# Patient Record
Sex: Female | Born: 1990
Health system: Southern US, Community
[De-identification: ages and names within clinical notes are randomized; demographics above are authoritative.]

## PROBLEM LIST (undated history)

## (undated) DIAGNOSIS — I272 Pulmonary hypertension, unspecified: Secondary | ICD-10-CM

## (undated) DIAGNOSIS — F1191 Opioid use, unspecified, in remission: Secondary | ICD-10-CM

## (undated) DIAGNOSIS — R0602 Shortness of breath: Secondary | ICD-10-CM

## (undated) DIAGNOSIS — I517 Cardiomegaly: Secondary | ICD-10-CM

## (undated) DIAGNOSIS — E042 Nontoxic multinodular goiter: Secondary | ICD-10-CM

## (undated) DIAGNOSIS — F419 Anxiety disorder, unspecified: Secondary | ICD-10-CM

## (undated) DIAGNOSIS — I509 Heart failure, unspecified: Secondary | ICD-10-CM

## (undated) DIAGNOSIS — F1190 Opioid use, unspecified, uncomplicated: Secondary | ICD-10-CM

## (undated) DIAGNOSIS — Z720 Tobacco use: Secondary | ICD-10-CM

## (undated) DIAGNOSIS — N179 Acute kidney failure, unspecified: Secondary | ICD-10-CM

## (undated) DIAGNOSIS — Z8616 Personal history of COVID-19: Secondary | ICD-10-CM

## (undated) DIAGNOSIS — F32A Depression, unspecified: Secondary | ICD-10-CM

## (undated) DIAGNOSIS — R768 Other specified abnormal immunological findings in serum: Secondary | ICD-10-CM

## (undated) DIAGNOSIS — G43909 Migraine, unspecified, not intractable, without status migrainosus: Secondary | ICD-10-CM

## (undated) DIAGNOSIS — K219 Gastro-esophageal reflux disease without esophagitis: Secondary | ICD-10-CM

## (undated) DIAGNOSIS — E039 Hypothyroidism, unspecified: Secondary | ICD-10-CM

## (undated) DIAGNOSIS — F1990 Other psychoactive substance use, unspecified, uncomplicated: Secondary | ICD-10-CM

## (undated) DIAGNOSIS — F1121 Opioid dependence, in remission: Secondary | ICD-10-CM

## (undated) DIAGNOSIS — I445 Left posterior fascicular block: Secondary | ICD-10-CM

## (undated) DIAGNOSIS — R0600 Dyspnea, unspecified: Secondary | ICD-10-CM

## (undated) HISTORY — DX: Cardiomegaly: I51.7

## (undated) HISTORY — DX: Shortness of breath: R06.02

## (undated) HISTORY — DX: Hypothyroidism, unspecified: E03.9

## (undated) HISTORY — DX: Personal history of COVID-19: Z86.16

## (undated) HISTORY — DX: Acute kidney failure, unspecified: N17.9

## (undated) HISTORY — PX: WISDOM TOOTH EXTRACTION: SHX21

## (undated) HISTORY — DX: Other psychoactive substance use, unspecified, uncomplicated: F19.90

## (undated) HISTORY — DX: Opioid use, unspecified, uncomplicated: F11.90

## (undated) HISTORY — DX: Pulmonary hypertension, unspecified: I27.20

## (undated) HISTORY — DX: Heart failure, unspecified: I50.9

---
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
## 2010-02-16 ENCOUNTER — Emergency Department (HOSPITAL_COMMUNITY): Admission: EM | Admit: 2010-02-16 | Discharge: 2010-02-16 | Payer: Self-pay | Admitting: Emergency Medicine

## 2019-06-09 DIAGNOSIS — Z8616 Personal history of COVID-19: Secondary | ICD-10-CM

## 2019-06-09 HISTORY — DX: Personal history of COVID-19: Z86.16

## 2020-06-11 ENCOUNTER — Encounter (HOSPITAL_BASED_OUTPATIENT_CLINIC_OR_DEPARTMENT_OTHER): Payer: Self-pay | Admitting: Emergency Medicine

## 2020-06-11 ENCOUNTER — Other Ambulatory Visit (HOSPITAL_BASED_OUTPATIENT_CLINIC_OR_DEPARTMENT_OTHER): Payer: Self-pay | Admitting: Emergency Medicine

## 2020-06-11 ENCOUNTER — Other Ambulatory Visit: Payer: Self-pay

## 2020-06-11 ENCOUNTER — Emergency Department (HOSPITAL_BASED_OUTPATIENT_CLINIC_OR_DEPARTMENT_OTHER)
Admission: EM | Admit: 2020-06-11 | Discharge: 2020-06-11 | Disposition: A | Discharge status: Home / Self Care | Payer: Medicaid Other | Attending: Emergency Medicine | Admitting: Emergency Medicine

## 2020-06-11 ENCOUNTER — Emergency Department (HOSPITAL_BASED_OUTPATIENT_CLINIC_OR_DEPARTMENT_OTHER): Payer: Medicaid Other

## 2020-06-11 DIAGNOSIS — I10 Essential (primary) hypertension: Secondary | ICD-10-CM

## 2020-06-11 DIAGNOSIS — F17200 Nicotine dependence, unspecified, uncomplicated: Secondary | ICD-10-CM | POA: Insufficient documentation

## 2020-06-11 DIAGNOSIS — R0602 Shortness of breath: Secondary | ICD-10-CM | POA: Insufficient documentation

## 2020-06-11 DIAGNOSIS — R9431 Abnormal electrocardiogram [ECG] [EKG]: Secondary | ICD-10-CM | POA: Insufficient documentation

## 2020-06-11 DIAGNOSIS — Z79899 Other long term (current) drug therapy: Secondary | ICD-10-CM | POA: Diagnosis not present

## 2020-06-11 LAB — PREGNANCY, URINE: Preg Test, Ur: NEGATIVE

## 2020-06-11 LAB — BASIC METABOLIC PANEL
Anion gap: 13 (ref 5–15)
BUN: 26 mg/dL — ABNORMAL HIGH (ref 6–20)
CO2: 22 mmol/L (ref 22–32)
Calcium: 8.6 mg/dL — ABNORMAL LOW (ref 8.9–10.3)
Chloride: 101 mmol/L (ref 98–111)
Creatinine, Ser: 1.1 mg/dL — ABNORMAL HIGH (ref 0.44–1.00)
GFR, Estimated: >60 mL/min (ref >60)
Glucose, Bld: 98 mg/dL (ref 70–99)
Potassium: 4.6 mmol/L (ref 3.5–5.1)
Sodium: 136 mmol/L (ref 135–145)

## 2020-06-11 LAB — CBC
HCT: 49.6 % — ABNORMAL HIGH (ref 36.0–46.0)
Hemoglobin: 16.4 g/dL — ABNORMAL HIGH (ref 12.0–15.0)
MCH: 29.7 pg (ref 26.0–34.0)
MCHC: 33.1 g/dL (ref 30.0–36.0)
MCV: 89.7 fL (ref 80.0–100.0)
Platelets: 154 10*3/uL (ref 150–400)
RBC: 5.53 MIL/uL — ABNORMAL HIGH (ref 3.87–5.11)
RDW: 13.7 % (ref 11.5–15.5)
WBC: 11.5 10*3/uL — ABNORMAL HIGH (ref 4.0–10.5)
nRBC: 0 % (ref 0.0–0.2)

## 2020-06-11 LAB — TROPONIN I (HIGH SENSITIVITY): Troponin I (High Sensitivity): 10 ng/L (ref <18)

## 2020-06-11 MED ORDER — LOSARTAN POTASSIUM 25 MG PO TABS: 25.0000 mg | ORAL_TABLET | Freq: Every day | ORAL | 0 refills | Status: DC

## 2020-06-11 MED FILL — LOSARTAN POTASSIUM 25 MG TA: 25 | 30 days supply | Qty: 30 | Fill #0

## 2020-06-11 NOTE — Discharge Instructions (Signed)
Please read and follow all provided instructions.  Your diagnoses today include:  1. Hypertension, unspecified type   2. Shortness of breath   3. Abnormal EKG     Your blood pressure was high today (BP): BP (!) 168/137   Pulse 96   Temp 97.7 F (36.5 C) (Oral)   Resp 18   Ht 5\' 5"  (1.651 m)   Wt 81.6 kg   SpO2 93%   BMI 29.95 kg/m   Tests performed today include:  Vital signs. See below for your results today.  Blood cell counts (white, red, and platelets) Electrolytes  Kidney function test - upper normal EKG - shows changes Chest x-ray - shows possible signs of enlarged blood vessels going to the lungs, no pneumonia or fluid build-up  Medications prescribed:   Lisinopril - blood pressure medication  Home care instructions:  Follow any educational materials contained in this packet.  Follow-up instructions: Please follow-up with your primary care provider in the next 7 days for a recheck of your symptoms and blood pressure.    You should also follow-up with the cardiologist listed for evaluation of your shortness of breath.  You do have abnormalities on EKG and chest x-ray which should be further evaluated.   Return instructions:   Please return to the Emergency Department if you experience worsening symptoms.   Return with severe chest pain, abdominal pain, or shortness of breath.   Return with severe headache, focal weakness, numbness, difficulty with speech or vision.  Return with loss of vision or sudden vision change.  Please return if you have any other emergent concerns.  Additional Information:  Your vital signs today were: BP (!) 168/137   Pulse 96   Temp 97.7 F (36.5 C) (Oral)   Resp 18   Ht 5\' 5"  (1.651 m)   Wt 81.6 kg   SpO2 93%   BMI 29.95 kg/m  If your blood pressure (BP) was elevated above 135/85 this visit, please have this repeated by your doctor within one month. --------------

## 2020-06-11 NOTE — ED Triage Notes (Signed)
Reports she went to the dentist and was told her bp was high and she needed to come to the ED.  Reports its been high for quite a while.  Was there for dental pain.  Reports it has been high since having covid last year.  Also reports extreme SOB since having covid.

## 2020-06-11 NOTE — ED Provider Notes (Signed)
[facility] EMERGENCY DEPARTMENT Provider Note   CSN: [id number] Arrival date & time: 06/11/20  1333     History Chief Complaint  Patient presents with  . Hypertension    Zoe Roach is a 29 y.o. female.  Patient with her most history of IVDU, hypothroidism presents the emergency department from her dentist office today for evaluation of high blood pressure.  Patient was there for procedure today and was noted to have a high blood pressure reading.  She was then referred to the emergency department.  Patient denies any acute symptoms today.  Patient does however endorse a history of shortness of breath.  Patient had Covid about 1 year ago.  She did not require hospitalization.  She states about a month after having Covid, she had shortness of breath that is worse with exertion.  This waxes and wanes but is not significantly different or worse today.  She denies associated chest pain.  She states that her blood pressures have been elevated since that time as well.  She has seen a telemedicine doctor who has evaluated the patient and evaluated thyroid.  Patient has not had any blood pressure monitoring or treatment per her report.  No fever, cough, N/V/D.         History reviewed. No pertinent past medical history.  There are no problems to display for this patient.   Past Surgical History:  Procedure Laterality Date  . CESAREAN SECTION       OB History   No obstetric history on file.     No family history on file.  Social History   Tobacco Use  . Smoking status: Current Some Day Smoker  . Smokeless tobacco: Never Used  Substance Use Topics  . Alcohol use: Yes    Comment: occasionally  . 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 Authorizing Provider  amphetamine-dextroamphetamine (ADDERALL) 15 MG tablet Take 1 tablet by mouth  daily. 05/13/20  Yes [provider]  HYDROcodone-acetaminophen (NORCO) 10-325 MG tablet Take 0.5 tablets by mouth 4 (four) times daily as needed for pain. 06/08/20  Yes [provider]  ibuprofen (ADVIL) 800 MG tablet Take 400 mg by mouth 3 (three) times daily. 05/20/20  Yes [provider]  lansoprazole (PREVACID) 15 MG capsule Take 15 mg by mouth daily. 05/24/20  Yes [provider]  levonorgestrel (MIRENA) 20 MCG/24HR IUD 1 each by Intrauterine route once.   Yes [provider]  levothyroxine (SYNTHROID) 25 MCG tablet Take 12.5 mcg by mouth See admin instructions. Take 12.9mcg(0.5 tab) once daily with tablet. Total daily dose=62.87mcg 05/12/20  Yes [provider]  levothyroxine (SYNTHROID) 50 MCG tablet Take 50 mcg by mouth See admin instructions. Take one once daily with 0.5 tablet of the tablet. Total daily dose=62.84mcg 05/24/20  Yes [provider]    Allergies    Patient has no allergy information on record.  Review of Systems   Review of Systems  Constitutional: Negative for diaphoresis and fever.  Eyes: Negative for redness.  Respiratory: Positive for shortness of breath. Negative for cough.   Cardiovascular: Negative for chest pain, palpitations and leg swelling.  Gastrointestinal: Negative for abdominal pain, nausea and vomiting.  Genitourinary: Negative for dysuria.  Musculoskeletal: Negative for back pain and neck pain.  Skin: Negative for rash.  Neurological:  Negative for syncope and light-headedness.  Psychiatric/Behavioral: The patient is not nervous/anxious.     Physical Exam Updated Vital Signs BP (!) 159/129 (BP Location: Right Arm)   Pulse (!) 101   Temp 97.7 F (36.5 C) (Oral)   Resp 18   Ht 5\' 5"  (1.651 m)   Wt 81.6 kg   SpO2 98%   BMI 29.95 kg/m   Physical Exam Vitals and nursing note reviewed.  Constitutional:      General: She is not in acute distress.    Appearance: She  is well-developed.  HENT:     Head: Normocephalic and atraumatic.     Right Ear: External ear normal.     Left Ear: External ear normal.     Nose: Nose normal.  Eyes:     Conjunctiva/sclera: Conjunctivae normal.  Cardiovascular:     Rate and Rhythm: Normal rate and regular rhythm. Occasional extrasystoles are present.    Heart sounds: No murmur heard.      Comments: No murmur. Pulmonary:     Effort: No respiratory distress.     Breath sounds: No wheezing, rhonchi or rales.  Abdominal:     Palpations: Abdomen is soft.     Tenderness: There is no abdominal tenderness. There is no guarding or rebound.  Musculoskeletal:     Cervical back: Normal range of motion and neck supple.     Right lower leg: No edema.     Left lower leg: No edema.  Skin:    General: Skin is warm and dry.     Findings: No rash.  Neurological:     General: No focal deficit present.     Mental Status: She is alert. Mental status is at baseline.     Motor: No weakness.  Psychiatric:        Mood and Affect: Mood normal.     ED Results / Procedures / Treatments   Labs (all labs ordered are listed, but only abnormal results are displayed) Labs Reviewed  BASIC METABOLIC PANEL - Abnormal; Notable for the following components:      Result Value   BUN 26 (*)    Creatinine, Ser 1.10 (*)    Calcium 8.6 (*)    All other components within normal limits  CBC - Abnormal; Notable for the following components:   WBC 11.5 (*)    RBC 5.53 (*)    Hemoglobin 16.4 (*)    HCT 49.6 (*)    All other components within normal limits  PREGNANCY, URINE  TROPONIN I (HIGH SENSITIVITY)  TROPONIN I (HIGH SENSITIVITY)    ED ECG REPORT   Date: 06/11/2020  Rate: 105  Rhythm: sinus tachycardia, PAC  QRS Axis: right  Intervals: normal  ST/T Wave abnormalities: nonspecific ST/T changes  Conduction Disutrbances:none  Narrative Interpretation:   Old EKG Reviewed: none available  I have personally reviewed the EKG tracing  and agree with the computerized printout as noted.   Radiology DG Chest 2 View  Result Date: 06/11/2020 CLINICAL DATA:  Elevated blood pressure for a few days, hypertension EXAM: CHEST - 2 VIEW COMPARISON:  None FINDINGS: Upper normal heart size. Enlargement of LEFT hilum/AP window, favor central pulmonary arterial enlargement though difficult to completely exclude LEFT hilar adenopathy. Mediastinal contours otherwise normal. Peribronchial thickening. No infiltrate, pleural effusion, or pneumothorax. Osseous structures unremarkable. IMPRESSION: Enlargement of LEFT hilum/AP window, favor representing central pulmonary arterial enlargement question pulmonary arterial hypertension, though difficult to completely exclude LEFT hilar adenopathy. Consider CT chest  to exclude hilar adenopathy. Electronically Signed   By: Ulyses Southward M.D.   On: 06/11/2020 14:15    Procedures Procedures (including critical care time)  Medications Ordered in ED Medications - No data to display  ED Course  I have reviewed the triage vital signs and the nursing notes.  Pertinent labs & imaging results that were available during my care of the patient were reviewed by me and considered in my medical decision making (see chart for details).  Patient seen and examined.  She is here today due to her blood pressure being high at the dentist office.  Patient does have other concerning symptoms which have been ongoing for almost a year which she will need to have evaluated.  I discussed results with patient at bedside including chest x-ray abnormalities, EKG abnormalities.  Her symptoms are bothersome, but not acutely worsening.  I do not feel that she is having any acute endorgan damage which would require further evaluation at this time.  I will ambulate the patient to ensure that she does not become hypoxic with ambulation given her exertional shortness of breath.  We discussed at length need to follow-up with PCP to continue  titration of blood pressure medication and management.  I also think given abnormal EKG, signs of right ventricular hypertrophy and right heart strain she may benefit from cardiology evaluation and possible echocardiogram if they feel that this is warranted.  Vital signs reviewed and are as follows: BP (!) 159/129 (BP Location: Right Arm)   Pulse (!) 101   Temp 97.7 F (36.5 C) (Oral)   Resp 18   Ht 5\' 5"  (1.651 m)   Wt 81.6 kg   SpO2 98%   BMI 29.95 kg/m   3:59 PM She ambulated with O2 sat of 92%.   Plan for d/c as above.      MDM Rules/Calculators/A&P                          Patient here for BP elevation today. Sounds like this has been ongoing for about a year. Initiated tx with lisinopril today, will need PCP follow-up.  SOB, chronic for past year. Findings as above. This is not acute today. Do not feel this is due to pulmonary edema or fluid overload. Would likely benefit for cards/PCP eval given hypertrophy suggested on EKG and possible pulmonary artery hypertension noted on chest x-ray.   Final Clinical Impression(s) / ED Diagnoses Final diagnoses:  Hypertension, unspecified type  Shortness of breath  Abnormal EKG    Rx / DC Orders ED Discharge Orders         Ordered    losartan (COZAAR) 25 MG tablet  Daily        06/11/20 1556           06/13/20, PA-C 06/11/20 1605    06/13/20, MD 06/13/20 1504

## 2020-06-11 NOTE — ED Notes (Signed)
Ambulatory pulse ox reading baseline 95% HR 98, Decreased to 92% HR 100, no acute distress, pt does report mild increase in work of breathing

## 2020-06-15 ENCOUNTER — Other Ambulatory Visit: Payer: Self-pay

## 2020-06-15 ENCOUNTER — Ambulatory Visit: Payer: Medicaid Other | Admitting: Cardiology

## 2020-06-15 ENCOUNTER — Encounter: Payer: Self-pay | Admitting: Cardiology

## 2020-06-15 VITALS — BP 142/110 | HR 105 | Ht 65.0 in | Wt 190.0 lb

## 2020-06-15 DIAGNOSIS — I10 Essential (primary) hypertension: Secondary | ICD-10-CM

## 2020-06-15 DIAGNOSIS — R079 Chest pain, unspecified: Secondary | ICD-10-CM | POA: Diagnosis not present

## 2020-06-15 DIAGNOSIS — R9431 Abnormal electrocardiogram [ECG] [EKG]: Secondary | ICD-10-CM

## 2020-06-15 MED ORDER — DILTIAZEM HCL ER COATED BEADS 180 MG PO CP24: 180.0000 mg | ORAL_CAPSULE | Freq: Every day | ORAL | 3 refills | Status: DC

## 2020-06-15 NOTE — Addendum Note (Signed)
Addended by: Theresia Majors on: 06/15/2020 02:36 PM   Modules accepted: Orders

## 2020-06-15 NOTE — Patient Instructions (Signed)
Medication Instructions:  Your physician has recommended you make the following change in your medication:  1) START taking diltiazem 180 mg daily  *If you need a refill on your cardiac medications before your next appointment, please call your pharmacy*   Lab Work: TODAY: TSH, renin, PRA, aldosterone, 24 hour urine If you have labs (blood work) drawn today and your tests are completely normal, you will receive your results only by: Marland Kitchen MyChart Message (if you have MyChart) OR . A paper copy in the mail If you have any lab test that is abnormal or we need to change your treatment, we will call you to review the results.   Testing/Procedures: Your physician has requested that you have an echocardiogram. Echocardiography is a painless test that uses sound waves to create images of your heart. It provides your doctor with information about the size and shape of your heart and how well your heart's chambers and valves are working. This procedure takes approximately one hour. There are no restrictions for this procedure.  Your physician has requested that you have a CTA scan.   Your physician has requested that you have a renal artery duplex. During this test, an ultrasound is used to evaluate blood flow to the kidneys. Allow one hour for this exam. Do not eat after midnight the day before and avoid carbonated beverages. Take your medications as you usually do.  Follow-Up: At [facility], you and your health needs are our priority.  As part of our continuing mission to provide you with exceptional heart care, we have created designated Provider Care Teams.  These Care Teams include your primary Cardiologist (physician) and Advanced Practice Providers (APPs -  Physician Assistants and Nurse Practitioners) who all work together to provide you with the care you need, when you need it.  We recommend signing up for the patient portal called "MyChart".  Sign up information is provided on this After  Visit Summary.  MyChart is used to connect with patients for Virtual Visits (Telemedicine).  Patients are able to view lab/test results, encounter notes, upcoming appointments, etc.  Non-urgent messages can be sent to your provider as well.   To learn more about what you can do with MyChart, go to [email or web address].    Follow up with APP in 1 week.

## 2020-06-15 NOTE — Addendum Note (Signed)
Addended by: Tonita Phoenix on: 06/15/2020 02:49 PM   Modules accepted: Orders

## 2020-06-15 NOTE — Addendum Note (Signed)
Addended by: Theresia Majors on: 06/15/2020 02:08 PM   Modules accepted: Orders

## 2020-06-15 NOTE — Progress Notes (Signed)
Cardiology Office Note    Date:  06/15/2020   ID:  Zoe Roach, DOB 1990-11-27, MRN [id number]  PCP:  Zoe Able, MD  Cardiologist:  Zoe Magic, MD   Chief Complaint  Patient presents with  . New Patient (Initial Visit)    abnormal EKG and HTN    History of Present Illness:  Zoe Roach is a 29 y.o. female who is being seen today for the evaluation of HTN and abnormal EKG at the request of Zoe Able, MD.  This is a 30yo female with a hx of IVDU, hypothyroidism and COVID 19 who is referred for evaluation of HTN. She was seen in the ER after she was at the dentist office and was dx with elevated Bp and sent to the ER for further evaluation.  She was completely asymptomatic at the time. BP was 159/154mmHg.  Cxray showed possible pulmonary HTN vs .hilar adenopathy. Her EKG showed NSR with RVH and right heart strain and is referred to Cardiology for further evaluation.  Labs showed a mildly elevated SCr at 1.10, WBC 11.5 and Hbg 16.4.  She was started on Losartan 25mg  daily but after 2 doses her face swelled up.  She tells me that she has had problems with DOE for the past year ever since having COVID 19 a year ago and her SOB has persisted.  She never sought medical attention for the COVID.  Over the past few weeks her SOB has gotten much worse and her BP has been persistently elevated.  She has noticed some pressure in her chest but it feels more like her lungs when her breathing gets labored.  She has had some LE edema and hand edema but may related to diet.  Recently she has had a few episodes of PND and orthopnea in the last few nights.  She says that her heart seems to beat fast around 100bpm all the time.   No dizziness or syncope. She has been extremely fatigued recently as well.    Past Medical History:  Diagnosis Date  . History of COVID-19   . Hypothyroidism (acquired)   . IVDU (intravenous drug user)   . SOB (shortness of breath)     Past Surgical History:    Procedure Laterality Date  . CESAREAN SECTION      Current Medications: Current Meds  Medication Sig  . HYDROcodone-acetaminophen (NORCO) 10-325 MG tablet Take 0.5 tablets by mouth 4 (four) times daily as needed for pain.  ibuprofen (ADVIL) 800 MG tablet Take 400 mg by mouth 3 (three) times daily.  . lansoprazole (PREVACID) 15 MG capsule Take 15 mg by mouth daily.  Marland Kitchen levonorgestrel (MIRENA) 20 MCG/24HR IUD 1 each by Intrauterine route once.  Marland Kitchen levothyroxine (SYNTHROID) 25 MCG tablet Take 12.5 mcg by mouth See admin instructions. Take 12.34mcg(0.5 tab) once daily with 4m tablet. Total daily dose=62.27mcg  . levothyroxine (SYNTHROID) 50 MCG tablet Take 50 mcg by mouth See admin instructions. Take one 4m once daily with 0.5 tablet of the tablet. Total daily dose=62.65mcg    Allergies:   Losartan   Social History   Socioeconomic History  . Marital status: Married    Spouse name: Not on file  . Number of children: Not on file  . Years of education: Not on file  . Highest education level: Not on file  Occupational History  . Not on file  Tobacco Use  . Smoking status: Current Some Day Smoker  . Smokeless tobacco:  Never Used  Substance and Sexual Activity  . Alcohol use: Yes    Comment: occasionally  . 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 Sexual activity: Not on file  Other Topics Concern  . Not on file  Social History Narrative  . Not on file   Social Determinants of Health   Financial Resource Strain:   . Difficulty of Paying Living Expenses: Not on file  Food Insecurity:   . Worried About Programme researcher, broadcasting/film/video in the Last Year: Not on file  . Ran Out of Food in the Last Year: Not on file  Transportation Needs:   . Lack of Transportation (Medical): Not on file  . Lack of Transportation (Non-Medical): Not on file  Physical Activity:   . Days of Exercise per Week: Not on file  . Minutes of Exercise per Session: Not on file   Stress:   . Feeling of Stress : Not on file  Social Connections:   . Frequency of Communication with Friends and Family: Not on file  . Frequency of Social Gatherings with Friends and Family: Not on file  . Attends Religious Services: Not on file  . Active Member of Clubs or Organizations: Not on file  . Attends Banker Meetings: Not on file  . Marital Status: Not on file     Family History:  The patient's family history is not on file.   ROS:   Please see the history of present illness.    ROS All other systems reviewed and are negative.  No flowsheet data found.     PHYSICAL EXAM:   VS:  BP (!) 142/110   Pulse (!) 105   Ht 5\' 5"  (1.651 m)   Wt 190 lb (86.2 kg)   SpO2 96%   BMI 31.62 kg/m    GEN: Well nourished, well developed, in no acute distress  HEENT: normal  Neck: no JVD, carotid bruits, or masses Cardiac: regular but tachycardic; no murmurs, rubs, or gallops,no edema.  Intact distal pulses bilaterally.  Respiratory:  clear to auscultation bilaterally, normal work of breathing GI: soft, nontender, nondistended, + BS MS: no deformity or atrophy  Skin: warm and dry, no rash Neuro:  Alert and Oriented x 3, Strength and sensation are intact Psych: euthymic mood, full affect  Wt Readings from Last 3 Encounters:  06/15/20 190 lb (86.2 kg)  06/11/20 180 lb (81.6 kg)      Studies/Labs Reviewed:   EKG:  EKG is not ordered today.   Recent Labs: 06/11/2020: BUN 26; Creatinine, Ser 1.10; Hemoglobin 16.4; Platelets 154; Potassium 4.6; Sodium 136   Lipid Panel No results found for: CHOL, TRIG, HDL, CHOLHDL, VLDL, LDLCALC, LDLDIRECT  Additional studies/ records that were reviewed today include:  OV notes from PCP  ASSESSMENT:    1. Benign essential HTN   2. Nonspecific abnormal electrocardiogram (ECG) (EKG)      PLAN:  In order of problems listed above:  1. HTN -poorly controlled -she had been on Adderall but is off this now -check  TSH -check for secondary causes of given her young age including renal dopplers to rule out RAS, 24 hour urine for catecholamines/metanephrines/VMA/dopamine/cortisol -check aldosterone, renin and PRA -start Cardizem CD 180mg  daily>>will avoid BB for now while HTN workup in progress which could affect some of the results.  Also cannot use ARB/ACE I due to hx of facial edema with Losartan  2.  Abnormal EKG -EKG in ER showed RVH with nonspecific ST abnormaity -Cxray showed possible enlarged pulmonary artery with pulmonary HTN -check 2D echo to assess LV and RV size and function and rule out pulmonary HTN  3.  SOB -unclear etiology but has been persistent since COVID 19 a year ago and now worse along with fatigue and persistent tachycardia -I am concerned about possible PE with tachycardia and worsening SOB -check stat Chest CTA given her recent abnormal Cxray with possible PHTN vs. Hilar adenopathy -her WBC was mildly elevated and along with possible hilar adenopathy and severe fatigue, need to rule out more worrisome dx such as lymphoma   Medication Adjustments/Labs and Tests Ordered: Current medicines are reviewed at length with the patient today.  Concerns regarding medicines are outlined above.  Medication changes, Labs and Tests ordered today are listed in the Patient Instructions below.  There are no Patient Instructions on file for this visit.   Signed, Zoe Magic, MD  06/15/2020 1:28 PM    [facility] [address] [place], [place], [place]  [address] Phone: [phone]; Fax: [phone]

## 2020-06-15 NOTE — Addendum Note (Signed)
Addended by: Theresia Majors on: 06/15/2020 02:48 PM   Modules accepted: Orders

## 2020-06-16 ENCOUNTER — Ambulatory Visit (HOSPITAL_COMMUNITY)
Admission: RE | Admit: 2020-06-16 | Discharge: 2020-06-16 | Disposition: A | Discharge status: Home / Self Care | Payer: Medicaid Other | Source: Ambulatory Visit | Attending: Cardiology | Admitting: Cardiology

## 2020-06-16 ENCOUNTER — Telehealth: Payer: Self-pay | Admitting: Cardiology

## 2020-06-16 DIAGNOSIS — R079 Chest pain, unspecified: Secondary | ICD-10-CM | POA: Insufficient documentation

## 2020-06-16 MED ORDER — IOHEXOL 350 MG/ML SOLN
75.0000 mL | Freq: Once | INTRAVENOUS | Status: AC | PRN
  Administered 2020-06-16: 75 mL via INTRAVENOUS

## 2020-06-16 NOTE — Telephone Encounter (Signed)
I spoke with patient. She took Cardizem last evening around 5 PM.  At 10:30 PM she noticed swelling in her legs from thighs to ankles.  This morning the left side of her face was swollen.  Facial swelling is similar to what she had with losartan.  Swelling in legs is gone this morning.  Facial swelling is improving. She reports she is also having problems with tooth on her left side.   I told patient to stop Cardizem and that I would send to Dr Mayford Knife for recommendations.  She continues to have shortness of breath but it is not worse today than usual. I reviewed TSH and CT results with patient

## 2020-06-16 NOTE — Telephone Encounter (Signed)
Pt c/o medication issue:  1. Name of Medication: diltiazem (CARDIZEM CD) 180 MG 24 hr capsule  2. How are you currently taking this medication (dosage and times per day)? 1 capsule (180 mg total) by mouth   3. Are you having a reaction (difficulty breathing--STAT)? Yes  4. What is your medication issue? Patient states medication prescribed yesterday has caused both of her legs and the left side of her face to become severely swollen. Patient states she has not been more SOB than usual and no additional symptoms.

## 2020-06-16 NOTE — Telephone Encounter (Signed)
Left message to call office

## 2020-06-16 NOTE — Telephone Encounter (Signed)
Has she gotten the 24 hour urine to sample yet

## 2020-06-17 ENCOUNTER — Other Ambulatory Visit: Payer: Self-pay | Admitting: *Deleted

## 2020-06-17 DIAGNOSIS — I10 Essential (primary) hypertension: Secondary | ICD-10-CM

## 2020-06-17 DIAGNOSIS — R079 Chest pain, unspecified: Secondary | ICD-10-CM

## 2020-06-17 DIAGNOSIS — R9431 Abnormal electrocardiogram [ECG] [EKG]: Secondary | ICD-10-CM

## 2020-06-17 MED ORDER — METOPROLOL SUCCINATE ER 25 MG PO TB24: 25.0000 mg | ORAL_TABLET | Freq: Every day | ORAL | 6 refills | Status: DC

## 2020-06-17 NOTE — Telephone Encounter (Signed)
I spoke with patient. She will be returning 24 hour urine later today. Patient reports she continues to have swelling. Swelling in legs increased as day went on yesterday. Went down over night. This morning she has increased facial swelling. Left side greater than right. She is seeing endodontist later today. Patient states she weighed herself a week prior to seeing Dr Mayford Knife and her weight was 180 lbs.  Weight was 190 lbs when she saw Dr Mayford Knife.  She does not weigh daily at home.  I asked her to start weighing every AM in the same amount of clothes after going to the bathroom.  I advised her to keep record of readings and call office if she gained 3 lbs in a day or 5 lbs in a week.

## 2020-06-17 NOTE — Telephone Encounter (Signed)
Stop amlodipine and start Toprol XL 25mg  daily and check BP and HR daily for a week and call with results.  Facial swelling likely related to tooth, leg swelling likely side effect of amlodipine and will likely resolve after stopping amlodipine

## 2020-06-17 NOTE — Telephone Encounter (Signed)
Sorry meant cardizem

## 2020-06-17 NOTE — Telephone Encounter (Signed)
Follow Up:     Pt is returning Pat's call from yesterday.

## 2020-06-17 NOTE — Telephone Encounter (Signed)
Amlodipine is not on med list.  She stopped cardizem yesterday due to possible cause of swelling

## 2020-06-17 NOTE — Telephone Encounter (Signed)
Patient notified. She has already stopped cardizem.  Will send Toprol prescription to [facility] on [address]

## 2020-06-18 ENCOUNTER — Inpatient Hospital Stay (HOSPITAL_COMMUNITY)
Admission: EM | Admit: 2020-06-18 | Discharge: 2020-06-23 | DRG: 286 | Disposition: A | Discharge status: Home / Self Care | Payer: Medicaid Other | Attending: Cardiology | Admitting: Cardiology

## 2020-06-18 ENCOUNTER — Ambulatory Visit (HOSPITAL_BASED_OUTPATIENT_CLINIC_OR_DEPARTMENT_OTHER): Payer: Medicaid Other

## 2020-06-18 ENCOUNTER — Encounter (HOSPITAL_COMMUNITY): Payer: Self-pay | Admitting: Emergency Medicine

## 2020-06-18 ENCOUNTER — Other Ambulatory Visit: Payer: Self-pay

## 2020-06-18 ENCOUNTER — Inpatient Hospital Stay: Admission: AD | Admit: 2020-06-18 | Payer: Medicaid Other | Source: Ambulatory Visit | Admitting: Cardiology

## 2020-06-18 DIAGNOSIS — K0889 Other specified disorders of teeth and supporting structures: Secondary | ICD-10-CM | POA: Diagnosis present

## 2020-06-18 DIAGNOSIS — I509 Heart failure, unspecified: Secondary | ICD-10-CM

## 2020-06-18 DIAGNOSIS — R079 Chest pain, unspecified: Secondary | ICD-10-CM | POA: Insufficient documentation

## 2020-06-18 DIAGNOSIS — I10 Essential (primary) hypertension: Secondary | ICD-10-CM | POA: Insufficient documentation

## 2020-06-18 DIAGNOSIS — L509 Urticaria, unspecified: Secondary | ICD-10-CM | POA: Diagnosis present

## 2020-06-18 DIAGNOSIS — F1121 Opioid dependence, in remission: Secondary | ICD-10-CM | POA: Diagnosis present

## 2020-06-18 DIAGNOSIS — R0602 Shortness of breath: Secondary | ICD-10-CM

## 2020-06-18 DIAGNOSIS — Z79890 Hormone replacement therapy (postmenopausal): Secondary | ICD-10-CM

## 2020-06-18 DIAGNOSIS — Z6829 Body mass index (BMI) 29.0-29.9, adult: Secondary | ICD-10-CM | POA: Diagnosis not present

## 2020-06-18 DIAGNOSIS — I272 Pulmonary hypertension, unspecified: Secondary | ICD-10-CM

## 2020-06-18 DIAGNOSIS — F17210 Nicotine dependence, cigarettes, uncomplicated: Secondary | ICD-10-CM | POA: Diagnosis present

## 2020-06-18 DIAGNOSIS — E039 Hypothyroidism, unspecified: Secondary | ICD-10-CM | POA: Diagnosis present

## 2020-06-18 DIAGNOSIS — Z8616 Personal history of COVID-19: Secondary | ICD-10-CM | POA: Diagnosis not present

## 2020-06-18 DIAGNOSIS — I50810 Right heart failure, unspecified: Secondary | ICD-10-CM

## 2020-06-18 DIAGNOSIS — I2721 Secondary pulmonary arterial hypertension: Secondary | ICD-10-CM | POA: Diagnosis present

## 2020-06-18 DIAGNOSIS — N179 Acute kidney failure, unspecified: Secondary | ICD-10-CM | POA: Diagnosis present

## 2020-06-18 DIAGNOSIS — I5031 Acute diastolic (congestive) heart failure: Secondary | ICD-10-CM | POA: Diagnosis present

## 2020-06-18 DIAGNOSIS — I110 Hypertensive heart disease with heart failure: Principal | ICD-10-CM | POA: Diagnosis present

## 2020-06-18 DIAGNOSIS — E669 Obesity, unspecified: Secondary | ICD-10-CM | POA: Diagnosis present

## 2020-06-18 DIAGNOSIS — B1920 Unspecified viral hepatitis C without hepatic coma: Secondary | ICD-10-CM | POA: Diagnosis present

## 2020-06-18 DIAGNOSIS — F17200 Nicotine dependence, unspecified, uncomplicated: Secondary | ICD-10-CM | POA: Diagnosis present

## 2020-06-18 DIAGNOSIS — R9431 Abnormal electrocardiogram [ECG] [EKG]: Secondary | ICD-10-CM | POA: Insufficient documentation

## 2020-06-18 DIAGNOSIS — Z79899 Other long term (current) drug therapy: Secondary | ICD-10-CM | POA: Diagnosis not present

## 2020-06-18 HISTORY — DX: Heart failure, unspecified: I50.9

## 2020-06-18 HISTORY — DX: Pulmonary hypertension, unspecified: I27.20

## 2020-06-18 LAB — CBC
HCT: 47.8 % — ABNORMAL HIGH (ref 36.0–46.0)
HCT: 46.8 % — ABNORMAL HIGH (ref 36.0–46.0)
Hemoglobin: 15 g/dL (ref 12.0–15.0)
Hemoglobin: 15.1 g/dL — ABNORMAL HIGH (ref 12.0–15.0)
MCH: 28.9 pg (ref 26.0–34.0)
MCH: 29.4 pg (ref 26.0–34.0)
MCHC: 31.4 g/dL (ref 30.0–36.0)
MCHC: 32.3 g/dL (ref 30.0–36.0)
MCV: 92.1 fL (ref 80.0–100.0)
MCV: 91.1 fL (ref 80.0–100.0)
Platelets: 141 10*3/uL — ABNORMAL LOW (ref 150–400)
Platelets: 143 10*3/uL — ABNORMAL LOW (ref 150–400)
RBC: 5.19 MIL/uL — ABNORMAL HIGH (ref 3.87–5.11)
RBC: 5.14 MIL/uL — ABNORMAL HIGH (ref 3.87–5.11)
RDW: 13.9 % (ref 11.5–15.5)
RDW: 13.9 % (ref 11.5–15.5)
WBC: 8.5 10*3/uL (ref 4.0–10.5)
WBC: 8.6 10*3/uL (ref 4.0–10.5)
nRBC: 0 % (ref 0.0–0.2)
nRBC: 0 % (ref 0.0–0.2)

## 2020-06-18 LAB — BASIC METABOLIC PANEL
Anion gap: 8 (ref 5–15)
BUN: 19 mg/dL (ref 6–20)
CO2: 25 mmol/L (ref 22–32)
Calcium: 8.6 mg/dL — ABNORMAL LOW (ref 8.9–10.3)
Chloride: 106 mmol/L (ref 98–111)
Creatinine, Ser: 1.09 mg/dL — ABNORMAL HIGH (ref 0.44–1.00)
GFR, Estimated: >60 mL/min (ref >60)
Glucose, Bld: 102 mg/dL — ABNORMAL HIGH (ref 70–99)
Potassium: 4.8 mmol/L (ref 3.5–5.1)
Sodium: 139 mmol/L (ref 135–145)

## 2020-06-18 LAB — ECHOCARDIOGRAM COMPLETE
Area-P 1/2: 5.75 cm2
S' Lateral: 3 cm

## 2020-06-18 LAB — TROPONIN I (HIGH SENSITIVITY)
Troponin I (High Sensitivity): 8 ng/L (ref <18)
Troponin I (High Sensitivity): 9 ng/L (ref <18)

## 2020-06-18 LAB — BRAIN NATRIURETIC PEPTIDE: B Natriuretic Peptide: 905.3 pg/mL — ABNORMAL HIGH (ref 0.0–100.0)

## 2020-06-18 LAB — CREATININE, SERUM
Creatinine, Ser: 1.06 mg/dL — ABNORMAL HIGH (ref 0.44–1.00)
GFR, Estimated: >60 mL/min

## 2020-06-18 LAB — I-STAT BETA HCG BLOOD, ED (MC, WL, AP ONLY): I-stat hCG, quantitative: <5 m[IU]/mL (ref <5)

## 2020-06-18 LAB — T4, FREE: Free T4: 0.83 ng/dL (ref 0.61–1.12)

## 2020-06-18 LAB — HIV ANTIBODY (ROUTINE TESTING W REFLEX): HIV Screen 4th Generation wRfx: NONREACTIVE

## 2020-06-18 MED ORDER — ACETAMINOPHEN 325 MG PO TABS
650.0000 mg | ORAL_TABLET | ORAL | Status: DC | PRN
  Administered 2020-06-19 – 2020-06-20 (×2): 650 mg via ORAL
  Filled 2020-06-18 (×2): qty 2

## 2020-06-18 MED ORDER — PANTOPRAZOLE SODIUM 20 MG PO TBEC
20.0000 mg | DELAYED_RELEASE_TABLET | Freq: Every day | ORAL | Status: DC
  Administered 2020-06-19 – 2020-06-23 (×5): 20 mg via ORAL
  Filled 2020-06-18 (×5): qty 1

## 2020-06-18 MED ORDER — SODIUM CHLORIDE 0.9 % IV SOLN: 250.0000 mL | INTRAVENOUS | Status: DC | PRN

## 2020-06-18 MED ORDER — FUROSEMIDE 10 MG/ML IJ SOLN
60.0000 mg | Freq: Two times a day (BID) | INTRAMUSCULAR | Status: DC
  Administered 2020-06-18 – 2020-06-20 (×5): 60 mg via INTRAVENOUS
  Filled 2020-06-18 (×5): qty 6

## 2020-06-18 MED ORDER — HYDRALAZINE HCL 25 MG PO TABS
25.0000 mg | ORAL_TABLET | Freq: Three times a day (TID) | ORAL | Status: DC
  Administered 2020-06-18 (×2): 25 mg via ORAL
  Filled 2020-06-18 (×2): qty 1

## 2020-06-18 MED ORDER — OXYCODONE HCL 5 MG PO TABS: 5.0000 mg | ORAL_TABLET | Freq: Once | ORAL | Status: DC

## 2020-06-18 MED ORDER — SPIRONOLACTONE 25 MG PO TABS
25.0000 mg | ORAL_TABLET | Freq: Every day | ORAL | Status: DC
  Administered 2020-06-18: 25 mg via ORAL
  Filled 2020-06-18: qty 1

## 2020-06-18 MED ORDER — METOPROLOL SUCCINATE ER 25 MG PO TB24: 25.0000 mg | ORAL_TABLET | Freq: Every day | ORAL | Status: DC

## 2020-06-18 MED ORDER — HEPARIN SODIUM (PORCINE) 5000 UNIT/ML IJ SOLN
5000.0000 [IU] | Freq: Three times a day (TID) | INTRAMUSCULAR | Status: DC
  Administered 2020-06-18 – 2020-06-23 (×13): 5000 [IU] via SUBCUTANEOUS
  Filled 2020-06-18 (×13): qty 1

## 2020-06-18 MED ORDER — LEVOTHYROXINE SODIUM 50 MCG PO TABS
62.5000 ug | ORAL_TABLET | Freq: Every day | ORAL | Status: DC
  Administered 2020-06-20 – 2020-06-23 (×4): 62.5 ug via ORAL
  Filled 2020-06-18 (×5): qty 1

## 2020-06-18 MED ORDER — FUROSEMIDE 10 MG/ML IJ SOLN
60.0000 mg | Freq: Two times a day (BID) | INTRAMUSCULAR | Status: DC
Start: 2020-06-18 — End: 2020-06-18

## 2020-06-18 MED ORDER — HYDROCODONE-ACETAMINOPHEN 10-325 MG PO TABS
0.5000 | ORAL_TABLET | Freq: Three times a day (TID) | ORAL | Status: DC | PRN
  Administered 2020-06-18 – 2020-06-19 (×2): 0.5 via ORAL
  Filled 2020-06-18 (×2): qty 1

## 2020-06-18 MED ORDER — LEVOTHYROXINE SODIUM 50 MCG PO TABS: 50.0000 ug | ORAL_TABLET | ORAL | Status: DC

## 2020-06-18 MED ORDER — SODIUM CHLORIDE 0.9% FLUSH
3.0000 mL | Freq: Two times a day (BID) | INTRAVENOUS | Status: DC
  Administered 2020-06-18 – 2020-06-23 (×7): 3 mL via INTRAVENOUS

## 2020-06-18 MED ORDER — SODIUM CHLORIDE 0.9% FLUSH
3.0000 mL | Freq: Two times a day (BID) | INTRAVENOUS | Status: DC
  Administered 2020-06-19 – 2020-06-23 (×9): 3 mL via INTRAVENOUS

## 2020-06-18 MED ORDER — SODIUM CHLORIDE 0.9% FLUSH: 3.0000 mL | INTRAVENOUS | Status: DC | PRN

## 2020-06-18 NOTE — Progress Notes (Signed)
Mother Zoe Roach called me ( I have treaded several of her family members) to check on her. I have reviewed her records for social purposes only.    Yates Decamp, MD, [facility] 06/18/2020, 5:14 PM Office: [phone] Pager: [phone]

## 2020-06-18 NOTE — H&P (Addendum)
Advanced Heart Failure Team History and Physical Note   PCP:  Leilani Able, MD  PCP-Cardiology: Armanda Magic, MD    AHFC: New (Dr. 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  She was initially placed on losartan for systemic hypertension but this was discontinued due to facial swelling concerning for angioedema.    She was seen earlier this week for new patient consultation by Dr. Mayford Knife 06/15/2020.  At visit, patient complained of persistent dyspnea that has been present over the last year, following her COVID-19 infection.  She reports that over the last several weeks her dyspnea has gradually worsened.  Her blood pressure was also markedly elevated at 142/110.  She was started on Cardizem CD 180 mg daily and ordered to undergo additional testing to rule out secondary causes of hypertension given her young age. Renal dopplers have been ordered to r/o RAS,  along w/ 24 hour urine for catecholamines/metanephrines/VMA/dopamine/cortisol. Aldosterone, renin and PRA pending. She was also ordered to get an echocardiogram as well as a chest CT for further work-up for suspected pulmonary hypertension.  Chest CT done 11/3 was negative for PE but did show enlargement of the pulmonary artery as well as enlarged RV.  There is also suggestion of a background of mosaic attenuation of the lungs, which may represent air trapping or small airway disease. 2D echo was completed earlier today showing severely enlarged RV w/ moderately reduced systolic dysfunction. RVSP markedly elevated at 108.7 mmHg. LVEF normal 50-55%.    Unfortunately, she endorsed additional facial swelling w/ Cardizem and this was discontinued on 11/3 and she was started on Toprol XL (it is felt that perhaps swelling is due to tooth swelling after recent dental work and perhaps not medication side effects).   She now presents to the ED for worsening dyspnea on exertion. NYHA Class III.  Initial BP in triage markedly elevated at 166/132. Labs pending. She had a CXR 10/29 that showed no frank edema. Apparently, she had been on Adderall x 3 months but reports she quit taking ~1 week ago.   2D Echo 06/18/20 The right ventricular size is severely enlarged. Right ventricular systolic function is moderately reduced. There is severely elevated pulmonary artery systolic pressure with severely enlarged PA. The estimated right ventricular systolic pressure is 108.7 mmHg. 2. Left ventricular ejection fraction, by estimation, is 50 to 55%. The left ventricle has low normal function. The left ventricle has no regional wall motion abnormalities. There is mild concentric left ventricular hypertrophy. Left ventricular diastolic parameters are consistent with Grade II diastolic dysfunction (pseudonormalization). There is the interventricular septum is flattened in systole and diastole, consistent with right ventricular pressure and volume overload. 3. It appears that the RV pressure and volume overload is causing significant septal shift with resultant underfilling of the LV. 4. Right atrial size was severely dilated with continuous bowing of the interatrial septum towards the LA consistent with very elevated RAP in the setting of moderate-to-severe TR. 5. The mitral valve is normal in structure. Trivial mitral regurgitation. 6. The aortic valve is tricuspid. Aortic valve regurgitation is not visualized. 7. PA is severely dilated measuring 4.0cm. 8. The  inferior vena cava is dilated in size with <50% respiratory variability, suggesting right atrial pressure  of 15 mmHg 9. Findings discussed with primary Cardiologist who had already referred the patient to the ED for further management    Review of Systems: [y] = yes, [ ]  = no   General: Weight gain [ ] ; Weight loss [ ] ; Anorexia [ ] ; Fatigue [ ] ; Fever [ ] ; Chills [ ] ; Weakness [ ]   Cardiac: Chest pain/pressure [ ] ; Resting SOB [Y ]; Exertional SOB [Y ]; Orthopnea [ ] ; Pedal Edema [ ] ; Palpitations [ ] ; Syncope [ ] ; Presyncope [ ] ; Paroxysmal nocturnal dyspnea[ ]   Pulmonary: Cough [ ] ; Wheezing[ ] ; Hemoptysis[ ] ; Sputum [ ] ; Snoring [ ]   GI: Vomiting[ ] ; Dysphagia[ ] ; Melena[ ] ; Hematochezia [ ] ; Heartburn[ ] ; Abdominal pain [ ] ; Constipation [ ] ; Diarrhea [ ] ; BRBPR [ ]   GU: Hematuria[ ] ; Dysuria [ ] ; Nocturia[ ]   Vascular: Pain in legs with walking [ ] ; Pain in feet with lying flat [ ] ; Non-healing sores [ ] ; Stroke [ ] ; TIA [ ] ; Slurred speech [ ] ;  Neuro: Headaches[ ] ; Vertigo[ ] ; Seizures[ ] ; Paresthesias[ ] ;Blurred vision [ ] ; Diplopia [ ] ; Vision changes [ ]   Ortho/Skin: Arthritis [ ] ; Joint pain [ ] ; Muscle pain [ ] ; Joint swelling [ ] ; Back Pain [ ] ; Rash [ ]   Psych: Depression[ ] ; Anxiety[ ]   Heme: Bleeding problems [ ] ; Clotting disorders [ ] ; Anemia [ ]   Endocrine: Diabetes [ ] ; Thyroid dysfunction[ ]    Home Medications Prior to Admission medications   Medication Sig Start Date End Date Taking? Authorizing Provider  amphetamine-dextroamphetamine (ADDERALL) 15 MG tablet Take 1 tablet by mouth daily. Patient not taking: Reported on 06/15/2020 05/13/20   [provider]  HYDROcodone-acetaminophen (NORCO) 10-325 MG tablet Take 0.5 tablets by mouth 4 (four) times daily as needed for pain. 06/08/20   [provider]  ibuprofen (ADVIL) 800 MG tablet Take 400 mg by mouth 3 (three) times daily. 05/20/20   [provider]  lansoprazole (PREVACID) 15 MG capsule Take 15 mg by mouth daily. 05/24/20   [provider]  levonorgestrel (MIRENA) 20 MCG/24HR IUD 1  each by Intrauterine route once.    [provider]  levothyroxine (SYNTHROID) 25 MCG tablet Take 12.5 mcg by mouth See admin instructions. Take 12.60mcg(0.5 tab) once daily with tablet. Total daily dose=62.55mcg 05/12/20   [provider]  levothyroxine (SYNTHROID) 50 MCG tablet Take 50 mcg by mouth See admin instructions. Take one once daily with 0.5 tablet of the tablet. Total daily dose=62.61mcg 05/24/20   [provider]  losartan (COZAAR) 25 MG tablet Take 1 tablet (25 mg total) by mouth daily. Patient not taking: Reported on 06/15/2020 06/11/20   Renne Crigler, PA-C  metoprolol succinate (TOPROL XL) 25 MG 24 hr tablet Take 1 tablet (25 mg total) by mouth daily. 06/17/20   Quintella Reichert, MD    Past Medical History: Past Medical History:  Diagnosis Date  . History of COVID-19   . Hypothyroidism (acquired)   . IVDU (intravenous drug user)   . SOB (shortness of breath)     Past Surgical History: Past Surgical History:  Procedure Laterality Date  . CESAREAN SECTION      Family History:  Family History  Problem Relation Age of Onset  . Hypertension Paternal Grandfather   . Hypertension Mother     Social History: Social History   Socioeconomic History  .  Marital status: Married    Spouse name: Not on file  . Number of children: Not on file  . Years of education: Not on file  . Highest education level: Not on file  Occupational History  . Not on file  Tobacco Use  . Smoking status: Current Some Day Smoker  . Smokeless tobacco: Never Used  Substance and Sexual Activity  . Alcohol use: Yes    Comment: occasionally  . 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 Sexual activity: Not on file  Other Topics Concern  . Not on file  Social History Narrative  . Not on file   Social Determinants of Health   Financial Resource Strain:   . Difficulty of Paying Living Expenses: Not on file  Food Insecurity:    . Worried About Programme researcher, broadcasting/film/video in the Last Year: Not on file  . Ran Out of Food in the Last Year: Not on file  Transportation Needs:   . Lack of Transportation (Medical): Not on file  . Lack of Transportation (Non-Medical): Not on file  Physical Activity:   . Days of Exercise per Week: Not on file  . Minutes of Exercise per Session: Not on file  Stress:   . Feeling of Stress : Not on file  Social Connections:   . Frequency of Communication with Friends and Family: Not on file  . Frequency of Social Gatherings with Friends and Family: Not on file  . Attends Religious Services: Not on file  . Active Member of Clubs or Organizations: Not on file  . Attends Banker Meetings: Not on file  . Marital Status: Not on file    Allergies:  Allergies  Allergen Reactions  . Almond (Diagnostic) Hives  . Cherry Hives  . Losartan Swelling  . Metoprolol Rash    Objective:    Vital Signs:   Temp:  [97.9 F (36.6 C)] 97.9 F (36.6 C) (11/05 1426) Pulse Rate:  [93-94] 93 (11/05 1528) Resp:  [18] 18 (11/05 1528) BP: (166-169)/(115-132) 169/115 (11/05 1528) SpO2:  [95 %-96 %] 95 % (11/05 1528)   There were no vitals filed for this visit.   Physical Exam     General:  fatigue appearing, moderately obese young female. No respiratory difficulty HEENT: Normal, mild left  eye swelling  Neck: Supple. JVD elevated. Carotids 2+ bilat; no bruits. No lymphadenopathy or thyromegaly appreciated. Cor: PMI nondisplaced. Regular rate & rhythm. No rubs, gallops or murmurs. Lungs: Clear Abdomen: Soft, nontender, nondistended. No hepatosplenomegaly. No bruits or masses. Good bowel sounds. Extremities: No cyanosis, clubbing, trace bilateral LE edema Neuro: Alert & oriented x 3, cranial nerves grossly intact. moves all 4 extremities w/o difficulty. Affect pleasant.   Telemetry   NSR   EKG   NSR 99 bpm w/ RVH   Labs     Basic Metabolic Panel: Recent Labs  Lab  06/18/20 1455  NA 139  K 4.8  CL 106  CO2 25  GLUCOSE 102*  BUN 19  CREATININE 1.09*  CALCIUM 8.6*    Liver Function Tests: No results for input(s): AST, ALT, ALKPHOS, BILITOT, PROT, ALBUMIN in the last 168 hours. No results for input(s): LIPASE, AMYLASE in the last 168 hours. No results for input(s): AMMONIA in the last 168 hours.  CBC: Recent Labs  Lab 06/18/20 1455  WBC 8.5  HGB 15.0  HCT 47.8*  MCV 92.1  PLT 141*  Cardiac Enzymes: No results for input(s): CKTOTAL, CKMB, CKMBINDEX, TROPONINI in the last 168 hours.  BNP: BNP (last 3 results) No results for input(s): BNP in the last 8760 hours.  ProBNP (last 3 results) No results for input(s): PROBNP in the last 8760 hours.   CBG: No results for input(s): GLUCAP in the last 168 hours.  Coagulation Studies: No results for input(s): LABPROT, INR in the last 72 hours.  Imaging: ECHOCARDIOGRAM COMPLETE  Result Date: 06/18/2020    ECHOCARDIOGRAM REPORT   Patient Name:   Zoe Roach Date of Exam: 06/18/2020 Medical Rec #:  [id number]         Height:       65.0 in Accession #:    [id number]        Weight:       190.0 lb Date of Birth:  April 19, 1991         BSA:          1.935 m Patient Age:    29 years          BP:           142/110 mmHg Patient Gender: F                 HR:           95 bpm. Exam Location:  [address] Procedure: 2D Echo, Cardiac Doppler and Color Doppler Indications:    R06.02  History:        Patient has no prior history of Echocardiogram examinations.                 Abnormal ECG, LE edema, Signs/Symptoms:Shortness of Breath; Risk                 Factors:Hypertension.  Sonographer:    Samule Ohm RDCS Referring Phys: [phone] TRACI R TURNER IMPRESSIONS  1. The right ventricular size is severely enlarged. Right ventricular systolic function is moderately reduced. There is severely elevated pulmonary artery systolic pressure with severely enlarged PA. The estimated right ventricular systolic pressure  is 108.7 mmHg.  2. Left ventricular ejection fraction, by estimation, is 50 to 55%. The left ventricle has low normal function. The left ventricle has no regional wall motion abnormalities. There is mild concentric left ventricular hypertrophy. Left ventricular diastolic parameters are consistent with Grade II diastolic dysfunction (pseudonormalization). There is the interventricular septum is flattened in systole and diastole, consistent with right ventricular pressure and volume overload.  3. It appears that the RV pressure and volume overload is causing significant septal shift with resultant underfilling of the LV.  4. Right atrial size was severely dilated with continuous bowing of the interatrial septum towards the LA consistent with very elevated RAP in the setting of moderate-to-severe TR.  5. The mitral valve is normal in structure. Trivial mitral regurgitation.  6. The aortic valve is tricuspid. Aortic valve regurgitation is not visualized.  7. PA is severely dilated measuring 4.0cm.  8. The inferior vena cava is dilated in size with <50% respiratory variability, suggesting right atrial pressure of 15 mmHg  9. Findings discussed with primary Cardiologist who had already referred the patient to the ED for further management Comparison(s): No prior Echocardiogram. FINDINGS  Left Ventricle: Left ventricular ejection fraction, by estimation, is 50 to 55%. The left ventricle has low normal function. The left ventricle has no regional wall motion abnormalities. The left ventricular internal cavity size was normal in size. There is mild concentric left ventricular hypertrophy. The  interventricular septum is flattened in systole and diastole, consistent with right ventricular pressure and volume overload. It appears that the RV pressure and volume overload is causing significant septal shift with resultant underfilling of the LV. Left ventricular diastolic parameters are consistent with Grade II diastolic  dysfunction (pseudonormalization). Right Ventricle: The right ventricular size is severely enlarged. Right ventricular systolic function is moderately reduced. There is severely elevated pulmonary artery systolic pressure. The tricuspid regurgitant velocity is 4.84 m/s, and with an assumed right atrial pressure of 15 mmHg, the estimated right ventricular systolic pressure is 108.7 mmHg. Left Atrium: Left atrial size was normal in size. Right Atrium: Right atrial size was severely dilated. Pericardium: There is no evidence of pericardial effusion. Mitral Valve: The mitral valve is normal in structure. Trivial mitral valve regurgitation. Tricuspid Valve: The tricuspid valve is normal in structure. Tricuspid valve regurgitation is moderate to severe. Aortic Valve: The aortic valve is tricuspid. Aortic valve regurgitation is not visualized. Pulmonic Valve: The pulmonic valve was normal in structure. Pulmonic valve regurgitation is mild. Aorta: The aortic root and ascending aorta are structurally normal, with no evidence of dilitation. Pulmonary Artery: PA is severely dilated measuring 4.0cm. Venous: The inferior vena cava is dilated in size with less than 50% respiratory variability, suggesting right atrial pressure of 15 mmHg. IAS/Shunts: There is left bowing of the interatrial septum, suggestive of elevated right atrial pressure. No atrial level shunt detected by color flow Doppler. Additional Comments: There is a small pleural effusion in the right lateral region.  LEFT VENTRICLE PLAX 2D LVIDd:         4.10 cm  Diastology LVIDs:         3.00 cm  LV e' medial:    6.20 cm/s LV PW:         1.35 cm  LV E/e' medial:  10.2 LV IVS:        1.40 cm  LV e' lateral:   5.55 cm/s LVOT diam:     2.10 cm  LV E/e' lateral: 11.4 LV SV:         34 LV SV Index:   18 LVOT Area:     3.46 cm  RIGHT VENTRICLE             IVC RV S prime:     6.74 cm/s   IVC diam: 2.50 cm TAPSE (M-mode): 0.9 cm RVSP:           108.7 mmHg LEFT ATRIUM              Index       RIGHT ATRIUM            Index LA diam:        3.70 cm 1.91 cm/m  RA Pressure: 15.00 mmHg LA Vol (A2C):   27.9 ml 14.42 ml/m RA Area:     29.70 cm LA Vol (A4C):   22.0 ml 11.37 ml/m RA Volume:   118.00 ml  60.97 ml/m LA Biplane Vol: 25.0 ml 12.92 ml/m  AORTIC VALVE LVOT Vmax:   72.50 cm/s LVOT Vmean:  45.800 cm/s LVOT VTI:    0.099 m  AORTA Ao Root diam: 3.60 cm Ao Asc diam:  3.40 cm MV E velocity: 63.40 cm/s  TRICUSPID VALVE MV A velocity: 45.00 cm/s  TR Peak grad:   93.7 mmHg MV E/A ratio:  1.41        TR Vmax:        484.00 cm/s  Estimated RAP:  15.00 mmHg                            RVSP:           108.7 mmHg                             SHUNTS                            Systemic VTI:  0.10 m                            Systemic Diam: 2.10 cm Laurance Flatten MD Electronically signed by Laurance Flatten MD Signature Date/Time: 06/18/2020/3:43:11 PM    Final      Assessment/Plan   1. Pulmonary HTN w/ Right Heart Strain  - EKG w/ pattern of RVH  - Chest CT w/ enlarged PA c/w PAH and enlarged RV. No evidence of PE but findings suggestive of air trapping or small airway disease>> had COVID 19 infection ~1 year ago w/ persistent residual dyspnea  - 2D echo done today shows severely enlarged RV w/ moderately reduced systolic dysfunction. RVSP markedly elevated at 108.7 mmHg. Mod-severe TR. LVEF normal 50-55%. - She is NYHA Class III w/ volume overload - IV Lasix 60 mg bid  - will need RHC w/ vaso reactivity testing on Monday and PFTs - ? If related to Adderall use (had been taking for the last 3 months but quit ~1 week ago) - also V/Q scan to r/o CTEPH  - outpatient sleep study to r/o OSA given concomitant systemic hypertension  - needs serologic testing to r/o CTD - she is on birth control (has IUD). hCG negative  - check BNP   2. Systemic Hypertension  - concern for secondary hypertension given young age but also possibility of familial hypertension  (reports significant family h/o HTN on maternal side)   - renin/ aldosterone ratio pending  - needs 24 hour urine for catecholamines/metanephrines/VMA/dopamine/cortisol - RA dopplers to r/o RAS/ hyperplasia  - will need outpatient sleep study to assess for underlying OSA - avoid use of ACEi/ARB given facial swelling w/ Losartan. Similar reaction w/ Cardizem - had rash w/ metoprolol  - diuresis per above w/ IV Lasix - start spiro 25 mg daily  - start hydralazine 25 mg tid  - 2D echo shows no evidence of LV dysfunction  - apparently, she was previously on Adderall, but reports no longer taking (quit ~1 week ago)   3. Hypothyroidism - previously prescribed levothyroxine, but TSH markedly elevated at 10. ? Compliance - check Free T3/T4  - order levothyroxine on admit   4. Tricuspid Regurgitation  - mod-severe on echo, likely function from severe RV dilation    Robbie Lis, PA-C 06/18/2020, 4:19 PM  Advanced Heart Failure Team Pager [phone] (M-F; 7a - 4p)  Please contact [facility] for night-coverage after hours (4p -7a ) and weekends on [email or web address]  Patient seen with PA, agree with the above note.   History as outlined above.  In short, patient appears to have had systemic HTN that was untreated for at least a few years.  She seems to have been doing well until last October.  At that time, she developed COVID-19 PNA.  Ever since that time, she has had exertional dyspnea.  This seems to have been slowly progressive.  Over the last few weeks, it has markedly worsened. Currently, she is short of breath walking 20-30 feet (walking around house) and can barely get up stairs.  No lightheadedness/syncope.  No chest pain.  She did not seek medical care until relatively recently.   She was sent to the ER by a dentist due to high blood pressure.  She was given a followup with Dr. Mayford Knife.  She was tried on BP meds => losartan but developed facial swelling, diltiazem but developed facial  swelling, and most recently metoprolol but thinks she got a rash.  Due to dyspnea, echo was ordered. She also had a CTA chest that showed no PE, no ILD.    Echo was done today and I reviewed.  Normal LV size with mild LV hypertrophy, EF 50-55%.  D-shaped septum due to RV pressure/volume overload.  Severely dilated RV with moderately decreased RV systolic function.  PASP 109 mmHg.   No family history of pulmonary hypertension.  Systemic hypertension runs strongly in her family.  She smokes about 2 cigarettes/day.  Denies current drug use but apparently used IV drugs in the past.  She was on Adderall in the past but this was stopped due to HTN.   General: NAD Neck: JVP 14-16 cm no thyromegaly or thyroid nodule.  Lungs: Clear to auscultation bilaterally with normal respiratory effort. CV: Nondisplaced PMI.  Heart regular S1/S2 with loud P2, no S3/S4, no murmur.  Trace ankle edema.  No carotid bruit.  Normal pedal pulses.  Abdomen: Soft, nontender, no hepatosplenomegaly, no distention.  Skin: Intact without lesions or rashes.  Neurologic: Alert and oriented x 3.  Psych: Normal affect. Extremities: No clubbing or cyanosis.  HEENT: Normal.   1. Pulmonary hypertension/RV failure: Echo with normal LV size with mild LV hypertrophy, EF 50-55%,  D-shaped septum due to RV pressure/volume overload, severely dilated RV with moderately decreased RV systolic function,  PASP 109 mmHg, dilated IVC. She has been symptomatic for months, suspect due to gradually worsening RV failure.  Recent CTA chest did not show significant lung pathology and did not show PE.  No diet/weight loss drugs.  I am concerned for pulmonary arterial hypertension causing RV failure, primary pulmonary hypertension is very possible though need to rule out rheumatological diagnoses.  She has no joint pains or abnormal rashes. On exam, she is volume overloaded with NYHA class IIIb symptoms.  - Start Lasix 60 mg IV bid for diuresis and follow  closely.  - Check HIV, anti-SCL70, anti-centromere Ab, ANA, RF.   - Check BNP and TnI.  - V/Q scan to fully rule out chronic PE as cause.  - Eventual sleep study and PFTs.  - RHC after diuresis, aim for Monday morning.  Discussed with patient.  - If she is found to have pulmonary arterial hypertension, which I suspect, she will need to begin aggressive treatment and may even end up needing IV therapy.  2. Systemic HTN: Patient has had systemic HTN for some time.  She has a strong family history of this. She thinks losartan and diltiazem caused facial swelling so has stopped them.  She thinks metoprolol caused a rash.  Concern for secondary HTN given young age (and family history).  - Plasma renin and aldosterone levels sent.  - Will send serum metanephrines and collect urine catecholamines.  - Renal artery dopplers to look for fibromuscular dysplasia.  - For now, will start spironolactone 25 mg daily + hydralazine 25  mg tid.   - Would ideally try her on amlodipine 5 mg daily eventually (not sure she had a true reaction to diltiazem, though I am concerned about possible allergic reaction to losartan).  3. Hypothyroidism: Continue Levoxyl.   Marca Ancona 06/18/2020 5:30 PM

## 2020-06-18 NOTE — ED Notes (Signed)
Pt with raised rash behind knees, inner thighs and upper outer arms "since Metoprolol"

## 2020-06-18 NOTE — ED Notes (Signed)
Dinner Tray Ordered @ 1650.

## 2020-06-18 NOTE — ED Triage Notes (Signed)
Pt arrives to ED via pov she states to be admitted due to recent CT and Echo results which showed possible pulmonary hypertension and an enlarged heart per pt. She states she had covid over 1 year ago and since then has always had some mild shortness of breath.

## 2020-06-19 ENCOUNTER — Inpatient Hospital Stay (HOSPITAL_COMMUNITY): Payer: Medicaid Other

## 2020-06-19 ENCOUNTER — Encounter (HOSPITAL_COMMUNITY): Payer: Self-pay | Admitting: Cardiology

## 2020-06-19 DIAGNOSIS — F1121 Opioid dependence, in remission: Secondary | ICD-10-CM | POA: Diagnosis present

## 2020-06-19 DIAGNOSIS — I701 Atherosclerosis of renal artery: Secondary | ICD-10-CM

## 2020-06-19 DIAGNOSIS — F17200 Nicotine dependence, unspecified, uncomplicated: Secondary | ICD-10-CM | POA: Diagnosis present

## 2020-06-19 DIAGNOSIS — I10 Essential (primary) hypertension: Secondary | ICD-10-CM | POA: Diagnosis not present

## 2020-06-19 DIAGNOSIS — I272 Pulmonary hypertension, unspecified: Secondary | ICD-10-CM | POA: Diagnosis not present

## 2020-06-19 DIAGNOSIS — K0889 Other specified disorders of teeth and supporting structures: Secondary | ICD-10-CM | POA: Diagnosis present

## 2020-06-19 HISTORY — DX: Atherosclerosis of renal artery: I70.1

## 2020-06-19 LAB — RESPIRATORY PANEL BY RT PCR (FLU A&B, COVID)
Influenza A by PCR: NEGATIVE
Influenza B by PCR: NEGATIVE
SARS Coronavirus 2 by RT PCR: NEGATIVE

## 2020-06-19 LAB — BASIC METABOLIC PANEL
Anion gap: 9 (ref 5–15)
BUN: 20 mg/dL (ref 6–20)
CO2: 24 mmol/L (ref 22–32)
Calcium: 8.5 mg/dL — ABNORMAL LOW (ref 8.9–10.3)
Chloride: 104 mmol/L (ref 98–111)
Creatinine, Ser: 1.09 mg/dL — ABNORMAL HIGH (ref 0.44–1.00)
GFR, Estimated: >60 mL/min (ref >60)
Glucose, Bld: 91 mg/dL (ref 70–99)
Potassium: 5.5 mmol/L — ABNORMAL HIGH (ref 3.5–5.1)
Sodium: 137 mmol/L (ref 135–145)

## 2020-06-19 LAB — ANA W/REFLEX IF POSITIVE: Anti Nuclear Antibody (ANA): NEGATIVE

## 2020-06-19 LAB — T3, FREE: T3, Free: 2.3 pg/mL (ref 2.0–4.4)

## 2020-06-19 MED ORDER — HYDRALAZINE HCL 50 MG PO TABS
50.0000 mg | ORAL_TABLET | Freq: Three times a day (TID) | ORAL | Status: DC
  Administered 2020-06-19 – 2020-06-22 (×9): 50 mg via ORAL
  Filled 2020-06-19 (×10): qty 1

## 2020-06-19 MED ORDER — HYDROCODONE-ACETAMINOPHEN 10-325 MG PO TABS: 0.5000 | ORAL_TABLET | Freq: Four times a day (QID) | ORAL | Status: DC

## 2020-06-19 MED ORDER — TECHNETIUM TO 99M ALBUMIN AGGREGATED
4.0000 | Freq: Once | INTRAVENOUS | Status: AC | PRN
  Administered 2020-06-19: 4 via INTRAVENOUS

## 2020-06-19 MED ORDER — HYDROCODONE-ACETAMINOPHEN 5-325 MG PO TABS
1.0000 | ORAL_TABLET | Freq: Four times a day (QID) | ORAL | Status: DC
  Administered 2020-06-19 – 2020-06-23 (×17): 1 via ORAL
  Filled 2020-06-19 (×17): qty 1

## 2020-06-19 MED ORDER — GABAPENTIN 100 MG PO CAPS
200.0000 mg | ORAL_CAPSULE | Freq: Every day | ORAL | Status: DC
  Administered 2020-06-19 – 2020-06-22 (×4): 200 mg via ORAL
  Filled 2020-06-19 (×4): qty 2

## 2020-06-19 NOTE — Progress Notes (Signed)
Renal artery duplex completed. Refer to "CV Proc" under chart review to view preliminary results.  06/19/2020 9:18 AM Eula Fried., MHA, RVT, RDCS, RDMS

## 2020-06-19 NOTE — Progress Notes (Signed)
Dr. Gala Romney sent message this AM requesting IM consult on this patient for assistance with pain management. Relayed request to internal med service. Appreciate their assistance.

## 2020-06-19 NOTE — Progress Notes (Signed)
NURSING PROGRESS NOTE  MARAI TEEHAN [id number] Admission Data: 06/19/2020 6:38 AM Attending Provider: Laurey Morale, MD XBD:[id number], Jocelyn Lamer, MD Code Status: Full   Zoe Roach is a 29 y.o. female patient admitted from ED:  -No acute distress noted.  -No complaints of shortness of breath.  -No complaints of chest pain.   Cardiac Monitoring: Bedside monitoring box # 6 in place. Cardiac monitor yields:normal sinus rhythm.  Blood pressure (!) 158/135, pulse 94, temperature 98.2 F (36.8 C), resp. rate 20, height 5\' 5"  (1.651 m), weight 85.6 kg, SpO2 97 %.   IV Fluids:  IV in place, occlusive dsg intact without redness, IV cath forearm left, condition patent and no redness none.   Allergies:  Almond (diagnostic), Cherry, Diltiazem, Losartan, Peach [prunus persica], and Metoprolol  Past Medical History:   has a past medical history of History of COVID-19, Hypothyroidism (acquired), IVDU (intravenous drug user), and SOB (shortness of breath).  Past Surgical History:   has a past surgical history that includes Cesarean section.  Social History:   reports that she has been smoking. She has never used smokeless tobacco. She reports current alcohol use. She reports current drug use.  Skin: patient has scattered rashes on bilateral legs, arms, knees and on her buttocks. Facial swelling (underneath her eyes). Both due to medication that's been d/c'd  Patient/Family orientated to room. Information packet given to patient/family. Admission inpatient armband information verified with patient/family to include name and date of birth and placed on patient arm. Side rails up x 2, fall assessment and education completed with patient/family. Patient/family able to verbalize understanding of risk associated with falls and verbalized understanding to call for assistance before getting out of bed. Call light within reach. Patient/family able to voice and demonstrate understanding of unit  orientation instructions.    Will continue to evaluate and treat per MD orders.

## 2020-06-19 NOTE — Progress Notes (Signed)
Advanced Heart Failure Rounding Note   Subjective:    Feeling better. Less SOB. Asking about her pain meds.    Objective:   Weight Range:  Vital Signs:   Temp:  [97.5 F (36.4 C)-98.2 F (36.8 C)] 98.2 F (36.8 C) (11/06 0309) Pulse Rate:  [86-98] 94 (11/06 0309) Resp:  [15-25] 20 (11/06 0309) BP: (138-169)/(110-141) 155/124 (11/06 0309) SpO2:  [92 %-100 %] 97 % (11/06 0309) Weight:  [85.2 kg-85.6 kg] 85.6 kg (11/06 0003) Last BM Date: 06/17/20  Weight change: Filed Weights   06/18/20 1941 06/19/20 0003  Weight: 85.2 kg 85.6 kg    Intake/Output:   Intake/Output Summary (Last 24 hours) at 06/19/2020 0419 Last data filed at 06/19/2020 0311 Gross per 24 hour  Intake 480 ml  Output 200 ml  Net 280 ml     Physical Exam: General:  Sitting up in bed . No resp difficulty HEENT: normal Neck: supple. JVP to jaw . Carotids 2+ bilat; no bruits. No lymphadenopathy or thryomegaly appreciated. Cor: PMI nondisplaced. Regular rate & rhythm. 2/6 loud P2 Lungs: clear Abdomen: obese soft, nontender, nondistended. No hepatosplenomegaly. No bruits or masses. Good bowel sounds. Extremities: no cyanosis, clubbing, rash, trace edema Neuro: alert & orientedx3, cranial nerves grossly intact. moves all 4 extremities w/o difficulty. Affect pleasant  Telemetry: Sinus 90s Personally reviewed   Labs: Basic Metabolic Panel: Recent Labs  Lab 06/18/20 1455 06/18/20 1631  NA 139  --   K 4.8  --   CL 106  --   CO2 25  --   GLUCOSE 102*  --   BUN 19  --   CREATININE 1.09* 1.06*  CALCIUM 8.6*  --     Liver Function Tests: No results for input(s): AST, ALT, ALKPHOS, BILITOT, PROT, ALBUMIN in the last 168 hours. No results for input(s): LIPASE, AMYLASE in the last 168 hours. No results for input(s): AMMONIA in the last 168 hours.  CBC: Recent Labs  Lab 06/18/20 1455 06/18/20 1631  WBC 8.5 8.6  HGB 15.0 15.1*  HCT 47.8* 46.8*  MCV 92.1 91.1  PLT 141* 143*    Cardiac  Enzymes: No results for input(s): CKTOTAL, CKMB, CKMBINDEX, TROPONINI in the last 168 hours.  BNP: BNP (last 3 results) Recent Labs    06/18/20 1631  BNP 905.3*    ProBNP (last 3 results) No results for input(s): PROBNP in the last 8760 hours.    Other results:  Imaging: ECHOCARDIOGRAM COMPLETE  Result Date: 06/18/2020    ECHOCARDIOGRAM REPORT   Patient Name:   Zoe Roach Date of Exam: 06/18/2020 Medical Rec #:  [id number]         Height:       65.0 in Accession #:    [id number]        Weight:       190.0 lb Date of Birth:  [birth year]         BSA:          1.935 m Patient Age:    29 years          BP:           142/110 mmHg Patient Gender: F                 HR:           95 bpm. Exam Location:  [address] Procedure: 2D Echo, Cardiac Doppler and Color Doppler Indications:    R06.02  History:  Patient has no prior history of Echocardiogram examinations.                 Abnormal ECG, LE edema, Signs/Symptoms:Shortness of Breath; Risk                 Factors:Hypertension.  Sonographer:    Samule Ohm RDCS Referring Phys: [phone] TRACI R TURNER IMPRESSIONS  1. The right ventricular size is severely enlarged. Right ventricular systolic function is moderately reduced. There is severely elevated pulmonary artery systolic pressure with severely enlarged PA. The estimated right ventricular systolic pressure is 108.7 mmHg.  2. Left ventricular ejection fraction, by estimation, is 50 to 55%. The left ventricle has low normal function. The left ventricle has no regional wall motion abnormalities. There is mild concentric left ventricular hypertrophy. Left ventricular diastolic parameters are consistent with Grade II diastolic dysfunction (pseudonormalization). There is the interventricular septum is flattened in systole and diastole, consistent with right ventricular pressure and volume overload.  3. It appears that the RV pressure and volume overload is causing significant septal shift with  resultant underfilling of the LV.  4. Right atrial size was severely dilated with continuous bowing of the interatrial septum towards the LA consistent with very elevated RAP in the setting of moderate-to-severe TR.  5. The mitral valve is normal in structure. Trivial mitral regurgitation.  6. The aortic valve is tricuspid. Aortic valve regurgitation is not visualized.  7. PA is severely dilated measuring 4.0cm.  8. The inferior vena cava is dilated in size with <50% respiratory variability, suggesting right atrial pressure of 15 mmHg  9. Findings discussed with primary Cardiologist who had already referred the patient to the ED for further management Comparison(s): No prior Echocardiogram. FINDINGS  Left Ventricle: Left ventricular ejection fraction, by estimation, is 50 to 55%. The left ventricle has low normal function. The left ventricle has no regional wall motion abnormalities. The left ventricular internal cavity size was normal in size. There is mild concentric left ventricular hypertrophy. The interventricular septum is flattened in systole and diastole, consistent with right ventricular pressure and volume overload. It appears that the RV pressure and volume overload is causing significant septal shift with resultant underfilling of the LV. Left ventricular diastolic parameters are consistent with Grade II diastolic dysfunction (pseudonormalization). Right Ventricle: The right ventricular size is severely enlarged. Right ventricular systolic function is moderately reduced. There is severely elevated pulmonary artery systolic pressure. The tricuspid regurgitant velocity is 4.84 m/s, and with an assumed right atrial pressure of 15 mmHg, the estimated right ventricular systolic pressure is 108.7 mmHg. Left Atrium: Left atrial size was normal in size. Right Atrium: Right atrial size was severely dilated. Pericardium: There is no evidence of pericardial effusion. Mitral Valve: The mitral valve is normal in  structure. Trivial mitral valve regurgitation. Tricuspid Valve: The tricuspid valve is normal in structure. Tricuspid valve regurgitation is moderate to severe. Aortic Valve: The aortic valve is tricuspid. Aortic valve regurgitation is not visualized. Pulmonic Valve: The pulmonic valve was normal in structure. Pulmonic valve regurgitation is mild. Aorta: The aortic root and ascending aorta are structurally normal, with no evidence of dilitation. Pulmonary Artery: PA is severely dilated measuring 4.0cm. Venous: The inferior vena cava is dilated in size with less than 50% respiratory variability, suggesting right atrial pressure of 15 mmHg. IAS/Shunts: There is left bowing of the interatrial septum, suggestive of elevated right atrial pressure. No atrial level shunt detected by color flow Doppler. Additional Comments: There is a small pleural  effusion in the right lateral region.  LEFT VENTRICLE PLAX 2D LVIDd:         4.10 cm  Diastology LVIDs:         3.00 cm  LV e' medial:    6.20 cm/s LV PW:         1.35 cm  LV E/e' medial:  10.2 LV IVS:        1.40 cm  LV e' lateral:   5.55 cm/s LVOT diam:     2.10 cm  LV E/e' lateral: 11.4 LV SV:         34 LV SV Index:   18 LVOT Area:     3.46 cm  RIGHT VENTRICLE             IVC RV S prime:     6.74 cm/s   IVC diam: 2.50 cm TAPSE (M-mode): 0.9 cm RVSP:           108.7 mmHg LEFT ATRIUM             Index       RIGHT ATRIUM            Index LA diam:        3.70 cm 1.91 cm/m  RA Pressure: 15.00 mmHg LA Vol (A2C):   27.9 ml 14.42 ml/m RA Area:     29.70 cm LA Vol (A4C):   22.0 ml 11.37 ml/m RA Volume:   118.00 ml  60.97 ml/m LA Biplane Vol: 25.0 ml 12.92 ml/m  AORTIC VALVE LVOT Vmax:   72.50 cm/s LVOT Vmean:  45.800 cm/s LVOT VTI:    0.099 m  AORTA Ao Root diam: 3.60 cm Ao Asc diam:  3.40 cm MV E velocity: 63.40 cm/s  TRICUSPID VALVE MV A velocity: 45.00 cm/s  TR Peak grad:   93.7 mmHg MV E/A ratio:  1.41        TR Vmax:        484.00 cm/s                            Estimated  RAP:  15.00 mmHg                            RVSP:           108.7 mmHg                             SHUNTS                            Systemic VTI:  0.10 m                            Systemic Diam: 2.10 cm Laurance FlattenHeather Pemberton MD Electronically signed by Laurance FlattenHeather Pemberton MD Signature Date/Time: 06/18/2020/3:43:11 PM    Final       Medications:     Scheduled Medications: . furosemide  60 mg Intravenous BID  . heparin  5,000 Units Subcutaneous Q8H  . hydrALAZINE  25 mg Oral Q8H  . levothyroxine  62.5 mcg Oral Q0600  . pantoprazole  20 mg Oral Daily  . sodium chloride flush  3 mL Intravenous Q12H  . sodium chloride flush  3 mL Intravenous Q12H  . spironolactone  25 mg  Oral Daily     Infusions: . sodium chloride       PRN Medications:  sodium chloride, acetaminophen, HYDROcodone-acetaminophen, sodium chloride flush   Assessment/Plan:    1. Pulmonary hypertension/RV failure: Echo with normal LV size with mild LV hypertrophy, EF 50-55%,  D-shaped septum due to RV pressure/volume overload, severely dilated RV with moderately decreased RV systolic function,  PASP 109 mmHg, dilated IVC. She has been symptomatic for months, suspect due to gradually worsening RV failure.  Recent CTA chest did not show significant lung pathology and did not show PE.  No diet/weight loss drugs.  I am concerned for pulmonary arterial hypertension causing RV failure, primary pulmonary hypertension is very possible though need to rule out rheumatological diagnoses.  She has no joint pains or abnormal rashes. On exam, she remains volume overloaded with NYHA class IIIb symptoms.  - Volume status improving. Continue  Lasix 60 mg IV bid for diuresis and follow closely.  - Check HIV, anti-SCL70, anti-centromere Ab, ANA, RF.   - Check BNP and TnI.  - V/Q scan to fully rule out chronic PE as cause.  - Eventual sleep study and PFTs.  - RHC after diuresis, aim for Monday morning with Dr. Shirlee Latch - If she is found to have  pulmonary arterial hypertension, which I suspect, she will need to begin aggressive treatment and may even end up needing IV therapy.  2. Systemic HTN: Patient has had systemic HTN for some time.  She has a strong family history of this. She thinks losartan and diltiazem caused facial swelling so has stopped them.  She thinks metoprolol caused a rash.  Concern for secondary HTN given young age (and family history).  - Plasma renin and aldosterone levels sent.  - Will send serum metanephrines and collect urine catecholamines.  - Renal artery dopplers to look for fibromuscular dysplasia.  - For now, will start spironolactone 25 mg daily + increase hydralazine to 50 mg tid.   - Would ideally try her on amlodipine 5 mg daily eventually (not sure she had a true reaction to diltiazem, though I am concerned about possible allergic reaction to losartan).  3. Hypothyroidism: Continue Levoxyl.  4. 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  - will ask [facility] to assist  Length of Stay: 1   Arvilla Meres MD 06/19/2020, 4:19 AM  Advanced Heart Failure Team Pager [phone] (M-F; 7a - 4p)  Please contact [facility] for night-coverage after hours (4p -7a ) and weekends on [email or web address]

## 2020-06-19 NOTE — Plan of Care (Signed)
  Problem: Activity: Goal: Capacity to carry out activities will improve Outcome: Progressing   Problem: Cardiac: Goal: Ability to achieve and maintain adequate cardiopulmonary perfusion will improve Outcome: Progressing   

## 2020-06-19 NOTE — Consult Note (Signed)
Medical Consultation   Zoe Roach  WUX:[id number]  DOB: 1990/11/06  DOA: 06/18/2020  PCP: Leilani Able, MD   Outpatient Specialists: None   Requesting physician: Bensimhon - cardiology  Reason for consultation: Chronic pain  History of Present Illness: Zoe Roach is an 29 y.o. female with h/o IVDA; hypothyroidism; and recently diagnosed HTN who presented to cardiology due to RVH/R heart strain on EKG.  Echo was performed on showed severely enlarged RV with moderately reduced systolic function and marked pulmonary HTN.  She reports that she went to the dentist for dental extraction.  BP was 211/160 without h/o prior personal HTN.  She went to the ER Friday and was referred to cardiology Monday and they sent her here for right-sided heart failure with pulmonary HTN - hereditary vs. Idiopathic.  She has had progressive DOE and is not able to do as much activity.  She did have COVID a year ago.  The dentist gave her Norco 10 mg - taking 1/2 tab q5h prn due to dental pain.  She reports that this is "minimum" to keep the pain under control.  They have been giving it every 8 hours and this has made her unable to get the pain controlled.  She cannot get the dental procedure done until after the heart issue is settled.  Prior to this issue she was taking Ibuprofen.  She saw an endodontist yesterday and he does not think the tooth needs to come out, needs further testing; he suggested that this may be related to nerve pain which will improve spontaneously over time.  She has been taking the Norco for maybe 1-2 weeks.  She is very forthcoming about her h/o heroin dependence.  She quit about 3 1/2 years ago, after attending a year-long program; she is also involved in NA and has a sponsor.  She did abuse Neurontin periodically in the past, remotely.   She has been in touch with her sponsor regarding her need for opiate pain medication at this time and is concerned about this, but  also cannot bear the pain.    Review of Systems:  ROS As per HPI otherwise 10 point review of systems negative.    Past Medical History: Past Medical History:  Diagnosis Date  . History of COVID-19   . Hypothyroidism (acquired)   . IVDU (intravenous drug user)   . SOB (shortness of breath)     Past Surgical History: Past Surgical History:  Procedure Laterality Date  . CESAREAN SECTION       Allergies:   Allergies  Allergen Reactions  . Almond (Diagnostic) Hives  . Cherry Hives  . Diltiazem Other (See Comments)    Swollen face  . Losartan Swelling  . Peach [Prunus Persica] Hives  . Metoprolol Rash     Social History:  reports that she has been smoking. She has a 15.00 pack-year smoking history. She has never used smokeless tobacco. She reports current alcohol use. She reports current drug use.   Family History: Family History  Problem Relation Age of Onset  . Hypertension Paternal Grandfather   . Hypertension Mother       Physical Exam: Vitals:   06/19/20 0309 06/19/20 0500 06/19/20 0825 06/19/20 1150  BP: (!) 155/124 (!) 158/135 (!) 156/126 (!) 136/101  Pulse: 94  85 84  Resp: 20  19 19   Temp: 98.2 F (36.8 C)  TempSrc:      SpO2: 97%  97% 98%  Weight:      Height:        Constitutional: Alert and awake, oriented x3, not in any acute distress. Eyes:  EOMI, irises appear normal, anicteric sclera,  ENMT: external ears and nose appear normal, normal hearing, Lips appear normal, oropharynx mucosa, tongue, posterior pharynx appear normal.  There are 2 temporary fillings along the upper left molars without other evidence of poor dentition or apparent gum disease or swelling  Neck: neck appears normal, no masses, normal ROM, no thyromegaly, no JVD  CVS: S1-S2 clear, no murmur rubs or gallops, no LE edema, normal pedal pulses  Respiratory:  clear to auscultation bilaterally, no wheezing, rales or rhonchi. Respiratory effort normal. No accessory muscle  use.  Abdomen: soft nontender, nondistended Musculoskeletal: : no cyanosis, clubbing or edema noted bilaterally Neuro: Cranial nerves II-XII intact, strength, sensation, reflexes Psych: judgement and insight appear normal, stable mood and affect, mental status Skin: no rashes or lesions or ulcers, no induration or nodules    Data reviewed:  I have personally reviewed the recent labs and imaging studies  Pertinent Labs:   K+ 5.5 COVID/flu negative   Inpatient Medications:   Scheduled Meds: . furosemide  60 mg Intravenous BID  . gabapentin  200 mg Oral QHS  . heparin  5,000 Units Subcutaneous Q8H  . hydrALAZINE  50 mg Oral Q8H  . HYDROcodone-acetaminophen  0.5 tablet Oral Q6H  . levothyroxine  62.5 mcg Oral Q0600  . pantoprazole  20 mg Oral Daily  . sodium chloride flush  3 mL Intravenous Q12H  . sodium chloride flush  3 mL Intravenous Q12H   Continuous Infusions: . sodium chloride       Radiological Exams on Admission: NM Pulmonary Perfusion  Result Date: 06/19/2020 CLINICAL DATA:  Inpatient.  Chronic worsening dyspnea.  Chest pain. EXAM: NUCLEAR MEDICINE PERFUSION LUNG SCAN TECHNIQUE: Perfusion images were obtained in multiple projections after intravenous injection of radiopharmaceutical. Ventilation scans intentionally deferred if perfusion scan and chest x-ray adequate for interpretation during COVID 19 epidemic. RADIOPHARMACEUTICALS:  4.0 mCi Tc-34m MAA IV COMPARISON:  06/16/2020 chest CT angiogram. 06/19/2020 chest radiograph. FINDINGS: No significant wedge-shaped perfusion defects in either lung. Medial left mid lung defect on the posterior view correlates with the enlarged main pulmonary artery contour as seen on recent chest CT. IMPRESSION: No evidence of pulmonary embolism on perfusion only scan. Electronically Signed   By: Delbert Phenix M.D.   On: 06/19/2020 11:47   DG CHEST PORT 1 VIEW  Result Date: 06/19/2020 CLINICAL DATA:  Dyspnea EXAM: PORTABLE CHEST 1 VIEW  COMPARISON:  06/11/2020 chest radiograph. FINDINGS: Stable cardiomediastinal silhouette with top-normal heart size and prominent main pulmonary artery contour. No pneumothorax. No pleural effusion. Lungs appear clear, with no acute consolidative airspace disease and no pulmonary edema. IMPRESSION: Clear lungs. Stable prominent main pulmonary artery contour, correlating with dilated main pulmonary artery is seen on recent chest CT angiogram study. Electronically Signed   By: Delbert Phenix M.D.   On: 06/19/2020 11:48   ECHOCARDIOGRAM COMPLETE  Result Date: 06/18/2020    ECHOCARDIOGRAM REPORT   Patient Name:   Zoe Roach Date of Exam: 06/18/2020 Medical Rec #:  [id number]         Height:       65.0 in Accession #:    [id number]        Weight:       190.0 lb Date of Birth:  1990/12/30         BSA:          1.935 m Patient Age:    29 years          BP:           142/110 mmHg Patient Gender: F                 HR:           95 bpm. Exam Location:  [address] Procedure: 2D Echo, Cardiac Doppler and Color Doppler Indications:    R06.02  History:        Patient has no prior history of Echocardiogram examinations.                 Abnormal ECG, LE edema, Signs/Symptoms:Shortness of Breath; Risk                 Factors:Hypertension.  Sonographer:    Samule OhmWilliam Edwards RDCS Referring Phys: [phone] TRACI R TURNER IMPRESSIONS  1. The right ventricular size is severely enlarged. Right ventricular systolic function is moderately reduced. There is severely elevated pulmonary artery systolic pressure with severely enlarged PA. The estimated right ventricular systolic pressure is 108.7 mmHg.  2. Left ventricular ejection fraction, by estimation, is 50 to 55%. The left ventricle has low normal function. The left ventricle has no regional wall motion abnormalities. There is mild concentric left ventricular hypertrophy. Left ventricular diastolic parameters are consistent with Grade II diastolic dysfunction (pseudonormalization).  There is the interventricular septum is flattened in systole and diastole, consistent with right ventricular pressure and volume overload.  3. It appears that the RV pressure and volume overload is causing significant septal shift with resultant underfilling of the LV.  4. Right atrial size was severely dilated with continuous bowing of the interatrial septum towards the LA consistent with very elevated RAP in the setting of moderate-to-severe TR.  5. The mitral valve is normal in structure. Trivial mitral regurgitation.  6. The aortic valve is tricuspid. Aortic valve regurgitation is not visualized.  7. PA is severely dilated measuring 4.0cm.  8. The inferior vena cava is dilated in size with <50% respiratory variability, suggesting right atrial pressure of 15 mmHg  9. Findings discussed with primary Cardiologist who had already referred the patient to the ED for further management Comparison(s): No prior Echocardiogram. FINDINGS  Left Ventricle: Left ventricular ejection fraction, by estimation, is 50 to 55%. The left ventricle has low normal function. The left ventricle has no regional wall motion abnormalities. The left ventricular internal cavity size was normal in size. There is mild concentric left ventricular hypertrophy. The interventricular septum is flattened in systole and diastole, consistent with right ventricular pressure and volume overload. It appears that the RV pressure and volume overload is causing significant septal shift with resultant underfilling of the LV. Left ventricular diastolic parameters are consistent with Grade II diastolic dysfunction (pseudonormalization). Right Ventricle: The right ventricular size is severely enlarged. Right ventricular systolic function is moderately reduced. There is severely elevated pulmonary artery systolic pressure. The tricuspid regurgitant velocity is 4.84 m/s, and with an assumed right atrial pressure of 15 mmHg, the estimated right ventricular systolic  pressure is 108.7 mmHg. Left Atrium: Left atrial size was normal in size. Right Atrium: Right atrial size was severely dilated. Pericardium: There is no evidence of pericardial effusion. Mitral Valve: The mitral valve is normal in structure. Trivial mitral valve regurgitation. Tricuspid Valve: The tricuspid valve is normal in structure. Tricuspid  valve regurgitation is moderate to severe. Aortic Valve: The aortic valve is tricuspid. Aortic valve regurgitation is not visualized. Pulmonic Valve: The pulmonic valve was normal in structure. Pulmonic valve regurgitation is mild. Aorta: The aortic root and ascending aorta are structurally normal, with no evidence of dilitation. Pulmonary Artery: PA is severely dilated measuring 4.0cm. Venous: The inferior vena cava is dilated in size with less than 50% respiratory variability, suggesting right atrial pressure of 15 mmHg. IAS/Shunts: There is left bowing of the interatrial septum, suggestive of elevated right atrial pressure. No atrial level shunt detected by color flow Doppler. Additional Comments: There is a small pleural effusion in the right lateral region.  LEFT VENTRICLE PLAX 2D LVIDd:         4.10 cm  Diastology LVIDs:         3.00 cm  LV e' medial:    6.20 cm/s LV PW:         1.35 cm  LV E/e' medial:  10.2 LV IVS:        1.40 cm  LV e' lateral:   5.55 cm/s LVOT diam:     2.10 cm  LV E/e' lateral: 11.4 LV SV:         34 LV SV Index:   18 LVOT Area:     3.46 cm  RIGHT VENTRICLE             IVC RV S prime:     6.74 cm/s   IVC diam: 2.50 cm TAPSE (M-mode): 0.9 cm RVSP:           108.7 mmHg LEFT ATRIUM             Index       RIGHT ATRIUM            Index LA diam:        3.70 cm 1.91 cm/m  RA Pressure: 15.00 mmHg LA Vol (A2C):   27.9 ml 14.42 ml/m RA Area:     29.70 cm LA Vol (A4C):   22.0 ml 11.37 ml/m RA Volume:   118.00 ml  60.97 ml/m LA Biplane Vol: 25.0 ml 12.92 ml/m  AORTIC VALVE LVOT Vmax:   72.50 cm/s LVOT Vmean:  45.800 cm/s LVOT VTI:    0.099 m  AORTA  Ao Root diam: 3.60 cm Ao Asc diam:  3.40 cm MV E velocity: 63.40 cm/s  TRICUSPID VALVE MV A velocity: 45.00 cm/s  TR Peak grad:   93.7 mmHg MV E/A ratio:  1.41        TR Vmax:        484.00 cm/s                            Estimated RAP:  15.00 mmHg                            RVSP:           108.7 mmHg                             SHUNTS                            Systemic VTI:  0.10 m  Systemic Diam: 2.10 cm Laurance Flatten MD Electronically signed by Laurance Flatten MD Signature Date/Time: 06/18/2020/3:43:11 PM    Final    VAS US RENAL ARTERY DUPLEX  Result Date: 06/19/2020 ABDOMINAL VISCERAL Indications: Hypertension Comparison Study: No prior study Performing Technologist: Gertie Fey MHA, RDMS, RVT, RDCS  Examination Guidelines: A complete evaluation includes B-mode imaging, spectral Doppler, color Doppler, and power Doppler as needed of all accessible portions of each vessel. Bilateral testing is considered an integral part of a complete examination. Limited examinations for reoccurring indications may be performed as noted.  Duplex Findings: +--------------------+--------+--------+------+--------+ Mesenteric          PSV cm/sEDV cm/sPlaqueComments +--------------------+--------+--------+------+--------+ Aorta Prox             59      10                  +--------------------+--------+--------+------+--------+ Celiac Artery Origin   68                          +--------------------+--------+--------+------+--------+ SMA Proximal           45      11                  +--------------------+--------+--------+------+--------+    +------------------+--------+--------+-------+ Right Renal ArteryPSV cm/sEDV cm/sComment +------------------+--------+--------+-------+ Origin               59      18           +------------------+--------+--------+-------+ Proximal             45      23           +------------------+--------+--------+-------+  Mid                  56      23           +------------------+--------+--------+-------+ Distal               45      20           +------------------+--------+--------+-------+ +-----------------+--------+--------+-------+ Left Renal ArteryPSV cm/sEDV cm/sComment +-----------------+--------+--------+-------+ Origin             133      44           +-----------------+--------+--------+-------+ Proximal            31      13           +-----------------+--------+--------+-------+ Mid                 34      15           +-----------------+--------+--------+-------+ Distal              66      32           +-----------------+--------+--------+-------+ +------------+--------+--------+----+-----------+--------+--------+----+ Right KidneyPSV cm/sEDV cm/sRI  Left KidneyPSV cm/sEDV cm/sRI   +------------+--------+--------+----+-----------+--------+--------+----+ Upper Pole  11      4       0.63Upper Pole 9       3       0.64 +------------+--------+--------+----+-----------+--------+--------+----+ Mid         21      8       0.        19      10      0.49 +------------+--------+--------+----+-----------+--------+--------+----+ Lower Pole  14  5       0.63Lower Pole 13      5       0.62 +------------+--------+--------+----+-----------+--------+--------+----+ Hilar       39      15      0.61Hilar      23      10      0.57 +------------+--------+--------+----+-----------+--------+--------+----+ +------------------+--------+------------------+--------+ Right Kidney              Left Kidney                +------------------+--------+------------------+--------+ RAR                       RAR                        +------------------+--------+------------------+--------+ RAR (manual)      1.0     RAR (manual)      2.25     +------------------+--------+------------------+--------+ Cortex            9/4 cm/sCortex             9/4 cm/s +------------------+--------+------------------+--------+ Cortex thickness          Corex thickness            +------------------+--------+------------------+--------+ Kidney length (cm)11.30   Kidney length (cm)11.40    +------------------+--------+------------------+--------+   Summary: Renal:  Right: No evidence of right renal artery stenosis. RRV flow present. Left:  1-59% stenosis of the left renal artery. LRV flow present.  *See table(s) above for measurements and observations.     Preliminary     Impression/Recommendations Principal Problem:   Pulmonary hypertension, unspecified (HCC) Active Problems:   Pain, dental   History of narcotic addiction (HCC)   Tobacco dependence  Pulmonary HTN -Patient recently diagnosed with severe HTN and subsequently was found to have R heart failure with apparent pulmonary HTN -Dr. Gala Romney is managing and intends to perform R heart cath on Monday -She is being evaluated for a rheumatologic cause as well as for idiopathic disease -Further management per cardiology  HTN -Patient also with apparently undiagnosed HTN -She has a h/o adverse events with medications -Secondary HTN evaluation is under way -She was started on spironolactone and hydralazine by cardiology  Dental pain -I have reviewed this patient in the [place] Controlled Substances Reporting System.  -She is not receiving routine or long-term medications for controlled substances. -She was receiving prescriptions for Adderall in August and September but none recently; would encourage consideration of non-stimulant medication such as Strattera for this in the future if still needed -Otherwise, she was given 3 total prescriptions for dental pain including Norco 10/325 x 20 tabs on 10/26 and 11/2, and 6 tabs of Valium on 11/1 - all due to her acute pain -Her pain currently (with my granted limited knowledge of dental conditions) appears to be more neuropathic in nature - she  had a filling and it caused significant pain and so she went back and had it removed and 2 adjacent temporary fillings placed with resultant significant facial edema -She saw an endodontist yesterday who appears to agree (by the patient's report) that perhaps this led to a nerve irritation that may improve spontaneously over time; she is scheduled to go back to the endodontist, but it does not appear that a new dental consult would be helpful at this time -Instead, will order standing Norco 5/325 (she was taking 10/325 and 1/2 tab, but this will be  half as much APAP) q6h and add Neurontin 200 mg PO qhs to see if this helps her pain and sleep -This is anticipated to be a short-term/limited issue and hopefully she will be able to decrease to prn once her pain is better controlled and wean to off -She is aware of the risk associated with opiates given her h/o dependence but also has an acute pain syndrome that needs to be addressed  H/o opiate dependence -Patient is quite forthcoming about her prior heroin dependence and has been stable in recovery for several years - she is appropriately very proud of this -While this increases her risk of recurrent opiate dependence, it also likely has led to increased tolerance and so a need for more pain medication than someone without h/o dependence -She is having ongoing acute pain and needs this addressed -She is encouraged to stop taking pain medications as soon as she is able -She has good support, including with her NA community  Hypothyroidism -Elevated TSH with normal T3/T4; would consider increasing Synthroid dose but will defer to cardiology at this time -Continue Synthroid at current dose for now  Tobacco dependence -Encourage cessation.  She reports herself as as "occasional" smoker - which means that she has cut down from 1 ppd to a few cigarettes per day -This was discussed with the patient and should be reviewed on an ongoing basis - particularly  in light of her new cardiopulmonary medical problems. -Patch declined    Thank you for this consultation.  Our [facility] hospitalist team will follow the patient with you.   Time Spent: 50 minutes  Jonah Blue M.D. [facility] 06/19/2020, 12:28 PM

## 2020-06-20 ENCOUNTER — Encounter (HOSPITAL_COMMUNITY): Payer: Self-pay | Admitting: Cardiology

## 2020-06-20 DIAGNOSIS — I10 Essential (primary) hypertension: Secondary | ICD-10-CM | POA: Diagnosis present

## 2020-06-20 DIAGNOSIS — I272 Pulmonary hypertension, unspecified: Secondary | ICD-10-CM | POA: Diagnosis not present

## 2020-06-20 DIAGNOSIS — N179 Acute kidney failure, unspecified: Secondary | ICD-10-CM | POA: Diagnosis present

## 2020-06-20 LAB — RAPID URINE DRUG SCREEN, HOSP PERFORMED
Amphetamines: NOT DETECTED
Barbiturates: NOT DETECTED
Benzodiazepines: NOT DETECTED
Cocaine: NOT DETECTED
Opiates: POSITIVE — AB
Tetrahydrocannabinol: NOT DETECTED

## 2020-06-20 LAB — BASIC METABOLIC PANEL
Anion gap: 9 (ref 5–15)
BUN: 18 mg/dL (ref 6–20)
CO2: 28 mmol/L (ref 22–32)
Calcium: 8.7 mg/dL — ABNORMAL LOW (ref 8.9–10.3)
Chloride: 100 mmol/L (ref 98–111)
Creatinine, Ser: 1.2 mg/dL — ABNORMAL HIGH (ref 0.44–1.00)
GFR, Estimated: >60 mL/min (ref >60)
Glucose, Bld: 97 mg/dL (ref 70–99)
Potassium: 4.1 mmol/L (ref 3.5–5.1)
Sodium: 137 mmol/L (ref 135–145)

## 2020-06-20 LAB — TSH: TSH: 10.5 u[IU]/mL — ABNORMAL HIGH (ref 0.450–4.500)

## 2020-06-20 LAB — ALDOSTERONE + RENIN ACTIVITY W/ RATIO
ALDOS/RENIN RATIO: 2.7 (ref 0.0–30.0)
ALDOSTERONE: 2.8 ng/dL (ref 0.0–30.0)
Renin: 1.031 ng/mL/hr (ref 0.167–5.380)

## 2020-06-20 MED ORDER — SODIUM CHLORIDE 0.9% FLUSH: 3.0000 mL | INTRAVENOUS | Status: DC | PRN

## 2020-06-20 MED ORDER — TRAMADOL HCL 50 MG PO TABS
25.0000 mg | ORAL_TABLET | Freq: Once | ORAL | Status: AC
  Administered 2020-06-20: 25 mg via ORAL
  Filled 2020-06-20: qty 1

## 2020-06-20 MED ORDER — ASPIRIN 81 MG PO CHEW
81.0000 mg | CHEWABLE_TABLET | ORAL | Status: AC
  Administered 2020-06-21: 81 mg via ORAL
  Filled 2020-06-20: qty 1

## 2020-06-20 MED ORDER — LACTULOSE 10 GM/15ML PO SOLN
15.0000 g | Freq: Once | ORAL | Status: DC
  Filled 2020-06-20: qty 30

## 2020-06-20 MED ORDER — SODIUM CHLORIDE 0.9 % IV SOLN: 250.0000 mL | INTRAVENOUS | Status: DC | PRN

## 2020-06-20 MED ORDER — SODIUM CHLORIDE 0.9 % IV SOLN: INTRAVENOUS | Status: AC

## 2020-06-20 MED ORDER — HYDROCORTISONE 1 % EX CREA
TOPICAL_CREAM | Freq: Three times a day (TID) | CUTANEOUS | Status: DC | PRN
  Filled 2020-06-20 (×2): qty 28

## 2020-06-20 NOTE — Consult Note (Signed)
.                Medical Consultation   Zoe Roach  STM:[id number]  DOB: 1990-12-02  DOA: 06/18/2020  PCP: Leilani Able, MD   Outpatient Specialists:  None   Requesting physician: Bensimhon - cardiology  Reason for consultation: Chronic pain.   History of Present Illness: Zoe Roach is an 29 y.o. female with h/o IVDA; hypothyroidism; and recently diagnosed HTN who presented to cardiology due to RVH/R heart strain on EKG.  Echo was performed on showed severely enlarged RV with moderately reduced systolic function and marked pulmonary HTN.  She reports that she went to the dentist for dental extraction.  BP was 211/160 without h/o prior personal HTN.  She went to the ER Friday and was referred to cardiology Monday and they sent her here for right-sided heart failure with pulmonary HTN - hereditary vs. Idiopathic.  She has had progressive DOE and is not able to do as much activity.  She did have COVID a year ago.  The dentist gave her Norco 10 mg - taking 1/2 tab q5h prn due to dental pain.  She reports that this is "minimum" to keep the pain under control.  They have been giving it every 8 hours and this has made her unable to get the pain controlled.  She cannot get the dental procedure done until after the heart issue is settled.  Prior to this issue she was taking Ibuprofen.  She saw an endodontist yesterday and he does not think the tooth needs to come out, needs further testing; he suggested that this may be related to nerve pain which will improve spontaneously over time.  She has been taking the Norco for maybe 1-2 weeks.  She is very forthcoming about her h/o heroin dependence.  She quit about 3 1/2 years ago, after attending a year-long program; she is also involved in NA and has a sponsor.  She did abuse Neurontin periodically in the past, remotely.   She has been in touch with her sponsor regarding her need for opiate pain medication at this time and is concerned about  this, but also cannot bear the pain.   Review of Systems:  Review of Systems  HENT: Negative.        TEETH PAIN.  Eyes: Negative.   Respiratory: Negative.   Cardiovascular: Negative.   Skin: Negative.   All other systems reviewed and are negative.  Past Medical History: Past Medical History:  Diagnosis Date  . History of COVID-19   . Hypothyroidism (acquired)   . IVDU (intravenous drug user)   . SOB (shortness of breath)     Past Surgical History: Past Surgical History:  Procedure Laterality Date  . CESAREAN SECTION       Allergies:  Allergies  Allergen Reactions  . Almond (Diagnostic) Hives  . Cherry Hives  . Diltiazem Other (See Comments)    Swollen face  . Losartan Swelling  . Peach [Prunus Persica] Hives  . Metoprolol Rash     Social History:  reports that she has been smoking. She has a 15.00 pack-year smoking history. She has never used smokeless tobacco. She reports current alcohol use. She reports current drug use.   Family History: Family History  Problem Relation Age of Onset  . Hypertension Paternal Grandfather   . Hypertension Mother     Physical Exam: Vitals:   06/20/20 0354 06/20/20 0800 06/20/20 1237 06/20/20 1300  BP: (!) 138/114 (!) 126/105 Marland Kitchen)  130/112 132/89  Pulse: 93 (!) 107  90  Resp: Temp: 97.7 F (36.5 C) 98.1 F (36.7 C)  98.4 F (36.9 C)  TempSrc: Oral Oral  Oral  SpO2: 94% 96%  98%  Weight: 83.3 kg     Height:        Physical Exam Vitals and nursing note reviewed.  HENT:     Head: Normocephalic and atraumatic.  Eyes:     Extraocular Movements: Extraocular movements intact.  Cardiovascular:     Rate and Rhythm: Normal rate and regular rhythm.     Heart sounds: Normal heart sounds.  Pulmonary:     Effort: Pulmonary effort is normal.     Breath sounds: Normal breath sounds.  Abdominal:     Palpations: Abdomen is soft.  Neurological:     Mental Status: She is alert and oriented to person, place, and  time.    Data reviewed:  I have personally reviewed following labs and imaging studies Labs:  CBC: Recent Labs  Lab 06/18/20 1455 06/18/20 1631  WBC 8.5 8.6  HGB 15.0 15.1*  HCT 47.8* 46.8*  MCV 92.1 91.1  PLT 141* 143*    Basic Metabolic Panel: Recent Labs  Lab 06/18/20 1455 06/18/20 1455 06/18/20 1631 06/19/20 0406 06/20/20 0252  NA 139  --   --  137 137  K 4.8   < >  --  5.5* 4.1  CL 106  --   --  104 100  CO2 25  --   --  24 28  GLUCOSE 102*  --   --  91 97  BUN 19  --   --  20 18  CREATININE 1.09*  --  1.06* 1.09* 1.20*  CALCIUM 8.6*  --   --  8.5* 8.7*   < > = values in this interval not displayed.   GFR Estimated Creatinine Clearance: 72.4 mL/min (A) (by C-G formula based on SCr of 1.2 mg/dL (H)). Liver Function Tests: No results for input(s): AST, ALT, ALKPHOS, BILITOT, PROT, ALBUMIN in the last 168 hours. No results for input(s): LIPASE, AMYLASE in the last 168 hours. No results for input(s): AMMONIA in the last 168 hours. Coagulation profile No results for input(s): INR, PROTIME in the last 168 hours.  Cardiac Enzymes: No results for input(s): CKTOTAL, CKMB, CKMBINDEX, TROPONINI in the last 168 hours. BNP: Invalid input(s): POCBNP CBG: No results for input(s): GLUCAP in the last 168 hours. D-Dimer No results for input(s): DDIMER in the last 72 hours. Hgb A1c No results for input(s): HGBA1C in the last 72 hours. Lipid Profile No results for input(s): CHOL, HDL, LDLCALC, TRIG, CHOLHDL, LDLDIRECT in the last 72 hours. Thyroid function studies Recent Labs    06/18/20 1631  T3FREE 2.3   Anemia work up No results for input(s): VITAMINB12, FOLATE, FERRITIN, TIBC, IRON, RETICCTPCT in the last 72 hours. Urinalysis No results found for: COLORURINE, APPEARANCEUR, LABSPEC, PHURINE, GLUCOSEU, HGBUR, BILIRUBINUR, KETONESUR, PROTEINUR, UROBILINOGEN, NITRITE, LEUKOCYTESUR   Sepsis Labs Invalid input(s): PROCALCITONIN,  WBC,   LACTICIDVEN Microbiology Recent Results (from the past 240 hour(s))  Respiratory Panel by RT PCR (Flu A&B, Covid) - Nasopharyngeal Swab     Status: None   Collection Time: 06/19/20 12:02 AM   Specimen: Nasopharyngeal Swab  Result Value Ref Range Status   SARS Coronavirus 2 by RT PCR NEGATIVE NEGATIVE Final    Comment: (NOTE) SARS-CoV-2 target nucleic acids are NOT DETECTED.  The SARS-CoV-2 RNA is generally detectable  in upper respiratoy specimens during the acute phase of infection. The lowest concentration of SARS-CoV-2 viral copies this assay can detect is 131 copies/mL. A negative result does not preclude SARS-Cov-2 infection and should not be used as the sole basis for treatment or other patient management decisions. A negative result may occur with  improper specimen collection/handling, submission of specimen other than nasopharyngeal swab, presence of viral mutation(s) within the areas targeted by this assay, and inadequate number of viral copies (<131 copies/mL). A negative result must be combined with clinical observations, patient history, and epidemiological information. The expected result is Negative.  Fact Sheet for Patients:  https://www.moore.com/  Fact Sheet for Healthcare Providers:  https://www.young.biz/  This test is no t yet approved or cleared by the [place] FDA and  has been authorized for detection and/or diagnosis of SARS-CoV-2 by FDA under an Emergency Use Authorization (EUA). This EUA will remain  in effect (meaning this test can be used) for the duration of the COVID-19 declaration under Section 564(b)(1) of the Act, 21 U.S.C. section 360bbb-3(b)(1), unless the authorization is terminated or revoked sooner.     Influenza A by PCR NEGATIVE NEGATIVE Final   Influenza B by PCR NEGATIVE NEGATIVE Final    Comment: (NOTE) The Xpert Xpress SARS-CoV-2/FLU/RSV assay is intended as an aid in  the diagnosis of  influenza from Nasopharyngeal swab specimens and  should not be used as a sole basis for treatment. Nasal washings and  aspirates are unacceptable for Xpert Xpress SARS-CoV-2/FLU/RSV  testing.  Fact Sheet for Patients: https://www.moore.com/  Fact Sheet for Healthcare Providers: https://www.young.biz/  This test is not yet approved or cleared by the [place] FDA and  has been authorized for detection and/or diagnosis of SARS-CoV-2 by  FDA under an Emergency Use Authorization (EUA). This EUA will remain  in effect (meaning this test can be used) for the duration of the  Covid-19 declaration under Section 564(b)(1) of the Act, 21  U.S.C. section 360bbb-3(b)(1), unless the authorization is  terminated or revoked. Performed at [facility], [address], [place], [place] [address]     Inpatient Medications:   Scheduled Meds: . furosemide  60 mg Intravenous BID  . gabapentin  200 mg Oral QHS  . heparin  5,000 Units Subcutaneous Q8H  . hydrALAZINE  50 mg Oral Q8H  . HYDROcodone-acetaminophen  1 tablet Oral Q6H  . levothyroxine  62.5 mcg Oral Q0600  . pantoprazole  20 mg Oral Daily  . sodium chloride flush  3 mL Intravenous Q12H  . sodium chloride flush  3 mL Intravenous Q12H   Continuous Infusions: . sodium chloride      Radiological Exams on Admission: NM Pulmonary Perfusion  Result Date: 06/19/2020 CLINICAL DATA:  Inpatient.  Chronic worsening dyspnea.  Chest pain. EXAM: NUCLEAR MEDICINE PERFUSION LUNG SCAN TECHNIQUE: Perfusion images were obtained in multiple projections after intravenous injection of radiopharmaceutical. Ventilation scans intentionally deferred if perfusion scan and chest x-ray adequate for interpretation during COVID 19 epidemic. RADIOPHARMACEUTICALS:  4.0 mCi Tc-9m MAA IV COMPARISON:  06/16/2020 chest CT angiogram. 06/19/2020 chest radiograph. FINDINGS: No significant wedge-shaped perfusion defects in  either lung. Medial left mid lung defect on the posterior view correlates with the enlarged main pulmonary artery contour as seen on recent chest CT. IMPRESSION: No evidence of pulmonary embolism on perfusion only scan. Electronically Signed   By: Delbert Phenix M.D.   On: 06/19/2020 11:47   DG CHEST PORT 1 VIEW  Result Date: 06/19/2020  CLINICAL DATA:  Dyspnea EXAM: PORTABLE CHEST 1 VIEW COMPARISON:  06/11/2020 chest radiograph. FINDINGS: Stable cardiomediastinal silhouette with top-normal heart size and prominent main pulmonary artery contour. No pneumothorax. No pleural effusion. Lungs appear clear, with no acute consolidative airspace disease and no pulmonary edema. IMPRESSION: Clear lungs. Stable prominent main pulmonary artery contour, correlating with dilated main pulmonary artery is seen on recent chest CT angiogram study. Electronically Signed   By: Delbert PhenixJason A Poff M.D.   On: 06/19/2020 11:48   VAS US RENAL ARTERY DUPLEX  Result Date: 06/19/2020 ABDOMINAL VISCERAL Indications: Hypertension Comparison Study: No prior study Performing Technologist: Gertie FeyMichelle Simonetti MHA, RDMS, RVT, RDCS  Examination Guidelines: A complete evaluation includes B-mode imaging, spectral Doppler, color Doppler, and power Doppler as needed of all accessible portions of each vessel. Bilateral testing is considered an integral part of a complete examination. Limited examinations for reoccurring indications may be performed as noted.  Duplex Findings: +--------------------+--------+--------+------+--------+ Mesenteric          PSV cm/sEDV cm/sPlaqueComments +--------------------+--------+--------+------+--------+ Aorta Prox             59      10                  +--------------------+--------+--------+------+--------+ Celiac Artery Origin   68                          +--------------------+--------+--------+------+--------+ SMA Proximal           45      11                   +--------------------+--------+--------+------+--------+    +------------------+--------+--------+-------+ Right Renal ArteryPSV cm/sEDV cm/sComment +------------------+--------+--------+-------+ Origin               59      18           +------------------+--------+--------+-------+ Proximal             45      23           +------------------+--------+--------+-------+ Mid                  56      23           +------------------+--------+--------+-------+ Distal               45      20           +------------------+--------+--------+-------+ +-----------------+--------+--------+-------+ Left Renal ArteryPSV cm/sEDV cm/sComment +-----------------+--------+--------+-------+ Origin             133      44           +-----------------+--------+--------+-------+ Proximal            31      13           +-----------------+--------+--------+-------+ Mid                 34      15           +-----------------+--------+--------+-------+ Distal              66      32           +-----------------+--------+--------+-------+ +------------+--------+--------+----+-----------+--------+--------+----+ Right KidneyPSV cm/sEDV cm/sRI  Left KidneyPSV cm/sEDV cm/sRI   +------------+--------+--------+----+-----------+--------+--------+----+ Upper Pole  11      4       0.63Upper Pole 9  3       0.64 +------------+--------+--------+----+-----------+--------+--------+----+ Mid         21      8       0.        19      10      0.49 +------------+--------+--------+----+-----------+--------+--------+----+ Lower Pole  14      5       0.63Lower Pole 13      5       0.62 +------------+--------+--------+----+-----------+--------+--------+----+ Hilar       39      15      0.61Hilar      23      10      0.57 +------------+--------+--------+----+-----------+--------+--------+----+ +------------------+--------+------------------+--------+  Right Kidney              Left Kidney                +------------------+--------+------------------+--------+ RAR                       RAR                        +------------------+--------+------------------+--------+ RAR (manual)      1.0     RAR (manual)      2.25     +------------------+--------+------------------+--------+ Cortex            9/4 cm/sCortex            9/4 cm/s +------------------+--------+------------------+--------+ Cortex thickness          Corex thickness            +------------------+--------+------------------+--------+ Kidney length (cm)11.30   Kidney length (cm)11.40    +------------------+--------+------------------+--------+  Summary: Renal:  Right: No evidence of right renal artery stenosis. RRV flow present. Left:  1-59% stenosis of the left renal artery. LRV flow present.  *See table(s) above for measurements and observations.  Diagnosing physician: Lemar Livings MD  Electronically signed by Lemar Livings MD on 06/19/2020 at 3:30:38 PM.    Final     Impression/Recommendations Principal Problem:   Pulmonary hypertension, unspecified (HCC) -pt to have rt heart cath no h/o diet pills , maybe mild sleep apnea    Pain, dental -cont current pain medicine.    History of narcotic addiction (HCC) -d/w pt about long term complication to be aware about due to drugs abuse, exp nephrology and pulmonary    Tobacco dependence D/w pt about tobacco cessation and its effects on pulmonary, cardiac and nephrology.Marland Kitchen    AKI (acute kidney injury) (HCC) Pt  Lab Results  Component Value Date   CREATININE 1.20 (H) 06/20/2020   CREATININE 1.09 (H) 06/19/2020   CREATININE 1.06 (H) 06/18/2020   Avoid nephrotoxic agent or contrast,    HTN (hypertension) Pt is on lasix and hydralazine. Blood pressure 132/89, pulse 90, temperature 98.4 F (36.9 C), temperature source Oral, resp. rate 20, height 5\' 5"  (1.651 m), weight 83.3 kg, SpO2 98 %. As we see BP is  well controlled. We will continue this regimen and eventually ACEI/ARB When kidney function is back to baseline.   Thank you for this consultation.  Our [facility] hospitalist team will follow the patient with you.   Time Spent:  [facility] M.D. [phone] [facility] 06/20/2020, 2:29 PM

## 2020-06-20 NOTE — Progress Notes (Signed)
° ° °Advanced Heart Failure Rounding Note ° ° °Subjective:   ° °Remains on IV lasix. Over 4L urine out. Weight down 5 pounds. Breathing much better.  HR and BP improved. ° °Says dental pain improving with narcotics but says she is unable to tolerate any weaning  ° °Creatinine 1.07 -> 1.20 ° °ANA and viral panel negative. Other serologies still pending.  ° °Objective:   °Weight Range: ° °Vital Signs:   °Temp:  [97.6 °F (36.4 °C)-98.4 °F (36.9 °C)] 98.1 °F (36.7 °C) (11/07 0800) °Pulse Rate:  [81-107] 107 (11/07 0800) °Resp:  [13-20] 20 (11/07 0800) °BP: (126-151)/(101-124) 126/105 (11/07 0800) °SpO2:  [93 %-98 %] 96 % (11/07 0800) °Weight:  [83.3 kg] 83.3 kg (11/07 0354) °Last BM Date: 06/17/20 ° °Weight change: °Filed Weights  ° 06/18/20 1941 06/19/20 0003 06/20/20 0354  °Weight: 85.2 kg 85.6 kg 83.3 kg  ° ° °Intake/Output:  ° °Intake/Output Summary (Last 24 hours) at 06/20/2020 1119 °Last data filed at 06/20/2020 1016 °Gross per 24 hour  °Intake 1043 ml  °Output 4550 ml  °Net -3507 ml  °  ° °Physical Exam: °General:  Walking room. No resp difficulty °HEENT: normal °Neck: supple. JVP 7 Carotids 2+ bilat; no bruits. No lymphadenopathy or thryomegaly appreciated. °Cor: PMI nondisplaced. Regular mildly tachy 2/6 TR °Lungs: clear °Abdomen: soft, nontender, nondistended. No hepatosplenomegaly. No bruits or masses. Good bowel sounds. °Extremities: no cyanosis, clubbing, rash, edema °Neuro: alert & orientedx3, cranial nerves grossly intact. moves all 4 extremities w/o difficulty. Affect pleasant ° ° °Telemetry: Sinus 90s Personally reviewed ° ° °Labs: °Basic Metabolic Panel: °Recent Labs  °Lab 06/18/20 °1455 06/18/20 °1631 06/19/20 °0406 06/20/20 °0252  °NA 139  --  137 137  °K 4.8  --  5.5* 4.1  °CL 106  --  104 100  °CO2 25  --  24 28  °GLUCOSE 102*  --  91 97  °BUN 19  --  20 18  °CREATININE 1.09* 1.06* 1.09* 1.20*  °CALCIUM 8.6*  --  8.5* 8.7*  ° ° °Liver Function Tests: °No results for input(s): AST, ALT, ALKPHOS,  BILITOT, PROT, ALBUMIN in the last 168 hours. °No results for input(s): LIPASE, AMYLASE in the last 168 hours. °No results for input(s): AMMONIA in the last 168 hours. ° °CBC: °Recent Labs  °Lab 06/18/20 °1455 06/18/20 °1631  °WBC 8.5 8.6  °HGB 15.0 15.1*  °HCT 47.8* 46.8*  °MCV 92.1 91.1  °PLT 141* 143*  ° ° °Cardiac Enzymes: °No results for input(s): CKTOTAL, CKMB, CKMBINDEX, TROPONINI in the last 168 hours. ° °BNP: °BNP (last 3 results) °Recent Labs  °  06/18/20 °1631  °BNP 905.3*  ° ° °ProBNP (last 3 results) °No results for input(s): PROBNP in the last 8760 hours. ° ° ° °Other results: ° °Imaging: °NM Pulmonary Perfusion ° °Result Date: 06/19/2020 °CLINICAL DATA:  Inpatient.  Chronic worsening dyspnea.  Chest pain. EXAM: NUCLEAR MEDICINE PERFUSION LUNG SCAN TECHNIQUE: Perfusion images were obtained in multiple projections after intravenous injection of radiopharmaceutical. Ventilation scans intentionally deferred if perfusion scan and chest x-ray adequate for interpretation during COVID 19 epidemic. RADIOPHARMACEUTICALS:  4.0 mCi Tc-99m MAA IV COMPARISON:  06/16/2020 chest CT angiogram. 06/19/2020 chest radiograph. FINDINGS: No significant wedge-shaped perfusion defects in either lung. Medial left mid lung defect on the posterior view correlates with the enlarged main pulmonary artery contour as seen on recent chest CT. IMPRESSION: No evidence of pulmonary embolism on perfusion only scan. Electronically Signed   By: Jason A Poff   M.D.   On: 06/19/2020 11:47  ° °DG CHEST PORT 1 VIEW ° °Result Date: 06/19/2020 °CLINICAL DATA:  Dyspnea EXAM: PORTABLE CHEST 1 VIEW COMPARISON:  06/11/2020 chest radiograph. FINDINGS: Stable cardiomediastinal silhouette with top-normal heart size and prominent main pulmonary artery contour. No pneumothorax. No pleural effusion. Lungs appear clear, with no acute consolidative airspace disease and no pulmonary edema. IMPRESSION: Clear lungs. Stable prominent main pulmonary artery contour,  correlating with dilated main pulmonary artery is seen on recent chest CT angiogram study. Electronically Signed   By: Jason A Poff M.D.   On: 06/19/2020 11:48  ° °ECHOCARDIOGRAM COMPLETE ° °Result Date: 06/18/2020 °   ECHOCARDIOGRAM REPORT   Patient Name:   Yasmene E Mcilwain Date of Exam: 06/18/2020 Medical Rec #:  [id number]         Height:       65.0 in Accession #:    [id number]        Weight:       190.0 lb Date of Birth:  12/30/1990         BSA:          1.935 m² Patient Age:    29 years          BP:           142/110 mmHg Patient Gender: F                 HR:           95 bpm. Exam Location:  [address] Procedure: 2D Echo, Cardiac Doppler and Color Doppler Indications:    R06.02  History:        Patient has no prior history of Echocardiogram examinations.                 Abnormal ECG, LE edema, Signs/Symptoms:Shortness of Breath; Risk                 Factors:Hypertension.  Sonographer:    William Edwards RDCS Referring Phys: [id number] TRACI R TURNER IMPRESSIONS  1. The right ventricular size is severely enlarged. Right ventricular systolic function is moderately reduced. There is severely elevated pulmonary artery systolic pressure with severely enlarged PA. The estimated right ventricular systolic pressure is 108.7 mmHg.  2. Left ventricular ejection fraction, by estimation, is 50 to 55%. The left ventricle has low normal function. The left ventricle has no regional wall motion abnormalities. There is mild concentric left ventricular hypertrophy. Left ventricular diastolic parameters are consistent with Grade II diastolic dysfunction (pseudonormalization). There is the interventricular septum is flattened in systole and diastole, consistent with right ventricular pressure and volume overload.  3. It appears that the RV pressure and volume overload is causing significant septal shift with resultant underfilling of the LV.  4. Right atrial size was severely dilated with continuous bowing of the interatrial septum  towards the LA consistent with very elevated RAP in the setting of moderate-to-severe TR.  5. The mitral valve is normal in structure. Trivial mitral regurgitation.  6. The aortic valve is tricuspid. Aortic valve regurgitation is not visualized.  7. PA is severely dilated measuring 4.0cm.  8. The inferior vena cava is dilated in size with <50% respiratory variability, suggesting right atrial pressure of 15 mmHg  9. Findings discussed with primary Cardiologist who had already referred the patient to the ED for further management Comparison(s): No prior Echocardiogram. FINDINGS  Left Ventricle: Left ventricular ejection fraction, by estimation, is 50 to 55%. The left ventricle has low normal   function. The left ventricle has no regional wall motion abnormalities. The left ventricular internal cavity size was normal in size. There is mild concentric left ventricular hypertrophy. The interventricular septum is flattened in systole and diastole, consistent with right ventricular pressure and volume overload. It appears that the RV pressure and volume overload is causing significant septal shift with resultant underfilling of the LV. Left ventricular diastolic parameters are consistent with Grade II diastolic dysfunction (pseudonormalization). Right Ventricle: The right ventricular size is severely enlarged. Right ventricular systolic function is moderately reduced. There is severely elevated pulmonary artery systolic pressure. The tricuspid regurgitant velocity is 4.84 m/s, and with an assumed right atrial pressure of 15 mmHg, the estimated right ventricular systolic pressure is 108.7 mmHg. Left Atrium: Left atrial size was normal in size. Right Atrium: Right atrial size was severely dilated. Pericardium: There is no evidence of pericardial effusion. Mitral Valve: The mitral valve is normal in structure. Trivial mitral valve regurgitation. Tricuspid Valve: The tricuspid valve is normal in structure. Tricuspid valve  regurgitation is moderate to severe. Aortic Valve: The aortic valve is tricuspid. Aortic valve regurgitation is not visualized. Pulmonic Valve: The pulmonic valve was normal in structure. Pulmonic valve regurgitation is mild. Aorta: The aortic root and ascending aorta are structurally normal, with no evidence of dilitation. Pulmonary Artery: PA is severely dilated measuring 4.0cm. Venous: The inferior vena cava is dilated in size with less than 50% respiratory variability, suggesting right atrial pressure of 15 mmHg. IAS/Shunts: There is left bowing of the interatrial septum, suggestive of elevated right atrial pressure. No atrial level shunt detected by color flow Doppler. Additional Comments: There is a small pleural effusion in the right lateral region.  LEFT VENTRICLE PLAX 2D LVIDd:         4.10 cm  Diastology LVIDs:         3.00 cm  LV e' medial:    6.20 cm/s LV PW:         1.35 cm  LV E/e' medial:  10.2 LV IVS:        1.40 cm  LV e' lateral:   5.55 cm/s LVOT diam:     2.10 cm  LV E/e' lateral: 11.4 LV SV:         34 LV SV Index:   18 LVOT Area:     3.46 cm²  RIGHT VENTRICLE             IVC RV S prime:     6.74 cm/s   IVC diam: 2.50 cm TAPSE (M-mode): 0.9 cm RVSP:           108.7 mmHg LEFT ATRIUM             Index       RIGHT ATRIUM            Index LA diam:        3.70 cm 1.91 cm/m²  RA Pressure: 15.00 mmHg LA Vol (A2C):   27.9 ml 14.42 ml/m² RA Area:     29.70 cm² LA Vol (A4C):   22.0 ml 11.37 ml/m² RA Volume:   118.00 ml  60.97 ml/m² LA Biplane Vol: 25.0 ml 12.92 ml/m²  AORTIC VALVE LVOT Vmax:   72.50 cm/s LVOT Vmean:  45.800 cm/s LVOT VTI:    0.099 m  AORTA Ao Root diam: 3.60 cm Ao Asc diam:  3.40 cm MV E velocity: 63.40 cm/s  TRICUSPID VALVE MV A velocity: 45.00 cm/s  TR Peak grad:   93.7   mmHg MV E/A ratio:  1.41        TR Vmax:        484.00 cm/s                            Estimated RAP:  15.00 mmHg                            RVSP:           108.7 mmHg                             SHUNTS                             Systemic VTI:  0.10 m                            Systemic Diam: 2.10 cm Heather Pemberton MD Electronically signed by Heather Pemberton MD Signature Date/Time: 06/18/2020/3:43:11 PM    Final   ° °VAS US RENAL ARTERY DUPLEX ° °Result Date: 06/19/2020 °ABDOMINAL VISCERAL Indications: Hypertension Comparison Study: No prior study Performing Technologist: Michelle Simonetti MHA, RDMS, RVT, RDCS  Examination Guidelines: A complete evaluation includes B-mode imaging, spectral Doppler, color Doppler, and power Doppler as needed of all accessible portions of each vessel. Bilateral testing is considered an integral part of a complete examination. Limited examinations for reoccurring indications may be performed as noted.  Duplex Findings: +--------------------+--------+--------+------+--------+  Mesenteric           PSV cm/s EDV cm/s Plaque Comments  +--------------------+--------+--------+------+--------+  Aorta Prox              59       10                     +--------------------+--------+--------+------+--------+  Celiac Artery Origin    68                              +--------------------+--------+--------+------+--------+  SMA Proximal            45       11                     +--------------------+--------+--------+------+--------+    +------------------+--------+--------+-------+  Right Renal Artery PSV cm/s EDV cm/s Comment  +------------------+--------+--------+-------+  Origin                59       18             +------------------+--------+--------+-------+  Proximal              45       23             +------------------+--------+--------+-------+  Mid                   56       23             +------------------+--------+--------+-------+  Distal                45       20             +------------------+--------+--------+-------+ +-----------------+--------+--------+-------+    Left Renal Artery PSV cm/s EDV cm/s Comment  +-----------------+--------+--------+-------+  Origin              133        44             +-----------------+--------+--------+-------+  Proximal             31       13             +-----------------+--------+--------+-------+  Mid                  34       15             +-----------------+--------+--------+-------+  Distal               66       32             +-----------------+--------+--------+-------+ +------------+--------+--------+----+-----------+--------+--------+----+  Right Kidney PSV cm/s EDV cm/s RI   Left Kidney PSV cm/s EDV cm/s RI    +------------+--------+--------+----+-----------+--------+--------+----+  Upper Pole   11       4        0.63 Upper Pole  9        3        0.64  +------------+--------+--------+----+-----------+--------+--------+----+  Mid          21       8        0.64 Mid         19       10       0.49  +------------+--------+--------+----+-----------+--------+--------+----+  Lower Pole   14       5        0.63 Lower Pole  13       5        0.62  +------------+--------+--------+----+-----------+--------+--------+----+  Hilar        39       15       0.61 Hilar       23       10       0.57  +------------+--------+--------+----+-----------+--------+--------+----+ +------------------+--------+------------------+--------+  Right Kidney                Left Kidney                  +------------------+--------+------------------+--------+  RAR                         RAR                          +------------------+--------+------------------+--------+  RAR (manual)       1.0      RAR (manual)       2.25      +------------------+--------+------------------+--------+  Cortex             9/4 cm/s Cortex             9/4 cm/s  +------------------+--------+------------------+--------+  Cortex thickness            Corex thickness              +------------------+--------+------------------+--------+  Kidney length (cm) 11.30    Kidney length (cm) 11.40     +------------------+--------+------------------+--------+  Summary: Renal:  Right: No evidence of right renal  artery stenosis. RRV flow present. Left:  1-59% stenosis of the left renal artery. LRV flow present.  *See table(s)   above for measurements and observations.  Diagnosing physician: Brandon Cain MD  Electronically signed by Brandon Cain MD on 06/19/2020 at 3:30:38 PM.    Final   ° ° ° °Medications:   ° ° °Scheduled Medications: °• furosemide  60 mg Intravenous BID  °• gabapentin  200 mg Oral QHS  °• heparin  5,000 Units Subcutaneous Q8H  °• hydrALAZINE  50 mg Oral Q8H  °• HYDROcodone-acetaminophen  1 tablet Oral Q6H  °• levothyroxine  62.5 mcg Oral Q0600  °• pantoprazole  20 mg Oral Daily  °• sodium chloride flush  3 mL Intravenous Q12H  °• sodium chloride flush  3 mL Intravenous Q12H  ° ° °Infusions: °• sodium chloride    ° ° °PRN Medications: °sodium chloride, acetaminophen, hydrocortisone cream, sodium chloride flush ° ° °Assessment/Plan:  ° ° °1. Pulmonary hypertension/RV failure: Echo with normal LV size with mild LV hypertrophy, EF 50-55%,  D-shaped septum due to RV pressure/volume overload, severely dilated RV with moderately decreased RV systolic function,  PASP 109 mmHg, dilated IVC. She has been symptomatic for months, suspect due to gradually worsening RV failure.  Recent CTA chest did not show significant lung pathology and did not show PE.  No diet/weight loss drugs.  I am concerned for pulmonary arterial hypertension causing RV failure, primary pulmonary hypertension is very possible though need to rule out rheumatological diagnoses.  She has no joint pains or abnormal rashes. BNP 905. HsTn 9. She is diuresing well.  °- Volume status improving. Continue  Lasix 60 mg IV bid for diuresis and follow closely.  °- HIV, viral panel and ANA negative.  °- Anti-SCL70, anti-centromere Ab, ANA, RF are pending.  Will add hepatitis panels in setting of IVDA history.  °- V/Q negative. °- Eventual sleep study and PFTs.  °- Plan RHC tomorrow.  °- If she is found to have pulmonary arterial hypertension, she will need to  begin aggressive treatment and may even end up needing IV therapy.  ° °2. Systemic HTN: Patient has had systemic HTN for some time.  She has a strong family history of this. She thinks losartan and diltiazem caused facial swelling so has stopped them.  She thinks metoprolol caused a rash.  Concern for secondary HTN given young age (and family history). Systolics much improved but diastolic BP remains quite high.  °- Plasma renin (normal) and aldosterone (pending) levels sent.  °- Serum metanephrines sent. 24 hour urine for catecholamines in process.  °- Renal artery dopplers 06/19/20 to look for fibromuscular dysplasia. LRAS 1-59%  O/w normal °- Spiro stopped due to hyperkalemia °- Continue hydralazine 50 mg tid.   °- Add low dose carvedilol after cath if output ok.  °- Would ideally try her on amlodipine 5 mg daily eventually (not sure she had a true reaction to diltiazem, though I am concerned about possible allergic reaction to losartan).  ° °3. Hypothyroidism: Continue Levoxyl.  ° °4. 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  °- appreciate TRH's help °-  She is very defensive about any wean in her pain meds and I am concerned about ongoing drug-seeking behavior. Boyfriend in room this with her am and dynamic gave me some concern.  °- I am going to repeat UDS although will clearly be positive for narcotics  ° °Length of Stay: 2 ° ° °Azrael Huss MD °06/20/2020, 11:19 AM ° °Advanced Heart Failure Team °Pager [phone] (M-F; 7a - 4p)  °Please contact [facility] for night-coverage after hours (

## 2020-06-20 NOTE — Progress Notes (Signed)
MD, pt may need a out patient  sleep study, pt appears to have sleep apnea, I have put O2 on her at night due to this, will continue to monitor, Thanks Lavonda Jumbo RN.

## 2020-06-20 NOTE — Progress Notes (Signed)
Patient continues to complain of discomfort from oxygen use, specifically in her sinuses, causing a significant headache. Order received for one time dose of tramadol and then had respiratory bring a humidifier and simple O2 mask, to see if less direct delivery of oxygen into the nasal passages improves her discomfort.

## 2020-06-20 NOTE — Progress Notes (Signed)
Patient refused the lactulose one time dose and is requesting a stool softener only. Will notify MD

## 2020-06-20 NOTE — H&P (View-Only) (Signed)
Advanced Heart Failure Rounding Note   Subjective:    Remains on IV lasix. Over 4L urine out. Weight down 5 pounds. Breathing much better.  HR and BP improved.  Says dental pain improving with narcotics but says she is unable to tolerate any weaning   Creatinine 1.07 -> 1.20  ANA and viral panel negative. Other serologies still pending.   Objective:   Weight Range:  Vital Signs:   Temp:  [97.6 F (36.4 C)-98.4 F (36.9 C)] 98.1 F (36.7 C) (11/07 0800) Pulse Rate:  [81-107] 107 (11/07 0800) Resp:  [13-20] 20 (11/07 0800) BP: (126-151)/(101-124) 126/105 (11/07 0800) SpO2:  [93 %-98 %] 96 % (11/07 0800) Weight:  [83.3 kg] 83.3 kg (11/07 0354) Last BM Date: 06/17/20  Weight change: Filed Weights   06/18/20 1941 06/19/20 0003 06/20/20 0354  Weight: 85.2 kg 85.6 kg 83.3 kg    Intake/Output:   Intake/Output Summary (Last 24 hours) at 06/20/2020 1119 Last data filed at 06/20/2020 1016 Gross per 24 hour  Intake 1043 ml  Output 4550 ml  Net -3507 ml     Physical Exam: General:  Walking room. No resp difficulty HEENT: normal Neck: supple. JVP 7 Carotids 2+ bilat; no bruits. No lymphadenopathy or thryomegaly appreciated. Cor: PMI nondisplaced. Regular mildly tachy 2/6 TR Lungs: clear Abdomen: soft, nontender, nondistended. No hepatosplenomegaly. No bruits or masses. Good bowel sounds. Extremities: no cyanosis, clubbing, rash, edema Neuro: alert & orientedx3, cranial nerves grossly intact. moves all 4 extremities w/o difficulty. Affect pleasant   Telemetry: Sinus 90s Personally reviewed   Labs: Basic Metabolic Panel: Recent Labs  Lab 06/18/20 1455 06/18/20 1631 06/19/20 0406 06/20/20 0252  NA 139  --  137 137  K 4.8  --  5.5* 4.1  CL 106  --  104 100  CO2 25  --  24 28  GLUCOSE 102*  --  91 97  BUN 19  --  20 18  CREATININE 1.09* 1.06* 1.09* 1.20*  CALCIUM 8.6*  --  8.5* 8.7*    Liver Function Tests: No results for input(s): AST, ALT, ALKPHOS,  BILITOT, PROT, ALBUMIN in the last 168 hours. No results for input(s): LIPASE, AMYLASE in the last 168 hours. No results for input(s): AMMONIA in the last 168 hours.  CBC: Recent Labs  Lab 06/18/20 1455 06/18/20 1631  WBC 8.5 8.6  HGB 15.0 15.1*  HCT 47.8* 46.8*  MCV 92.1 91.1  PLT 141* 143*    Cardiac Enzymes: No results for input(s): CKTOTAL, CKMB, CKMBINDEX, TROPONINI in the last 168 hours.  BNP: BNP (last 3 results) Recent Labs    06/18/20 1631  BNP 905.3*    ProBNP (last 3 results) No results for input(s): PROBNP in the last 8760 hours.    Other results:  Imaging: NM Pulmonary Perfusion  Result Date: 06/19/2020 CLINICAL DATA:  Inpatient.  Chronic worsening dyspnea.  Chest pain. EXAM: NUCLEAR MEDICINE PERFUSION LUNG SCAN TECHNIQUE: Perfusion images were obtained in multiple projections after intravenous injection of radiopharmaceutical. Ventilation scans intentionally deferred if perfusion scan and chest x-ray adequate for interpretation during COVID 19 epidemic. RADIOPHARMACEUTICALS:  4.0 mCi Tc-254m MAA IV COMPARISON:  06/16/2020 chest CT angiogram. 06/19/2020 chest radiograph. FINDINGS: No significant wedge-shaped perfusion defects in either lung. Medial left mid lung defect on the posterior view correlates with the enlarged main pulmonary artery contour as seen on recent chest CT. IMPRESSION: No evidence of pulmonary embolism on perfusion only scan. Electronically Signed   By: Delbert PhenixJason A Poff  M.D.   On: 06/19/2020 11:47   DG CHEST PORT 1 VIEW  Result Date: 06/19/2020 CLINICAL DATA:  Dyspnea EXAM: PORTABLE CHEST 1 VIEW COMPARISON:  06/11/2020 chest radiograph. FINDINGS: Stable cardiomediastinal silhouette with top-normal heart size and prominent main pulmonary artery contour. No pneumothorax. No pleural effusion. Lungs appear clear, with no acute consolidative airspace disease and no pulmonary edema. IMPRESSION: Clear lungs. Stable prominent main pulmonary artery contour,  correlating with dilated main pulmonary artery is seen on recent chest CT angiogram study. Electronically Signed   By: Delbert Phenix M.D.   On: 06/19/2020 11:48   ECHOCARDIOGRAM COMPLETE  Result Date: 06/18/2020    ECHOCARDIOGRAM REPORT   Patient Name:   Zoe Roach Date of Exam: 06/18/2020 Medical Rec #:  [id number]         Height:       65.0 in Accession #:    [id number]        Weight:       190.0 lb Date of Birth:  1991/07/03         BSA:          1.935 m Patient Age:    29 years          BP:           142/110 mmHg Patient Gender: F                 HR:           95 bpm. Exam Location:  [address] Procedure: 2D Echo, Cardiac Doppler and Color Doppler Indications:    R06.02  History:        Patient has no prior history of Echocardiogram examinations.                 Abnormal ECG, LE edema, Signs/Symptoms:Shortness of Breath; Risk                 Factors:Hypertension.  Sonographer:    Samule Ohm RDCS Referring Phys: [phone] TRACI R TURNER IMPRESSIONS  1. The right ventricular size is severely enlarged. Right ventricular systolic function is moderately reduced. There is severely elevated pulmonary artery systolic pressure with severely enlarged PA. The estimated right ventricular systolic pressure is 108.7 mmHg.  2. Left ventricular ejection fraction, by estimation, is 50 to 55%. The left ventricle has low normal function. The left ventricle has no regional wall motion abnormalities. There is mild concentric left ventricular hypertrophy. Left ventricular diastolic parameters are consistent with Grade II diastolic dysfunction (pseudonormalization). There is the interventricular septum is flattened in systole and diastole, consistent with right ventricular pressure and volume overload.  3. It appears that the RV pressure and volume overload is causing significant septal shift with resultant underfilling of the LV.  4. Right atrial size was severely dilated with continuous bowing of the interatrial septum  towards the LA consistent with very elevated RAP in the setting of moderate-to-severe TR.  5. The mitral valve is normal in structure. Trivial mitral regurgitation.  6. The aortic valve is tricuspid. Aortic valve regurgitation is not visualized.  7. PA is severely dilated measuring 4.0cm.  8. The inferior vena cava is dilated in size with <50% respiratory variability, suggesting right atrial pressure of 15 mmHg  9. Findings discussed with primary Cardiologist who had already referred the patient to the ED for further management Comparison(s): No prior Echocardiogram. FINDINGS  Left Ventricle: Left ventricular ejection fraction, by estimation, is 50 to 55%. The left ventricle has low normal  function. The left ventricle has no regional wall motion abnormalities. The left ventricular internal cavity size was normal in size. There is mild concentric left ventricular hypertrophy. The interventricular septum is flattened in systole and diastole, consistent with right ventricular pressure and volume overload. It appears that the RV pressure and volume overload is causing significant septal shift with resultant underfilling of the LV. Left ventricular diastolic parameters are consistent with Grade II diastolic dysfunction (pseudonormalization). Right Ventricle: The right ventricular size is severely enlarged. Right ventricular systolic function is moderately reduced. There is severely elevated pulmonary artery systolic pressure. The tricuspid regurgitant velocity is 4.84 m/s, and with an assumed right atrial pressure of 15 mmHg, the estimated right ventricular systolic pressure is 108.7 mmHg. Left Atrium: Left atrial size was normal in size. Right Atrium: Right atrial size was severely dilated. Pericardium: There is no evidence of pericardial effusion. Mitral Valve: The mitral valve is normal in structure. Trivial mitral valve regurgitation. Tricuspid Valve: The tricuspid valve is normal in structure. Tricuspid valve  regurgitation is moderate to severe. Aortic Valve: The aortic valve is tricuspid. Aortic valve regurgitation is not visualized. Pulmonic Valve: The pulmonic valve was normal in structure. Pulmonic valve regurgitation is mild. Aorta: The aortic root and ascending aorta are structurally normal, with no evidence of dilitation. Pulmonary Artery: PA is severely dilated measuring 4.0cm. Venous: The inferior vena cava is dilated in size with less than 50% respiratory variability, suggesting right atrial pressure of 15 mmHg. IAS/Shunts: There is left bowing of the interatrial septum, suggestive of elevated right atrial pressure. No atrial level shunt detected by color flow Doppler. Additional Comments: There is a small pleural effusion in the right lateral region.  LEFT VENTRICLE PLAX 2D LVIDd:         4.10 cm  Diastology LVIDs:         3.00 cm  LV e' medial:    6.20 cm/s LV PW:         1.35 cm  LV E/e' medial:  10.2 LV IVS:        1.40 cm  LV e' lateral:   5.55 cm/s LVOT diam:     2.10 cm  LV E/e' lateral: 11.4 LV SV:         34 LV SV Index:   18 LVOT Area:     3.46 cm  RIGHT VENTRICLE             IVC RV S prime:     6.74 cm/s   IVC diam: 2.50 cm TAPSE (M-mode): 0.9 cm RVSP:           108.7 mmHg LEFT ATRIUM             Index       RIGHT ATRIUM            Index LA diam:        3.70 cm 1.91 cm/m  RA Pressure: 15.00 mmHg LA Vol (A2C):   27.9 ml 14.42 ml/m RA Area:     29.70 cm LA Vol (A4C):   22.0 ml 11.37 ml/m RA Volume:   118.00 ml  60.97 ml/m LA Biplane Vol: 25.0 ml 12.92 ml/m  AORTIC VALVE LVOT Vmax:   72.50 cm/s LVOT Vmean:  45.800 cm/s LVOT VTI:    0.099 m  AORTA Ao Root diam: 3.60 cm Ao Asc diam:  3.40 cm MV E velocity: 63.40 cm/s  TRICUSPID VALVE MV A velocity: 45.00 cm/s  TR Peak grad:   93.7  mmHg MV E/A ratio:  1.41        TR Vmax:        484.00 cm/s                            Estimated RAP:  15.00 mmHg                            RVSP:           108.7 mmHg                             SHUNTS                             Systemic VTI:  0.10 m                            Systemic Diam: 2.10 cm Laurance Flatten MD Electronically signed by Laurance Flatten MD Signature Date/Time: 06/18/2020/3:43:11 PM    Final    VAS US RENAL ARTERY DUPLEX  Result Date: 06/19/2020 ABDOMINAL VISCERAL Indications: Hypertension Comparison Study: No prior study Performing Technologist: Gertie Fey MHA, RDMS, RVT, RDCS  Examination Guidelines: A complete evaluation includes B-mode imaging, spectral Doppler, color Doppler, and power Doppler as needed of all accessible portions of each vessel. Bilateral testing is considered an integral part of a complete examination. Limited examinations for reoccurring indications may be performed as noted.  Duplex Findings: +--------------------+--------+--------+------+--------+  Mesenteric           PSV cm/s EDV cm/s Plaque Comments  +--------------------+--------+--------+------+--------+  Aorta Prox              59       10                     +--------------------+--------+--------+------+--------+  Celiac Artery Origin    68                              +--------------------+--------+--------+------+--------+  SMA Proximal            45       11                     +--------------------+--------+--------+------+--------+    +------------------+--------+--------+-------+  Right Renal Artery PSV cm/s EDV cm/s Comment  +------------------+--------+--------+-------+  Origin                59       18             +------------------+--------+--------+-------+  Proximal              45       23             +------------------+--------+--------+-------+  Mid                   56       23             +------------------+--------+--------+-------+  Distal                45       20             +------------------+--------+--------+-------+ +-----------------+--------+--------+-------+  Left Renal Artery PSV cm/s EDV cm/s Comment  +-----------------+--------+--------+-------+  Origin              133        44             +-----------------+--------+--------+-------+  Proximal             31       13             +-----------------+--------+--------+-------+  Mid                  34       15             +-----------------+--------+--------+-------+  Distal               66       32             +-----------------+--------+--------+-------+ +------------+--------+--------+----+-----------+--------+--------+----+  Right Kidney PSV cm/s EDV cm/s RI   Left Kidney PSV cm/s EDV cm/s RI    +------------+--------+--------+----+-----------+--------+--------+----+  Upper Pole   11       4        0.63 Upper Pole  9        3        0.64  +------------+--------+--------+----+-----------+--------+--------+----+  Mid          21       8        0.64 Mid         19       10       0.49  +------------+--------+--------+----+-----------+--------+--------+----+  Lower Pole   14       5        0.63 Lower Pole  13       5        0.62  +------------+--------+--------+----+-----------+--------+--------+----+  Hilar        39       15       0.61 Hilar       23       10       0.57  +------------+--------+--------+----+-----------+--------+--------+----+ +------------------+--------+------------------+--------+  Right Kidney                Left Kidney                  +------------------+--------+------------------+--------+  RAR                         RAR                          +------------------+--------+------------------+--------+  RAR (manual)       1.0      RAR (manual)       2.25      +------------------+--------+------------------+--------+  Cortex             9/4 cm/s Cortex             9/4 cm/s  +------------------+--------+------------------+--------+  Cortex thickness            Corex thickness              +------------------+--------+------------------+--------+  Kidney length (cm) 11.30    Kidney length (cm) 11.40     +------------------+--------+------------------+--------+  Summary: Renal:  Right: No evidence of right renal  artery stenosis. RRV flow present. Left:  1-59% stenosis of the left renal artery. LRV flow present.  *See table(s)  above for measurements and observations.  Diagnosing physician: Lemar Livings MD  Electronically signed by Lemar Livings MD on 06/19/2020 at 3:30:38 PM.    Final      Medications:     Scheduled Medications:  furosemide  60 mg Intravenous BID   gabapentin  200 mg Oral QHS   heparin  5,000 Units Subcutaneous Q8H   hydrALAZINE  50 mg Oral Q8H   HYDROcodone-acetaminophen  1 tablet Oral Q6H   levothyroxine  62.5 mcg Oral Q0600   pantoprazole  20 mg Oral Daily   sodium chloride flush  3 mL Intravenous Q12H   sodium chloride flush  3 mL Intravenous Q12H    Infusions:  sodium chloride      PRN Medications: sodium chloride, acetaminophen, hydrocortisone cream, sodium chloride flush   Assessment/Plan:    1. Pulmonary hypertension/RV failure: Echo with normal LV size with mild LV hypertrophy, EF 50-55%,  D-shaped septum due to RV pressure/volume overload, severely dilated RV with moderately decreased RV systolic function,  PASP 109 mmHg, dilated IVC. She has been symptomatic for months, suspect due to gradually worsening RV failure.  Recent CTA chest did not show significant lung pathology and did not show PE.  No diet/weight loss drugs.  I am concerned for pulmonary arterial hypertension causing RV failure, primary pulmonary hypertension is very possible though need to rule out rheumatological diagnoses.  She has no joint pains or abnormal rashes. BNP 905. HsTn 9. She is diuresing well.  - Volume status improving. Continue  Lasix 60 mg IV bid for diuresis and follow closely.  - HIV, viral panel and ANA negative.  - Anti-SCL70, anti-centromere Ab, ANA, RF are pending.  Will add hepatitis panels in setting of IVDA history.  - V/Q negative. - Eventual sleep study and PFTs.  - Plan RHC tomorrow.  - If she is found to have pulmonary arterial hypertension, she will need to  begin aggressive treatment and may even end up needing IV therapy.   2. Systemic HTN: Patient has had systemic HTN for some time.  She has a strong family history of this. She thinks losartan and diltiazem caused facial swelling so has stopped them.  She thinks metoprolol caused a rash.  Concern for secondary HTN given young age (and family history). Systolics much improved but diastolic BP remains quite high.  - Plasma renin (normal) and aldosterone (pending) levels sent.  - Serum metanephrines sent. 24 hour urine for catecholamines in process.  - Renal artery dopplers 06/19/20 to look for fibromuscular dysplasia. LRAS 1-59%  O/w normal - Cleda Daub stopped due to hyperkalemia - Continue hydralazine 50 mg tid.   - Add low dose carvedilol after cath if output ok.  - Would ideally try her on amlodipine 5 mg daily eventually (not sure she had a true reaction to diltiazem, though I am concerned about possible allergic reaction to losartan).   3. Hypothyroidism: Continue Levoxyl.   4. 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  - appreciate TRH's help -  She is very defensive about any wean in her pain meds and I am concerned about ongoing drug-seeking behavior. Boyfriend in room this with her am and dynamic gave me some concern.  - I am going to repeat UDS although will clearly be positive for narcotics   Length of Stay: 2   Arvilla Meres MD 06/20/2020, 11:19 AM  Advanced Heart Failure Team Pager [phone] (M-F; 7a - 4p)  Please contact [facility] for night-coverage after hours (  4p -7a ) and weekends on [email or web address]

## 2020-06-21 ENCOUNTER — Encounter (HOSPITAL_COMMUNITY)
Admission: EM | Disposition: A | Discharge status: Home / Self Care | Payer: Self-pay | Source: Home / Self Care | Attending: Cardiology

## 2020-06-21 ENCOUNTER — Encounter (HOSPITAL_COMMUNITY): Payer: Self-pay | Admitting: Cardiology

## 2020-06-21 DIAGNOSIS — I272 Pulmonary hypertension, unspecified: Secondary | ICD-10-CM | POA: Diagnosis not present

## 2020-06-21 HISTORY — PX: RIGHT HEART CATH: CATH118263

## 2020-06-21 LAB — POCT I-STAT EG7
Acid-Base Excess: 7 mmol/L — ABNORMAL HIGH (ref 0.0–2.0)
Acid-Base Excess: 7 mmol/L — ABNORMAL HIGH (ref 0.0–2.0)
Bicarbonate: 32.5 mmol/L — ABNORMAL HIGH (ref 20.0–28.0)
Bicarbonate: 32 mmol/L — ABNORMAL HIGH (ref 20.0–28.0)
Calcium, Ion: 1.17 mmol/L (ref 1.15–1.40)
Calcium, Ion: 1.16 mmol/L (ref 1.15–1.40)
HCT: 51 % — ABNORMAL HIGH (ref 36.0–46.0)
HCT: 50 % — ABNORMAL HIGH (ref 36.0–46.0)
Hemoglobin: 17.3 g/dL — ABNORMAL HIGH (ref 12.0–15.0)
Hemoglobin: 17 g/dL — ABNORMAL HIGH (ref 12.0–15.0)
O2 Saturation: 54 %
O2 Saturation: 52 %
Potassium: 4.2 mmol/L (ref 3.5–5.1)
Potassium: 4.2 mmol/L (ref 3.5–5.1)
Sodium: 138 mmol/L (ref 135–145)
Sodium: 138 mmol/L (ref 135–145)
TCO2: 34 mmol/L — ABNORMAL HIGH (ref 22–32)
TCO2: 33 mmol/L — ABNORMAL HIGH (ref 22–32)
pCO2, Ven: 45.6 mmHg (ref 44.0–60.0)
pCO2, Ven: 46.2 mmHg (ref 44.0–60.0)
pH, Ven: 7.461 — ABNORMAL HIGH (ref 7.250–7.430)
pH, Ven: 7.448 — ABNORMAL HIGH (ref 7.250–7.430)
pO2, Ven: 27 mmHg — CL (ref 32.0–45.0)
pO2, Ven: 27 mmHg — CL (ref 32.0–45.0)

## 2020-06-21 LAB — MISC LABCORP TEST (SEND OUT): Labcorp test code: 2007996

## 2020-06-21 LAB — TSH: TSH: 22.264 u[IU]/mL — ABNORMAL HIGH (ref 0.350–4.500)

## 2020-06-21 LAB — BASIC METABOLIC PANEL
Anion gap: 13 (ref 5–15)
BUN: 22 mg/dL — ABNORMAL HIGH (ref 6–20)
CO2: 22 mmol/L (ref 22–32)
Calcium: 8.7 mg/dL — ABNORMAL LOW (ref 8.9–10.3)
Chloride: 100 mmol/L (ref 98–111)
Creatinine, Ser: 1.35 mg/dL — ABNORMAL HIGH (ref 0.44–1.00)
GFR, Estimated: 54 mL/min — ABNORMAL LOW (ref >60)
Glucose, Bld: 95 mg/dL (ref 70–99)
Potassium: 4.3 mmol/L (ref 3.5–5.1)
Sodium: 135 mmol/L (ref 135–145)

## 2020-06-21 LAB — HEPATITIS PANEL, ACUTE
HCV Ab: REACTIVE — AB
Hep A IgM: NONREACTIVE
Hep B C IgM: NONREACTIVE
Hepatitis B Surface Ag: NONREACTIVE

## 2020-06-21 SURGERY — RIGHT HEART CATH
Anesthesia: LOCAL

## 2020-06-21 MED ORDER — LIDOCAINE HCL (PF) 1 % IJ SOLN
INTRAMUSCULAR | Status: DC | PRN
  Administered 2020-06-21: 2 mL

## 2020-06-21 MED ORDER — HEPARIN (PORCINE) IN NACL 1000-0.9 UT/500ML-% IV SOLN
INTRAVENOUS | Status: DC | PRN
  Administered 2020-06-21: 500 mL

## 2020-06-21 MED ORDER — MIDAZOLAM HCL 2 MG/2ML IJ SOLN
INTRAMUSCULAR | Status: DC | PRN
  Administered 2020-06-21: 0.5 mg via INTRAVENOUS

## 2020-06-21 MED ORDER — HEPARIN (PORCINE) IN NACL 1000-0.9 UT/500ML-% IV SOLN
INTRAVENOUS | Status: AC
  Filled 2020-06-21: qty 1000

## 2020-06-21 MED ORDER — FENTANYL CITRATE (PF) 100 MCG/2ML IJ SOLN
INTRAMUSCULAR | Status: AC
  Filled 2020-06-21: qty 2

## 2020-06-21 MED ORDER — LIDOCAINE HCL (PF) 1 % IJ SOLN
INTRAMUSCULAR | Status: AC
  Filled 2020-06-21: qty 30

## 2020-06-21 MED ORDER — SPIRONOLACTONE 12.5 MG HALF TABLET
12.5000 mg | ORAL_TABLET | Freq: Every day | ORAL | Status: DC
  Administered 2020-06-21 – 2020-06-22 (×2): 12.5 mg via ORAL
  Filled 2020-06-21 (×2): qty 1

## 2020-06-21 MED ORDER — TADALAFIL 20 MG PO TABS
20.0000 mg | ORAL_TABLET | Freq: Every day | ORAL | Status: DC
  Administered 2020-06-21 – 2020-06-22 (×2): 20 mg via ORAL
  Filled 2020-06-21 (×3): qty 1

## 2020-06-21 MED ORDER — FUROSEMIDE 40 MG PO TABS
60.0000 mg | ORAL_TABLET | Freq: Every day | ORAL | Status: DC
  Administered 2020-06-21 – 2020-06-23 (×3): 60 mg via ORAL
  Filled 2020-06-21 (×3): qty 1

## 2020-06-21 MED ORDER — DIGOXIN 0.0625 MG HALF TABLET
0.0625 mg | ORAL_TABLET | Freq: Every day | ORAL | Status: DC
  Administered 2020-06-21 – 2020-06-23 (×3): 0.0625 mg via ORAL
  Filled 2020-06-21 (×3): qty 1

## 2020-06-21 MED ORDER — FENTANYL CITRATE (PF) 100 MCG/2ML IJ SOLN
INTRAMUSCULAR | Status: DC | PRN
  Administered 2020-06-21: 25 ug via INTRAVENOUS

## 2020-06-21 MED ORDER — MIDAZOLAM HCL 2 MG/2ML IJ SOLN
INTRAMUSCULAR | Status: AC
  Filled 2020-06-21: qty 2

## 2020-06-21 SURGICAL SUPPLY — 9 items
CATH SWAN DBL LUMAN 5F 110 (CATHETERS) ×1 IMPLANT
CATH SWAN GANZ 7F STRAIGHT (CATHETERS) ×1 IMPLANT
GLIDESHEATH SLENDER 7FR .021G (SHEATH) ×1 IMPLANT
GUIDEWIRE .025 260CM (WIRE) ×1 IMPLANT
KIT HEART LEFT (KITS) ×2 IMPLANT
PACK CARDIAC CATHETERIZATION (CUSTOM PROCEDURE TRAY) ×2 IMPLANT
PROTECTION STATION PRESSURIZED (MISCELLANEOUS) ×2
STATION PROTECTION PRESSURIZED (MISCELLANEOUS) IMPLANT
TRANSDUCER W/STOPCOCK (MISCELLANEOUS) ×2 IMPLANT

## 2020-06-21 NOTE — Consult Note (Signed)
.                Medical Consultation   Zoe Roach  UJW:[id number]  DOB: 08-09-1991  DOA: 06/18/2020  PCP: Leilani Able, MD   Outpatient Specialists:  None   Requesting physician: Bensimhon - cardiology  Reason for consultation: Chronic pain.   History of Present Illness: Zoe Roach is an 29 y.o. female with h/o IVDA; hypothyroidism; and recently diagnosed HTN who presented to cardiology due to RVH/R heart strain on EKG.  Echo was performed on showed severely enlarged RV with moderately reduced systolic function and marked pulmonary HTN.  She reports that she went to the dentist for dental extraction.  BP was 211/160 without h/o prior personal HTN.  She went to the ER Friday and was referred to cardiology Monday and they sent her here for right-sided heart failure with pulmonary HTN - hereditary vs. Idiopathic.  She has had progressive DOE and is not able to do as much activity.  She did have COVID a year ago.  The dentist gave her Norco 10 mg - taking 1/2 tab q5h prn due to dental pain.  She reports that this is "minimum" to keep the pain under control.  They have been giving it every 8 hours and this has made her unable to get the pain controlled.  She cannot get the dental procedure done until after the heart issue is settled.  Prior to this issue she was taking Ibuprofen.  She saw an endodontist yesterday and he does not think the tooth needs to come out, needs further testing; he suggested that this may be related to nerve pain which will improve spontaneously over time.  She has been taking the Norco for maybe 1-2 weeks.  She is very forthcoming about her h/o heroin dependence.  She quit about 3 1/2 years ago, after attending a year-long program; she is also involved in NA and has a sponsor.  She did abuse Neurontin periodically in the past, remotely.   She has been in touch with her sponsor regarding her need for opiate pain medication at this time and is concerned about  this, but also cannot bear the pain.   Review of Systems:  Review of Systems  HENT: Negative.        TEETH PAIN.  Eyes: Negative.   Respiratory: Negative.   Cardiovascular: Negative.   Skin: Negative.   All other systems reviewed and are negative.  Past Medical History: Past Medical History:  Diagnosis Date  . History of COVID-19   . Hypothyroidism (acquired)   . IVDU (intravenous drug user)   . SOB (shortness of breath)     Past Surgical History: Past Surgical History:  Procedure Laterality Date  . CESAREAN SECTION    . RIGHT HEART CATH N/A 06/21/2020   Procedure: RIGHT HEART CATH;  Surgeon: Laurey Morale, MD;  Location: [facility] INVASIVE CV LAB;  Service: Cardiovascular;  Laterality: N/A;     Allergies:  Allergies  Allergen Reactions  . Almond (Diagnostic) Hives  . Cherry Hives  . Diltiazem Other (See Comments)    Swollen face  . Losartan Swelling  . Peach [Prunus Persica] Hives  . Metoprolol Rash     Social History:  reports that she has been smoking. She has a 15.00 pack-year smoking history. She has never used smokeless tobacco. She reports current alcohol use. She reports current drug use.   Family History: Family History  Problem Relation Age of Onset  .  Hypertension Paternal Grandfather   . Hypertension Mother     Physical Exam: Vitals:   06/21/20 0822 06/21/20 0827 06/21/20 0832 06/21/20 1201  BP: 118/88 128/86 115/83 104/65  Pulse: 93 85 86 96  Resp: 15 14 14 19   Temp:    98.3 F (36.8 C)  TempSrc:    Oral  SpO2: (!) 87% 100% 98% 96%  Weight:      Height:        Physical Exam Vitals and nursing note reviewed.  HENT:     Head: Normocephalic and atraumatic.  Eyes:     Extraocular Movements: Extraocular movements intact.  Cardiovascular:     Rate and Rhythm: Normal rate and regular rhythm.     Heart sounds: Normal heart sounds.  Pulmonary:     Effort: Pulmonary effort is normal.     Breath sounds: Normal breath sounds.  Abdominal:      Palpations: Abdomen is soft.  Neurological:     Mental Status: She is alert and oriented to person, place, and time.    Data reviewed:  I have personally reviewed following labs and imaging studies Labs:  CBC: Recent Labs  Lab 06/18/20 1455 06/18/20 1631  WBC 8.5 8.6  HGB 15.0 15.1*  HCT 47.8* 46.8*  MCV 92.1 91.1  PLT 141* 143*    Basic Metabolic Panel: Recent Labs  Lab 06/18/20 1455 06/18/20 1455 06/18/20 1631 06/19/20 0406 06/19/20 0406 06/20/20 0252 06/21/20 0452  NA 139  --   --  137  --  137 135  K 4.8   < >  --  5.5*   < > 4.1 4.3  CL 106  --   --  104  --  100 100  CO2 25  --   --  24  --  28 22  GLUCOSE 102*  --   --  91  --  97 95  BUN 19  --   --  20  --  18 22*  CREATININE 1.09*  --  1.06* 1.09*  --  1.20* 1.35*  CALCIUM 8.6*  --   --  8.5*  --  8.7* 8.7*   < > = values in this interval not displayed.   GFR Estimated Creatinine Clearance: 63.2 mL/min (A) (by C-G formula based on SCr of 1.35 mg/dL (H)). Liver Function Tests: No results for input(s): AST, ALT, ALKPHOS, BILITOT, PROT, ALBUMIN in the last 168 hours. No results for input(s): LIPASE, AMYLASE in the last 168 hours. No results for input(s): AMMONIA in the last 168 hours. Coagulation profile No results for input(s): INR, PROTIME in the last 168 hours.  Cardiac Enzymes: No results for input(s): CKTOTAL, CKMB, CKMBINDEX, TROPONINI in the last 168 hours. BNP: Invalid input(s): POCBNP CBG: No results for input(s): GLUCAP in the last 168 hours. D-Dimer No results for input(s): DDIMER in the last 72 hours. Hgb A1c No results for input(s): HGBA1C in the last 72 hours. Lipid Profile No results for input(s): CHOL, HDL, LDLCALC, TRIG, CHOLHDL, LDLDIRECT in the last 72 hours. Thyroid function studies Recent Labs    06/21/20 1130  TSH 22.264*   Anemia work up No results for input(s): VITAMINB12, FOLATE, FERRITIN, TIBC, IRON, RETICCTPCT in the last 72 hours. Urinalysis No results found  for: COLORURINE, APPEARANCEUR, LABSPEC, PHURINE, GLUCOSEU, HGBUR, BILIRUBINUR, KETONESUR, PROTEINUR, UROBILINOGEN, NITRITE, LEUKOCYTESUR   Sepsis Labs Invalid input(s): PROCALCITONIN,  WBC,  LACTICIDVEN Microbiology Recent Results (from the past 240 hour(s))  Respiratory Panel by RT PCR (Flu  A&B, Covid) - Nasopharyngeal Swab     Status: None   Collection Time: 06/19/20 12:02 AM   Specimen: Nasopharyngeal Swab  Result Value Ref Range Status   SARS Coronavirus 2 by RT PCR NEGATIVE NEGATIVE Final    Comment: (NOTE) SARS-CoV-2 target nucleic acids are NOT DETECTED.  The SARS-CoV-2 RNA is generally detectable in upper respiratoy specimens during the acute phase of infection. The lowest concentration of SARS-CoV-2 viral copies this assay can detect is 131 copies/mL. A negative result does not preclude SARS-Cov-2 infection and should not be used as the sole basis for treatment or other patient management decisions. A negative result may occur with  improper specimen collection/handling, submission of specimen other than nasopharyngeal swab, presence of viral mutation(s) within the areas targeted by this assay, and inadequate number of viral copies (<131 copies/mL). A negative result must be combined with clinical observations, patient history, and epidemiological information. The expected result is Negative.  Fact Sheet for Patients:  https://www.moore.com/https://www.fda.gov/media/142436/download  Fact Sheet for Healthcare Providers:  https://www.young.biz/https://www.fda.gov/media/142435/download  This test is no t yet approved or cleared by the [place] FDA and  has been authorized for detection and/or diagnosis of SARS-CoV-2 by FDA under an Emergency Use Authorization (EUA). This EUA will remain  in effect (meaning this test can be used) for the duration of the COVID-19 declaration under Section 564(b)(1) of the Act, 21 U.S.C. section 360bbb-3(b)(1), unless the authorization is terminated or revoked sooner.      Influenza A by PCR NEGATIVE NEGATIVE Final   Influenza B by PCR NEGATIVE NEGATIVE Final    Comment: (NOTE) The Xpert Xpress SARS-CoV-2/FLU/RSV assay is intended as an aid in  the diagnosis of influenza from Nasopharyngeal swab specimens and  should not be used as a sole basis for treatment. Nasal washings and  aspirates are unacceptable for Xpert Xpress SARS-CoV-2/FLU/RSV  testing.  Fact Sheet for Patients: https://www.moore.com/https://www.fda.gov/media/142436/download  Fact Sheet for Healthcare Providers: https://www.young.biz/https://www.fda.gov/media/142435/download  This test is not yet approved or cleared by the [place] FDA and  has been authorized for detection and/or diagnosis of SARS-CoV-2 by  FDA under an Emergency Use Authorization (EUA). This EUA will remain  in effect (meaning this test can be used) for the duration of the  Covid-19 declaration under Section 564(b)(1) of the Act, 21  U.S.C. section 360bbb-3(b)(1), unless the authorization is  terminated or revoked. Performed at [facility], [address] [place], [place] [address]     Inpatient Medications:   Scheduled Meds: . digoxin  0.0625 mg Oral Daily  . furosemide  60 mg Oral Daily  . gabapentin  200 mg Oral QHS  . heparin  5,000 Units Subcutaneous Q8H  . hydrALAZINE  50 mg Oral Q8H  . HYDROcodone-acetaminophen  1 tablet Oral Q6H  . levothyroxine  62.5 mcg Oral Q0600  . pantoprazole  20 mg Oral Daily  . sodium chloride flush  3 mL Intravenous Q12H  . sodium chloride flush  3 mL Intravenous Q12H  . spironolactone  12.5 mg Oral Daily  . tadalafil  20 mg Oral Daily   Continuous Infusions: . sodium chloride      Radiological Exams on Admission: CARDIAC CATHETERIZATION  Result Date: 06/21/2020 Severe pulmonary hypertension with low cardiac output.  Filling pressures normal.    Impression/Recommendations Pulmonary hypertension, unspecified (HCC) -pt to have rt heart cath no h/o diet pills , maybe mild sleep apnea  Pain,  dental -cont current pain medicine.Consider only prn tramadol, gabapentin is helping her  pain currently.   History of narcotic addiction (HCC) -d/w pt about long term complication to be aware about due to drugs abuse, exp nephrology and pulmonary. No change.  Tobacco dependence D/w pt about tobacco cessation and its effects on pulmonary, cardiac and nephrology.Marland Kitchen  AKI (acute kidney injury) (HCC) Pt  Lab Results  Component Value Date   CREATININE 1.35 (H) 06/21/2020   CREATININE 1.20 (H) 06/20/2020   CREATININE 1.09 (H) 06/19/2020  Avoid nephrotoxic agent or contrast, Lasix po now  And will follow.   HTN (hypertension) Pt is on lasix and hydralazine. Blood pressure 104/65, pulse 96, temperature 98.3 F (36.8 C), temperature source Oral, resp. rate 19, height 5\' 5"  (1.651 m), weight 80.3 kg, SpO2 96 %. As we see BP is well controlled. We will continue this regimen and eventually ACEI/ARB When kidney function is back to baseline. Blood pressure 104/65, pulse 96, temperature 98.3 F (36.8 C), temperature source Oral, resp. rate 19, height 5\' 5"  (1.651 m), weight 80.3 kg, SpO2 96 %. Bp is excellent. No change.   Thank you for this consultation.  Our [facility] hospitalist team will follow the patient with you.   Time Spent:  M.D. [phone] [facility] 06/21/2020, 3:54 PM

## 2020-06-21 NOTE — Progress Notes (Signed)
Patient alert and oriented x 4, s/p right heart cath this am. Lung sounds clear, resps even and unlabored. Abdomen soft, nontender, nondistended; pt had BM yesterday. Continues to have generalized edema; fingers look difficult to bend/make a fist, BLE with 2+ edema. New meds for pulmonary HTN started today; initial education provided to patient and spouse, allowed time for questions- both are eager to learn how to better manage new diagnoses once discharge home. Patient continues to have good output with switch to PO diuretics, but has had to be reminded to day that we still need to measure output; she has been urinating in the toilet without the hat. This afternoon patient became quite overwhelmed and tearful regarding the new diagnoses and all the information that has been given to her over the last few days. She was allowed to be escorted by nurse tech to [facility] for some fresh air and change of scenery; she stated this helped tremendously. Since the initial dose of her tildalenfil, her Bps seem to have decreased; 2pm dose of hydralazine was held for BP of 106/65. She has also not required any supplemental oxygen today. No complaints of headache or pain; norco TID provides sufficient relief for tooth pain. Patient is very appreciative of all nursing and physician staff and reports being happy with care. Call light in reach, will continue to monitor.

## 2020-06-21 NOTE — Progress Notes (Signed)
Patient ID: Zoe Roach, female   DOB: 08/18/1990, 29 y.o.   MRN: [id number]    Advanced Heart Failure Rounding Note   Subjective:    Good diuresis yesterday, weight down.   ANA and viral panel negative. Other serologies still pending.   RHC Procedural Findings: Hemodynamics (mmHg) RA mean 6 RV 99/5 PA 104/56, mean 74 PCWP mean 12 Oxygen saturations: SVC 52% PA 54% AO 98% Cardiac Output (Fick) 2.64  Cardiac Index (Fick) 1.41 PVR 23 WU  Objective:   Weight Range:  Vital Signs:   Temp:  [97.5 F (36.4 C)-98.4 F (36.9 C)] 97.6 F (36.4 C) (11/08 0607) Pulse Rate:  [85-172] 86 (11/08 0832) Resp:  [7-20] 14 (11/08 0832) BP: (104-142)/(83-112) 115/83 (11/08 0832) SpO2:  [87 %-100 %] 98 % (11/08 0832) Weight:  [80.3 kg] 80.3 kg (11/08 0025) Last BM Date: 06/17/20  Weight change: Filed Weights   06/19/20 0003 06/20/20 0354 06/21/20 0025  Weight: 85.6 kg 83.3 kg 80.3 kg    Intake/Output:   Intake/Output Summary (Last 24 hours) at 06/21/2020 0841 Last data filed at 06/21/2020 1324 Gross per 24 hour  Intake 603 ml  Output 4550 ml  Net -3947 ml     Physical Exam: General: NAD Neck: No JVD, no thyromegaly or thyroid nodule.  Lungs: Clear to auscultation bilaterally with normal respiratory effort. CV: Nondisplaced PMI.  Heart regular S1/S2 with loud P2, no S3/S4, no murmur.  No peripheral edema.   Abdomen: Soft, nontender, no hepatosplenomegaly, no distention.  Skin: Intact without lesions or rashes.  Neurologic: Alert and oriented x 3.  Psych: Normal affect. Extremities: No clubbing or cyanosis.  HEENT: Normal.   Telemetry: Sinus 90s Personally reviewed   Labs: Basic Metabolic Panel: Recent Labs  Lab 06/18/20 1455 06/18/20 1455 06/18/20 1631 06/19/20 0406 06/20/20 0252 06/21/20 0452  NA 139  --   --  137 137 135  K 4.8  --   --  5.5* 4.1 4.3  CL 106  --   --  104 100 100  CO2 25  --   --  24 28 22   GLUCOSE 102*  --   --  91 97 95  BUN 19   --   --  20 18 22*  CREATININE 1.09*  --  1.06* 1.09* 1.20* 1.35*  CALCIUM 8.6*   < >  --  8.5* 8.7* 8.7*   < > = values in this interval not displayed.    Liver Function Tests: No results for input(s): AST, ALT, ALKPHOS, BILITOT, PROT, ALBUMIN in the last 168 hours. No results for input(s): LIPASE, AMYLASE in the last 168 hours. No results for input(s): AMMONIA in the last 168 hours.  CBC: Recent Labs  Lab 06/18/20 1455 06/18/20 1631  WBC 8.5 8.6  HGB 15.0 15.1*  HCT 47.8* 46.8*  MCV 92.1 91.1  PLT 141* 143*    Cardiac Enzymes: No results for input(s): CKTOTAL, CKMB, CKMBINDEX, TROPONINI in the last 168 hours.  BNP: BNP (last 3 results) Recent Labs    06/18/20 1631  BNP 905.3*    ProBNP (last 3 results) No results for input(s): PROBNP in the last 8760 hours.    Other results:  Imaging: NM Pulmonary Perfusion  Result Date: 06/19/2020 CLINICAL DATA:  Inpatient.  Chronic worsening dyspnea.  Chest pain. EXAM: NUCLEAR MEDICINE PERFUSION LUNG SCAN TECHNIQUE: Perfusion images were obtained in multiple projections after intravenous injection of radiopharmaceutical. Ventilation scans intentionally deferred if perfusion scan and chest x-ray adequate  for interpretation during COVID 19 epidemic. RADIOPHARMACEUTICALS:  4.0 mCi Tc-5826m MAA IV COMPARISON:  06/16/2020 chest CT angiogram. 06/19/2020 chest radiograph. FINDINGS: No significant wedge-shaped perfusion defects in either lung. Medial left mid lung defect on the posterior view correlates with the enlarged main pulmonary artery contour as seen on recent chest CT. IMPRESSION: No evidence of pulmonary embolism on perfusion only scan. Electronically Signed   By: Delbert PhenixJason A Poff M.D.   On: 06/19/2020 11:47   CARDIAC CATHETERIZATION  Result Date: 06/21/2020 Severe pulmonary hypertension with low cardiac output.  Filling pressures normal.   DG CHEST PORT 1 VIEW  Result Date: 06/19/2020 CLINICAL DATA:  Dyspnea EXAM: PORTABLE CHEST 1  VIEW COMPARISON:  06/11/2020 chest radiograph. FINDINGS: Stable cardiomediastinal silhouette with top-normal heart size and prominent main pulmonary artery contour. No pneumothorax. No pleural effusion. Lungs appear clear, with no acute consolidative airspace disease and no pulmonary edema. IMPRESSION: Clear lungs. Stable prominent main pulmonary artery contour, correlating with dilated main pulmonary artery is seen on recent chest CT angiogram study. Electronically Signed   By: Delbert PhenixJason A Poff M.D.   On: 06/19/2020 11:48   VAS US RENAL ARTERY DUPLEX  Result Date: 06/19/2020 ABDOMINAL VISCERAL Indications: Hypertension Comparison Study: No prior study Performing Technologist: Gertie FeyMichelle Simonetti MHA, RDMS, RVT, RDCS  Examination Guidelines: A complete evaluation includes B-mode imaging, spectral Doppler, color Doppler, and power Doppler as needed of all accessible portions of each vessel. Bilateral testing is considered an integral part of a complete examination. Limited examinations for reoccurring indications may be performed as noted.  Duplex Findings: +--------------------+--------+--------+------+--------+ Mesenteric          PSV cm/sEDV cm/sPlaqueComments +--------------------+--------+--------+------+--------+ Aorta Prox             59      10                  +--------------------+--------+--------+------+--------+ Celiac Artery Origin   68                          +--------------------+--------+--------+------+--------+ SMA Proximal           45      11                  +--------------------+--------+--------+------+--------+    +------------------+--------+--------+-------+ Right Renal ArteryPSV cm/sEDV cm/sComment +------------------+--------+--------+-------+ Origin               59      18           +------------------+--------+--------+-------+ Proximal             45      23           +------------------+--------+--------+-------+ Mid                  56       23           +------------------+--------+--------+-------+ Distal               45      20           +------------------+--------+--------+-------+ +-----------------+--------+--------+-------+ Left Renal ArteryPSV cm/sEDV cm/sComment +-----------------+--------+--------+-------+ Origin             133      44           +-----------------+--------+--------+-------+ Proximal            31      13           +-----------------+--------+--------+-------+  Mid                 34      15           +-----------------+--------+--------+-------+ Distal              66      32           +-----------------+--------+--------+-------+ +------------+--------+--------+----+-----------+--------+--------+----+ Right KidneyPSV cm/sEDV cm/sRI  Left KidneyPSV cm/sEDV cm/sRI   +------------+--------+--------+----+-----------+--------+--------+----+ Upper Pole  11      4       0.63Upper Pole 9       3       0.64 +------------+--------+--------+----+-----------+--------+--------+----+ Mid         21      8       0.        19      10      0.49 +------------+--------+--------+----+-----------+--------+--------+----+ Lower Pole  14      5       0.63Lower Pole 13      5       0.62 +------------+--------+--------+----+-----------+--------+--------+----+ Hilar       39      15      0.61Hilar      23      10      0.57 +------------+--------+--------+----+-----------+--------+--------+----+ +------------------+--------+------------------+--------+ Right Kidney              Left Kidney                +------------------+--------+------------------+--------+ RAR                       RAR                        +------------------+--------+------------------+--------+ RAR (manual)      1.0     RAR (manual)      2.25     +------------------+--------+------------------+--------+ Cortex            9/4 cm/sCortex            9/4 cm/s  +------------------+--------+------------------+--------+ Cortex thickness          Corex thickness            +------------------+--------+------------------+--------+ Kidney length (cm)11.30   Kidney length (cm)11.40    +------------------+--------+------------------+--------+  Summary: Renal:  Right: No evidence of right renal artery stenosis. RRV flow present. Left:  1-59% stenosis of the left renal artery. LRV flow present.  *See table(s) above for measurements and observations.  Diagnosing physician: Lemar Livings MD  Electronically signed by Lemar Livings MD on 06/19/2020 at 3:30:38 PM.    Final      Medications:     Scheduled Medications: . digoxin  0.0625 mg Oral Daily  . furosemide  60 mg Oral Daily  . [MAR Hold] gabapentin  200 mg Oral QHS  . [MAR Hold] heparin  5,000 Units Subcutaneous Q8H  . [MAR Hold] hydrALAZINE  50 mg Oral Q8H  . [MAR Hold] HYDROcodone-acetaminophen  1 tablet Oral Q6H  . [MAR Hold] lactulose  15 g Oral Once  . [MAR Hold] levothyroxine  62.5 mcg Oral Q0600  . [MAR Hold] pantoprazole  20 mg Oral Daily  . [MAR Hold] sodium chloride flush  3 mL Intravenous Q12H  . [MAR Hold] sodium chloride flush  3 mL Intravenous Q12H  . spironolactone  12.5 mg Oral Daily  . tadalafil  20 mg Oral Daily  Infusions: . [MAR Hold] sodium chloride    . sodium chloride      PRN Medications: [MAR Hold] sodium chloride, sodium chloride, [MAR Hold] acetaminophen, [MAR Hold] hydrocortisone cream, [MAR Hold] sodium chloride flush, sodium chloride flush   Assessment/Plan:    1. Pulmonary hypertension/RV failure: Echo with normal LV size with mild LV hypertrophy, EF 50-55%,  D-shaped septum due to RV pressure/volume overload, severely dilated RV with moderately decreased RV systolic function, PASP 109 mmHg, dilated IVC. She has been symptomatic for months, suspect due to gradually worsening RV failure.  Recent CTA chest did not show significant lung pathology and did not  show PE and V/Q scan negative.  No diet/weight loss drugs.  I am concerned for pulmonary arterial hypertension causing RV failure, primary pulmonary hypertension is very possible though need to rule out rheumatological diagnoses.  ANA and HIV negative.  She has no joint pains or abnormal rashes. BNP 905. HsTn 9. She has diuresed well.  RHC today shows severe pulmonary arterial hypertension with low cardiac output and normal filling pressures.  - Stop IV Lasix, start Lasix 60 mg po daily.    - Anti-SCL70, anti-centromere Ab, RF are pending.  Added hepatitis panels in setting of IVDA history. - Eventual sleep study and PFTs.  - She will need aggressive treatment of PAH and given low output, may need IV therapy.  Will start tadalafil 20 mg daily today and Opsumit soon.  Add digoxin 0.0625 daily.  2. Systemic HTN: Patient has had systemic HTN for some time.  She has a strong family history of this. She thinks losartan and diltiazem caused facial swelling so has stopped them.  She thinks metoprolol caused a rash.  Concern for secondary HTN given young age (and family history). Plasma renin and aldosterone normal. BP improving.  - Plasma renin (normal) and aldosterone (pending) levels sent.  - Serum metanephrines sent. 24 hour urine for catecholamines in process.  - Renal artery dopplers 06/19/20 to look for fibromuscular dysplasia. LRAS 1-59%  O/w normal - ?accuracy of one elevated K reading, will rechallenge with spironolactone 12.5 daily.  - Continue hydralazine 50 mg tid.   - No beta blocker with low output.  - Would ideally try her on amlodipine 5 mg daily eventually (not sure she had a true reaction to diltiazem, though I am concerned about possible allergic reaction to losartan).  3. Hypothyroidism: Continue Levoxyl.  4. 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  - appreciate TRH's help -  She is very defensive about any wean in her pain meds and I am concerned about ongoing  drug-seeking behavior.   Length of Stay: 3   Marca Ancona MD 06/21/2020, 8:41 AM  Advanced Heart Failure Team Pager [phone] (M-F; 7a - 4p)  Please contact [facility] for night-coverage after hours (4p -7a ) and weekends on [email or web address]

## 2020-06-21 NOTE — Interval H&P Note (Signed)
History and Physical Interval Note:  06/21/2020 7:54 AM  Zoe Roach  has presented today for surgery, with the diagnosis of PULMONARY HYPERTENSION.  The various methods of treatment have been discussed with the patient and family. After consideration of risks, benefits and other options for treatment, the patient has consented to  Procedure(s): RIGHT HEART CATH (N/A) as a surgical intervention.  The patient's history has been reviewed, patient examined, no change in status, stable for surgery.  I have reviewed the patient's chart and labs.  Questions were answered to the patient's satisfaction.     Willetta York [employer]

## 2020-06-22 ENCOUNTER — Telehealth (HOSPITAL_COMMUNITY): Payer: Self-pay | Admitting: Pharmacy Technician

## 2020-06-22 ENCOUNTER — Inpatient Hospital Stay (HOSPITAL_COMMUNITY): Payer: Medicaid Other

## 2020-06-22 ENCOUNTER — Telehealth (HOSPITAL_COMMUNITY): Payer: Self-pay | Admitting: Pharmacist

## 2020-06-22 DIAGNOSIS — I272 Pulmonary hypertension, unspecified: Secondary | ICD-10-CM | POA: Diagnosis not present

## 2020-06-22 LAB — HEPATIC FUNCTION PANEL
ALT: 32 U/L (ref 0–44)
AST: 28 U/L (ref 15–41)
Albumin: 2.9 g/dL — ABNORMAL LOW (ref 3.5–5.0)
Alkaline Phosphatase: 50 U/L (ref 38–126)
Bilirubin, Direct: 0.1 mg/dL (ref 0.0–0.2)
Indirect Bilirubin: 0.7 mg/dL (ref 0.3–0.9)
Total Bilirubin: 0.8 mg/dL (ref 0.3–1.2)
Total Protein: 6 g/dL — ABNORMAL LOW (ref 6.5–8.1)

## 2020-06-22 LAB — POCT I-STAT EG7
Acid-Base Excess: 6 mmol/L — ABNORMAL HIGH (ref 0.0–2.0)
Acid-Base Excess: 6 mmol/L — ABNORMAL HIGH (ref 0.0–2.0)
Bicarbonate: 31.1 mmol/L — ABNORMAL HIGH (ref 20.0–28.0)
Bicarbonate: 30.7 mmol/L — ABNORMAL HIGH (ref 20.0–28.0)
Calcium, Ion: 1.17 mmol/L (ref 1.15–1.40)
Calcium, Ion: 1.15 mmol/L (ref 1.15–1.40)
HCT: 51 % — ABNORMAL HIGH (ref 36.0–46.0)
HCT: 49 % — ABNORMAL HIGH (ref 36.0–46.0)
Hemoglobin: 17.3 g/dL — ABNORMAL HIGH (ref 12.0–15.0)
Hemoglobin: 16.7 g/dL — ABNORMAL HIGH (ref 12.0–15.0)
O2 Saturation: 51 %
O2 Saturation: 63 %
Potassium: 4.1 mmol/L (ref 3.5–5.1)
Potassium: 4.2 mmol/L (ref 3.5–5.1)
Sodium: 137 mmol/L (ref 135–145)
Sodium: 138 mmol/L (ref 135–145)
TCO2: 32 mmol/L (ref 22–32)
TCO2: 32 mmol/L (ref 22–32)
pCO2, Ven: 44.4 mmHg (ref 44.0–60.0)
pCO2, Ven: 41.4 mmHg — ABNORMAL LOW (ref 44.0–60.0)
pH, Ven: 7.454 — ABNORMAL HIGH (ref 7.250–7.430)
pH, Ven: 7.477 — ABNORMAL HIGH (ref 7.250–7.430)
pO2, Ven: 26 mmHg — CL (ref 32.0–45.0)
pO2, Ven: 31 mmHg — CL (ref 32.0–45.0)

## 2020-06-22 LAB — CENTROMERE ANTIBODIES: Centromere Ab Screen: <0.2 AI (ref 0.0–0.9)

## 2020-06-22 LAB — BASIC METABOLIC PANEL
Anion gap: 10 (ref 5–15)
BUN: 22 mg/dL — ABNORMAL HIGH (ref 6–20)
CO2: 27 mmol/L (ref 22–32)
Calcium: 9.1 mg/dL (ref 8.9–10.3)
Chloride: 101 mmol/L (ref 98–111)
Creatinine, Ser: 1.34 mg/dL — ABNORMAL HIGH (ref 0.44–1.00)
GFR, Estimated: 54 mL/min — ABNORMAL LOW (ref >60)
Glucose, Bld: 87 mg/dL (ref 70–99)
Potassium: 4.6 mmol/L (ref 3.5–5.1)
Sodium: 138 mmol/L (ref 135–145)

## 2020-06-22 LAB — RHEUMATOID FACTOR: Rheumatoid fact SerPl-aCnc: 14.3 IU/mL — ABNORMAL HIGH (ref 0.0–13.9)

## 2020-06-22 LAB — ANTI-SCLERODERMA ANTIBODY: Scleroderma (Scl-70) (ENA) Antibody, IgG: <0.2 AI (ref 0.0–0.9)

## 2020-06-22 LAB — METANEPHRINES, PLASMA
Metanephrine, Free: 51.7 pg/mL (ref 0.0–88.0)
Normetanephrine, Free: 213.9 pg/mL — ABNORMAL HIGH (ref 0.0–110.1)

## 2020-06-22 LAB — T4, FREE: Free T4: 0.82 ng/dL (ref 0.61–1.12)

## 2020-06-22 MED ORDER — TADALAFIL 20 MG PO TABS
20.0000 mg | ORAL_TABLET | Freq: Every day | ORAL | 0 refills | Status: DC
Start: 2020-06-23 — End: 2020-06-22

## 2020-06-22 MED ORDER — METHYLPREDNISOLONE 4 MG PO TBPK: 8.0000 mg | ORAL_TABLET | Freq: Every evening | ORAL | Status: DC

## 2020-06-22 MED ORDER — AMBRISENTAN 5 MG PO TABS
5.0000 mg | ORAL_TABLET | Freq: Every day | ORAL | Status: DC
  Filled 2020-06-22: qty 1

## 2020-06-22 MED ORDER — TADALAFIL (PAH) 20 MG PO TABS: 20.0000 mg | ORAL_TABLET | Freq: Every day | ORAL | 11 refills | Status: DC

## 2020-06-22 MED ORDER — METHYLPREDNISOLONE 4 MG PO TBPK: 4.0000 mg | ORAL_TABLET | Freq: Four times a day (QID) | ORAL | Status: DC

## 2020-06-22 MED ORDER — AMBRISENTAN 5 MG PO TABS
5.0000 mg | ORAL_TABLET | Freq: Every day | ORAL | Status: DC
  Administered 2020-06-22 – 2020-06-23 (×2): 5 mg via ORAL
  Filled 2020-06-22 (×2): qty 1

## 2020-06-22 MED ORDER — METHYLPREDNISOLONE 4 MG PO TBPK
8.0000 mg | ORAL_TABLET | Freq: Every morning | ORAL | Status: AC
  Administered 2020-06-22: 8 mg via ORAL
  Filled 2020-06-22: qty 21

## 2020-06-22 MED ORDER — DIPHENHYDRAMINE HCL 50 MG/ML IJ SOLN
12.5000 mg | Freq: Once | INTRAMUSCULAR | Status: AC
  Administered 2020-06-22: 12.5 mg via INTRAVENOUS
  Filled 2020-06-22: qty 1

## 2020-06-22 MED ORDER — METHYLPREDNISOLONE 4 MG PO TBPK
4.0000 mg | ORAL_TABLET | ORAL | Status: AC
  Administered 2020-06-22: 4 mg via ORAL

## 2020-06-22 MED ORDER — METHYLPREDNISOLONE 4 MG PO TBPK
4.0000 mg | ORAL_TABLET | Freq: Three times a day (TID) | ORAL | Status: DC
  Administered 2020-06-23 (×2): 4 mg via ORAL

## 2020-06-22 MED ORDER — HYDRALAZINE HCL 25 MG PO TABS
25.0000 mg | ORAL_TABLET | Freq: Three times a day (TID) | ORAL | Status: DC
  Administered 2020-06-22 – 2020-06-23 (×3): 25 mg via ORAL
  Filled 2020-06-22 (×3): qty 1

## 2020-06-22 MED ORDER — AMBRISENTAN 5 MG PO TABS
5.0000 mg | ORAL_TABLET | Freq: Every day | ORAL | 0 refills | Status: DC
Start: 2020-06-22 — End: 2020-06-30

## 2020-06-22 NOTE — Telephone Encounter (Signed)
Patient Advocate Encounter   Received notification from [facility] that prior authorization for Zoe Roach is required.   PA submitted on CoverMyMeds Key [id number] Status is pending   Will continue to follow.

## 2020-06-22 NOTE — Progress Notes (Addendum)
Patient ID: Zoe Roach, female   DOB: February 22, 1991, 29 y.o.   MRN: [id number]    Advanced Heart Failure Rounding Note   Subjective:    Serum normetanephrine elevated at 213 pg/mL.  HCV ab reactive. H/o IVDU   ANA and viral panel negative. Other serologies still pending.   RHC done yesterday Procedural Findings: Hemodynamics (mmHg) RA mean 6 RV 99/5 PA 104/56, mean 74 PCWP mean 12 Oxygen saturations: SVC 52% PA 54% AO 98% Cardiac Output (Fick) 2.64  Cardiac Index (Fick) 1.41 PVR 23 WU  Transitioned to PO Lasix yesterday. Wt down 2 lb. SCr/K stable. Breathing ok. BP improved.   Skin rash has worsened. On upper and lower extremities. Pruritic.   Objective:   Weight Range:  Vital Signs:   Temp:  [97.5 F (36.4 C)-98.3 F (36.8 C)] 97.5 F (36.4 C) (11/09 0318) Pulse Rate:  [83-172] 91 (11/09 0318) Resp:  [7-20] 20 (11/09 0318) BP: (104-142)/(65-102) 121/99 (11/09 0318) SpO2:  [87 %-100 %] 99 % (11/09 0318) Weight:  [79.7 kg] 79.7 kg (11/09 0318) Last BM Date: 06/20/20  Weight change: Filed Weights   06/20/20 0354 06/21/20 0025 06/22/20 0318  Weight: 83.3 kg 80.3 kg 79.7 kg    Intake/Output:   Intake/Output Summary (Last 24 hours) at 06/22/2020 0738 Last data filed at 06/21/2020 2109 Gross per 24 hour  Intake 6 ml  Output 900 ml  Net -894 ml     PHYSICAL EXAM: General:  Well appearing, young female, moderately obese. No respiratory difficulty HEENT: normal Neck: supple. no JVD. Carotids 2+ bilat; no bruits. No lymphadenopathy or thyromegaly appreciated. Cor: PMI nondisplaced. Regular rate & rhythm. No rubs, gallops or murmurs. Lungs: clear Abdomen: soft, nontender, nondistended. No hepatosplenomegaly. No bruits or masses. Good bowel sounds. Extremities: no cyanosis, clubbing, edema, + diffuse urticaria bilateral upper and lower extremities  Neuro: alert & oriented x 3, cranial nerves grossly intact. moves all 4 extremities w/o difficulty. Affect  pleasant.   Telemetry: NSR-sinus tach, 90s-100s Personally reviewed   Labs: Basic Metabolic Panel: Recent Labs  Lab 06/18/20 1455 06/18/20 1455 06/18/20 1631 06/19/20 0406 06/19/20 0406 06/20/20 0252 06/21/20 0452 06/21/20 0927 06/21/20 0932 06/22/20 0407  NA 139   < >  --  137   < > 137 135 138 138 138  K 4.8   < >  --  5.5*   < > 4.1 4.3 4.2 4.2 4.6  CL 106  --   --  104  --  100 100  --   --  101  CO2 25  --   --  24  --  28 22  --   --  27  GLUCOSE 102*  --   --  91  --  97 95  --   --  87  BUN 19  --   --  20  --  18 22*  --   --  22*  CREATININE 1.09*   < > 1.06* 1.09*  --  1.20* 1.35*  --   --  1.34*  CALCIUM 8.6*   < >  --  8.5*   < > 8.7* 8.7*  --   --  9.1   < > = values in this interval not displayed.    Liver Function Tests: No results for input(s): AST, ALT, ALKPHOS, BILITOT, PROT, ALBUMIN in the last 168 hours. No results for input(s): LIPASE, AMYLASE in the last 168 hours. No results for input(s): AMMONIA in the last  168 hours.  CBC: Recent Labs  Lab 06/18/20 1455 06/18/20 1631 06/21/20 0927 06/21/20 0932  WBC 8.5 8.6  --   --   HGB 15.0 15.1* 17.3* 17.0*  HCT 47.8* 46.8* 51.0* 50.0*  MCV 92.1 91.1  --   --   PLT 141* 143*  --   --     Cardiac Enzymes: No results for input(s): CKTOTAL, CKMB, CKMBINDEX, TROPONINI in the last 168 hours.  BNP: BNP (last 3 results) Recent Labs    06/18/20 1631  BNP 905.3*    ProBNP (last 3 results) No results for input(s): PROBNP in the last 8760 hours.    Other results:  Imaging: CARDIAC CATHETERIZATION  Result Date: 06/21/2020 Severe pulmonary hypertension with low cardiac output.  Filling pressures normal.     Medications:     Scheduled Medications: . digoxin  0.0625 mg Oral Daily  . furosemide  60 mg Oral Daily  . gabapentin  200 mg Oral QHS  . heparin  5,000 Units Subcutaneous Q8H  . hydrALAZINE  50 mg Oral Q8H  . HYDROcodone-acetaminophen  1 tablet Oral Q6H  . levothyroxine  62.5  mcg Oral Q0600  . pantoprazole  20 mg Oral Daily  . sodium chloride flush  3 mL Intravenous Q12H  . sodium chloride flush  3 mL Intravenous Q12H  . spironolactone  12.5 mg Oral Daily  . tadalafil  20 mg Oral Daily    Infusions: . sodium chloride      PRN Medications: sodium chloride, acetaminophen, hydrocortisone cream, sodium chloride flush   Assessment/Plan:    1. Pulmonary hypertension/RV failure: Echo with normal LV size with mild LV hypertrophy, EF 50-55%,  D-shaped septum due to RV pressure/volume overload, severely dilated RV with moderately decreased RV systolic function, PASP 109 mmHg, dilated IVC. She has been symptomatic for months, suspect due to gradually worsening RV failure.  Recent CTA chest did not show significant lung pathology and did not show PE and V/Q scan negative.  No diet/weight loss drugs.  I am concerned for pulmonary arterial hypertension causing RV failure, primary pulmonary hypertension is very possible though need to rule out rheumatological diagnoses.  ANA and HIV negative.  She has no joint pains or abnormal rashes. BNP 905. HsTn 9. She has diuresed well.  RHC this admit showed severe pulmonary arterial hypertension with low cardiac output and normal filling pressures.  - Continue PO Lasix 60 mg daily.    - Anti-SCL70, anti-centromere Ab, RF are pending.  Added hepatitis panels in setting of IVDA history. HCV ab reactive => need to get abdominal US to assess for cirrhosis (unlikely, but could have portopulmonary hypertension). - Eventual sleep study.  - PFTs ordered.   - She will need aggressive treatment of PAH and given low output, may need IV therapy.   - Tadalafil 20 mg daily added. Plan to start Letairis 5 mg today.  HCG negative at admit, has IUD and will need monthly pregnancy tests with Letairis.  - continue digoxin 0.0625 daily.  2. Systemic HTN: Patient has had systemic HTN for some time.  She has a strong family history of this. She thinks  losartan and diltiazem caused facial swelling so has stopped them.  She thinks metoprolol caused a rash.  Concern for secondary HTN given young age (and family history).  - Plasma renin and aldosterone normal. BP improving.  - Serum normetanephrine elevated at 213 pg/mL with normal metanephrine.  This can be a false positive, need  24 hour  urine for catecholamines which is still in process.  - Renal artery dopplers 06/19/20 to look for fibromuscular dysplasia. LRAS 1-59%  O/w normal - Continue spironolactone 12.5 daily. K level ok.  - Continue hydralazine 25 mg tid (decreased from 50 tid as 2 doses held yesterday).   - No beta blocker with low output.  - Would ideally try her on amlodipine 5 mg daily eventually (not sure she had a true reaction to diltiazem, though I am concerned about possible allergic reaction to losartan).  3. Hypothyroidism: Continue Levoxyl (TSH was high this admission, per [facility]).   4. 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  - appreciate TRH's help - She is very defensive about any wean in her pain meds and I am concerned about ongoing drug-seeking behavior.  5. Hep C: HCV ab reactive. H/o IVDU.  - Will need referral to outpatient GI  - Send HCV viral load to confirm.  - RUQ [place] for cirrhosis.  6. Skin Rash: diffuse urticaria bilateral upper and lower extremities. No facial swelling. This was present at admission but now worse.  Do not think related to medications she has received in hospital.  - continue hydrocortisone cream  - can use benadryl if needed    Length of Stay: 4   Brittainy Delmer Islam 06/22/2020, 7:38 AM  Advanced Heart Failure Team Pager [phone] (M-F; 7a - 4p)  Please contact [facility] for night-coverage after hours (4p -7a ) and weekends on [email or web address]  Patient seen with PA, agree with the above note.   Breathing improved with diuresis, has not been out in hall yet.  Severe PAH on RHC yesterday.    Noted HCV Ab+.    Worsening rash arms/legs, had at admission.   General: NAD Neck: No JVD, no thyromegaly or thyroid nodule.  Lungs: Clear to auscultation bilaterally with normal respiratory effort. CV: Nondisplaced PMI.  Heart regular S1/S2 with loud P2, no S3/S4, no murmur.  No peripheral edema.   Abdomen: Soft, nontender, no hepatosplenomegaly, no distention.  Skin: Maculopapular rash arms/legs.  Neurologic: Alert and oriented x 3.  Psych: Normal affect. Extremities: No clubbing or cyanosis.  HEENT: Normal.   As above, continue tadalafil and start Letairis 5 mg daily.  Pregnancy test negative at admission.  Has IUD, will need monthly pregnancy tests.  Will need aggressive PAH treatment, start Uptravi as well as outpatient.  May need IV therapy with low cardiac output. Probably primary pulmonary hypertension but pending rheumatologic serologies.  Also needs PFTs and liver US.   Volume status ok, continue po Lasix.   HCV+, will send viral load and get RUQ [place].   Uncertain cause of rash, was present at admission but now worse.  Given presence at admission, suspect not meds used in hospital.   BP controlled, 2 doses hydralazine held yesterday so lower dose to 25 mg tid.  K ok on spironolactone.   Ambulate in hall.  Possibly home tomorrow.   Marca Ancona 06/22/2020 8:14 AM

## 2020-06-22 NOTE — Discharge Summary (Signed)
Advanced Heart Failure Team  Discharge Summary   Patient ID: Zoe MalletLeighah E Freid MRN: [id number], DOB/AGE: 09/30/1990 29 y.o. Admit date: 06/18/2020 D/C date:     06/23/2020   Primary Discharge Diagnoses:  1. Pulmonary hypertension/RV failure 2. Systemic HTN 3. Hypothyroidism  4. 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  5. Hep C: HCV ab reactive. H/o IVDU.  6. Skin Rash  Hospital Course:  MS Jean RosenthalJackson is a 29 year old female with newly diagnosed hypertension, history of hypothyroidism treated with levothyroxine, history of IV drug use and COVID-19 infection roughly 1 year ago, who was recently referred to general cardiology for evaluation of hypertension and abnormal EKG with findings consistent with right ventricular hypertrophy and RV strain.  She was initially placed on losartan for systemic hypertension but this was discontinued due to facial swelling concerning for angioedema.  She was seen by Dr. Mayford Knifeurner 06/15/2020.  At visit, patient complained of persistent dyspnea that has been present over the last year, following her COVID-19 infection.  She reports that over the last several weeks her dyspnea has gradually worsened.  Her blood pressure was also markedly elevated at 142/110.  She was started on Cardizem CD 180 mg daily and ordered to undergo additional testing to rule out secondary causes of hypertension given her young age. Renal dopplers have been ordered to r/o RAS,  along w/ 24 hour urine for catecholamines/metanephrines/VMA/dopamine/cortisol. Aldosterone, renin and PRA pending. She was also ordered to get an echocardiogram as well as a chest CT for further work-up for suspected pulmonary hypertension.  Chest CT done 11/3 was negative for PE but did show enlargement of the pulmonary artery as well as enlarged RV.  There is also suggestion of a background of mosaic attenuation of the lungs, which may represent air trapping or small airway disease. 2D echo was completed  earlier today showing severely enlarged RV w/ moderately reduced systolic dysfunction. RVSP markedly elevated at 108.7 mmHg. LVEF normal 50-55%. Unfortunately, she endorsed additional facial swelling w/ Cardizem and this was discontinued on 11/3 and she was started on Toprol XL.   Admitted with increased work of breathing and hypertension. Started on IV lasix and later had RHC that showed low cardiac output and  severe pulmonary hypertension.  Started on combination therapy with adcirca + letaris. Pregnancy test negative this admit. She has IUD. Hepatitis panel reactive for HCV. Referred to ID for treatment for follow up. [facility]   [facility] consulted for chronic pain. At one point she was addicted to heroin and quit 3 1/2 years ago and also abused neurontin. Most recently given narcotics for tooth pain by her dentist. She has follow up at discharge. She quit about 3 1/2 years ago, after attending a year-long program; she is also involved in NA and has a sponsor.   Plan to follow closely in the HF clinic with Dr Shirlee LatchMcLean.     Items for follow up-->-Home today. She will continue to be followed closely in the HF clinic. - Items for follow up--> Continue Uptravi approval process, sleep study, ID follow up for Hep C.   -I have personally reached out to ID and they will contact her for follow up.  See below for detailed problem list.   1. Pulmonary hypertension/RV failure: Echo with normal LV size with mild LV hypertrophy, EF 50-55%, D-shaped septum due to RV pressure/volume overload, severely dilated RV with moderately decreased RV systolic function, PASP 109 mmHg, dilated IVC. She has  been symptomatic for months, suspect due to gradually worsening RV failure. Recent CTA chest did not show significant lung pathology and did not show PE and V/Q scan negative. No diet/weight loss drugs. I am concerned for pulmonary arterial hypertension causing RV failure, primary  pulmonary hypertension is very possible though need to rule out rheumatological diagnoses.  ANA and HIV negative. She has no joint pains or abnormal rashes. BNP 905. HsTn 9. She has diuresed well.  RHC this admit showed severe pulmonary arterial hypertension with low cardiac output and normal filling pressures.  - Continue PO Lasix 60 mg daily.   No spiro with elevated K.  - Anti-SCL70, anti-centromere Ab, RF are pending.Added hepatitis panels in setting of IVDA history. HCV ab reactive => need to get abdominal US to assess for cirrhosis (unlikely, but could have portopulmonary hypertension). - Eventual sleep study.  - PFTs ordered.   - She will need aggressive treatment of PAH and given low output, may need IV therapy.   - Tadalafil 40 mg daily added. Starting letairis 5 mg daily. HCG negative at admit, has IUD and will need monthly pregnancy tests with Letairis.  - continue digoxin 0.0625 daily.  2. Systemic HTN: Patient has had systemic HTN for some time. She has a strong family history of this. She thinks losartan and diltiazem caused facial swelling so has stopped them. She thinks metoprolol caused a rash. Concern for secondary HTN given young age (and family history).  - Plasma renin and aldosterone normal. BP improving.  - Serum normetanephrine elevated at 213 pg/mL with normal metanephrine.  This can be a false positive, need  24 hour urine for catecholamines which is still in process.  - Renal artery dopplers 06/19/20 to look for fibromuscular dysplasia. LRAS 1-59%  O/w normal - Continue hydralazine 25 mg tid (decreased from 50 tid as 2 doses held yesterday).  - No beta blocker with low output.  - Would ideally try her on amlodipine 5 mg daily eventually (not sure she had a true reaction to diltiazem, though I am concerned about possible allergic reaction to losartan).  3. Hypothyroidism: Continue Levoxyl (TSH was high this admission, per [facility]).   4. H/o IVDA (previous heroin addict)  with chronic pain issues.  - appreciate [facility]'s help -She has NA sponsor.  5. Hep C: HCV ab reactive. H/O IVDU.  -  HCV viral load sent and pending.   - RUQ [place] for cirrhosis.  - ID contacted for new patient referral.  6. Skin Rash: diffuse urticaria bilateral upper and lower extremities. No facial swelling. This was present at admission but now worse.  Do not think related to medications she has received in hospital.  - continue hydrocortisone cream  - can use benadryl if needed   Discharge Vitals: Blood pressure 101/74, pulse 68, temperature 97.8 F (36.6 C), temperature source Oral, resp. rate 18, height 5\' 5"  (1.651 m), weight 79.4 kg, SpO2 97 %.  Labs: Lab Results  Component Value Date   WBC 8.6 06/18/2020   HGB 17.0 (H) 06/21/2020   HCT 50.0 (H) 06/21/2020   MCV 91.1 06/18/2020   PLT 143 (L) 06/18/2020    Recent Labs  Lab 06/22/20 0407 06/22/20 0407 06/23/20 0340  NA 138   < > 136  K 4.6   < > 5.0  CL 101   < > 104  CO2 27   < > 20*  BUN 22*   < > 29*  CREATININE 1.34*   < >  1.16*  CALCIUM 9.1   < > 9.1  PROT 6.0*  --   --   BILITOT 0.8  --   --   ALKPHOS 50  --   --   ALT 32  --   --   AST 28  --   --   GLUCOSE 87   < > 91   < > = values in this interval not displayed.   No results found for: CHOL, HDL, LDLCALC, TRIG BNP (last 3 results) Recent Labs    06/18/20 1631  BNP 905.3*    ProBNP (last 3 results) No results for input(s): PROBNP in the last 8760 hours.   Diagnostic Studies/Procedures   US Abdomen Limited RUQ (LIVER/GB)  Result Date: 06/22/2020 CLINICAL DATA:  Hepatitis C EXAM: ULTRASOUND ABDOMEN LIMITED RIGHT UPPER QUADRANT COMPARISON:  06/16/2020 FINDINGS: Gallbladder: No gallstones or wall thickening visualized. No sonographic Murphy sign noted by sonographer. Common bile duct: Diameter: 5 mm Liver: No focal lesion identified. Within normal limits in parenchymal echogenicity. Portal vein is patent on color Doppler imaging with normal direction  of blood flow towards the liver. Other: None. IMPRESSION: 1. Unremarkable right upper quadrant ultrasound. Electronically Signed   By: Sharlet Salina M.D.   On: 06/22/2020 17:59  RA mean 6 RV 99/5 PA 104/56, mean 74 PCWP mean 12 Oxygen saturations: SVC 52% PA 54% AO 98% Cardiac Output (Fick) 2.64  Cardiac Index (Fick) 1.41 PVR 23 WU  Discharge Medications   Allergies as of 06/23/2020      Reactions   Almond (diagnostic) Hives   Cherry Hives   Diltiazem Other (See Comments)   Swollen face   Losartan Swelling   Peach [prunus Persica] Hives   Metoprolol Rash      Medication List    STOP taking these medications   amphetamine-dextroamphetamine 15 MG tablet Commonly known as: ADDERALL   budesonide-formoterol 160-4.5 MCG/ACT inhaler Commonly known as: SYMBICORT   diltiazem 180 MG 24 hr capsule Commonly known as: CARDIZEM CD   metoprolol succinate 25 MG 24 hr tablet Commonly known as: Toprol XL     TAKE these medications   ambrisentan 5 MG tablet Commonly known as: LETAIRIS Take 1 tablet (5 mg total) by mouth daily.   Digoxin 62.5 MCG Tabs Take 0.0625 mg by mouth daily.   ECHINACEA ACZ PO Take 1 tablet by mouth daily.   Fish Oil 1000 MG Caps Take 1,000 mg by mouth daily.   furosemide 40 MG tablet Commonly known as: LASIX Take 1.5 tablets (60 mg total) by mouth daily.   hydrALAZINE 25 MG tablet Commonly known as: APRESOLINE Take 1 tablet (25 mg total) by mouth every 8 (eight) hours.   HYDROcodone-acetaminophen 10-325 MG tablet Commonly known as: NORCO Take 0.5 tablets by mouth 4 (four) times daily as needed for moderate pain.   hydrocortisone cream 1 % Apply topically 3 (three) times daily as needed for itching.   ibuprofen 800 MG tablet Commonly known as: ADVIL Take 400 mg by mouth every 6 (six) hours as needed for moderate pain.   lansoprazole 15 MG capsule Commonly known as: PREVACID Take 15 mg by mouth daily.   levonorgestrel 20 MCG/24HR  IUD Commonly known as: MIRENA 1 each by Intrauterine route once.   levothyroxine 25 MCG tablet Commonly known as: SYNTHROID Take 12.5 mcg by mouth See admin instructions. Take 12.53mcg(0.5 tab) once daily with tablet. Total daily dose=62.60mcg   levothyroxine 50 MCG tablet Commonly known as: SYNTHROID Take 50  mcg by mouth See admin instructions. Take one once daily with 1/2 tablet of the tablet. Total daily dose=62.42mcg   methylPREDNISolone 4 MG Tbpk tablet Commonly known as: MEDROL DOSEPAK Dose Pack   tadalafil (PAH) 20 MG tablet Commonly known as: ADCIRCA Take 2 tablets (40 mg total) by mouth daily.   vitamin C 1000 MG tablet Take 1,000 mg by mouth daily.   zinc gluconate 50 MG tablet Take 50 mg by mouth daily.       Disposition   The patient will be discharged in stable condition to home. Discharge Instructions    (HEART FAILURE PATIENTS) Call MD:  Anytime you have any of the following symptoms: 1) 3 pound weight gain in 24 hours or 5 pounds in 1 week 2) shortness of breath, with or without a dry hacking cough 3) swelling in the hands, feet or stomach 4) if you have to sleep on extra pillows at night in order to breathe.   Complete by: As directed    Diet - low sodium heart healthy   Complete by: As directed    Increase activity slowly   Complete by: As directed       Follow-up Information    [facility] Follow up on 06/30/2020.   Specialty: Cardiology Why: at 1000 Bring all medications.  Contact information: [address] [id number] [place] [address] [phone]       [facility]              Follow up.   Why: They will call and set up an appointment.  Contact information: [address] [place] [address]                Duration of Discharge Encounter: Greater than 35 minutes   Signed, Tonye Becket  NP-C   06/23/2020, 9:18 AM

## 2020-06-22 NOTE — Consult Note (Signed)
.                Medical Consultation   Zoe Roach  RJJ:[id number]  DOB: Nov 15, 1990  DOA: 06/18/2020  PCP: Leilani Able, MD   Outpatient Specialists:  None   Requesting physician: Bensimhon - cardiology  Reason for consultation: Chronic pain.   History of Present Illness: Zoe Roach is an 29 y.o. female with h/o IVDA; hypothyroidism; and recently diagnosed HTN who presented to cardiology due to RVH/R heart strain on EKG.  Echo was performed on showed severely enlarged RV with moderately reduced systolic function and marked pulmonary HTN.  She reports that she went to the dentist for dental extraction.  BP was 211/160 without h/o prior personal HTN.  She went to the ER Friday and was referred to cardiology Monday and they sent her here for right-sided heart failure with pulmonary HTN - hereditary vs. Idiopathic.  She has had progressive DOE and is not able to do as much activity.  She did have COVID a year ago.  The dentist gave her Norco 10 mg - taking 1/2 tab q5h prn due to dental pain.  She reports that this is "minimum" to keep the pain under control.  They have been giving it every 8 hours and this has made her unable to get the pain controlled.  She cannot get the dental procedure done until after the heart issue is settled.  Prior to this issue she was taking Ibuprofen.  She saw an endodontist yesterday and he does not think the tooth needs to come out, needs further testing; he suggested that this may be related to nerve pain which will improve spontaneously over time.  She has been taking the Norco for maybe 1-2 weeks.  She is very forthcoming about her h/o heroin dependence.  She quit about 3 1/2 years ago, after attending a year-long program; she is also involved in NA and has a sponsor.  She did abuse Neurontin periodically in the past, remotely.   She has been in touch with her sponsor regarding her need for opiate pain medication at this time and is concerned about  this, but also cannot bear the pain.   Review of Systems:  Review of Systems  HENT: Negative.        TEETH PAIN.  Eyes: Negative.   Respiratory: Negative.   Cardiovascular: Negative.   Skin: Negative.   Neurological: Negative.   All other systems reviewed and are negative.  Past Medical History: Past Medical History:  Diagnosis Date  . History of COVID-19   . Hypothyroidism (acquired)   . IVDU (intravenous drug user)   . SOB (shortness of breath)     Past Surgical History: Past Surgical History:  Procedure Laterality Date  . CESAREAN SECTION    . RIGHT HEART CATH N/A 06/21/2020   Procedure: RIGHT HEART CATH;  Surgeon: Laurey Morale, MD;  Location: [facility] INVASIVE CV LAB;  Service: Cardiovascular;  Laterality: N/A;     Allergies:  Allergies  Allergen Reactions  . Almond (Diagnostic) Hives  . Cherry Hives  . Diltiazem Other (See Comments)    Swollen face  . Losartan Swelling  . Peach [Prunus Persica] Hives  . Metoprolol Rash     Social History:  reports that she has been smoking. She has a 15.00 pack-year smoking history. She has never used smokeless tobacco. She reports current alcohol use. She reports current drug use.   Family History: Family History  Problem Relation Age  of Onset  . Hypertension Paternal Grandfather   . Hypertension Mother     Physical Exam: Vitals:   06/22/20 0318 06/22/20 0800 06/22/20 0818 06/22/20 1141  BP: (!) 121/99  (!) 125/95 110/67  Pulse: 91  98 90  Resp: 20 18 16 18   Temp: (!) 97.5 F (36.4 C)  97.9 F (36.6 C) 98.3 F (36.8 C)  TempSrc: Oral  Oral Oral  SpO2: 99% 95% 98% 97%  Weight: 79.7 kg     Height:        Physical Exam Vitals and nursing note reviewed.  HENT:     Head: Normocephalic and atraumatic.     Right Ear: External ear normal.     Left Ear: External ear normal.  Eyes:     Extraocular Movements: Extraocular movements intact.  Cardiovascular:     Rate and Rhythm: Normal rate and regular rhythm.      Heart sounds: Murmur heard.   Pulmonary:     Effort: Pulmonary effort is normal.     Breath sounds: Normal breath sounds.  Abdominal:     Palpations: Abdomen is soft.  Skin:    General: Skin is warm.     Findings: Rash present. Rash is macular and papular.     Comments: Erythematous, pruritic MP , morbilliform rash on all ext .   Neurological:     Mental Status: She is alert and oriented to person, place, and time.    Data reviewed:  I have personally reviewed following labs and imaging studies Labs:  CBC: Recent Labs  Lab 06/18/20 1455 06/18/20 1631 06/21/20 0927 06/21/20 0932  WBC 8.5 8.6  --   --   HGB 15.0 15.1* 17.3* 17.0*  HCT 47.8* 46.8* 51.0* 50.0*  MCV 92.1 91.1  --   --   PLT 141* 143*  --   --     Basic Metabolic Panel: Recent Labs  Lab 06/18/20 1455 06/18/20 1455 06/18/20 1631 06/19/20 0406 06/19/20 0406 06/20/20 0252 06/20/20 0252 06/21/20 0452 06/21/20 0452 06/21/20 0927 06/21/20 0927 06/21/20 0932 06/22/20 0407  NA 139   < >  --  137   < > 137  --  135  --  138  --  138 138  K 4.8   < >  --  5.5*   < > 4.1   < > 4.3   < > 4.2   < > 4.2 4.6  CL 106  --   --  104  --  100  --  100  --   --   --   --  101  CO2 25  --   --  24  --  28  --  22  --   --   --   --  27  GLUCOSE 102*  --   --  91  --  97  --  95  --   --   --   --  87  BUN 19  --   --  20  --  18  --  22*  --   --   --   --  22*  CREATININE 1.09*   < > 1.06* 1.09*  --  1.20*  --  1.35*  --   --   --   --  1.34*  CALCIUM 8.6*  --   --  8.5*  --  8.7*  --  8.7*  --   --   --   --  9.1   < > = values in this interval not displayed.   GFR Estimated Creatinine Clearance: 63.5 mL/min (A) (by C-G formula based on SCr of 1.34 mg/dL (H)). Liver Function Tests: Recent Labs  Lab 06/22/20 0407  AST 28  ALT 32  ALKPHOS 50  BILITOT 0.8  PROT 6.0*  ALBUMIN 2.9*   No results for input(s): LIPASE, AMYLASE in the last 168 hours. No results for input(s): AMMONIA in the last 168  hours. Coagulation profile No results for input(s): INR, PROTIME in the last 168 hours.  Cardiac Enzymes: No results for input(s): CKTOTAL, CKMB, CKMBINDEX, TROPONINI in the last 168 hours. BNP: Invalid input(s): POCBNP CBG: No results for input(s): GLUCAP in the last 168 hours. D-Dimer No results for input(s): DDIMER in the last 72 hours. Hgb A1c No results for input(s): HGBA1C in the last 72 hours. Lipid Profile No results for input(s): CHOL, HDL, LDLCALC, TRIG, CHOLHDL, LDLDIRECT in the last 72 hours. Thyroid function studies Recent Labs    06/21/20 1130  TSH 22.264*   Anemia work up No results for input(s): VITAMINB12, FOLATE, FERRITIN, TIBC, IRON, RETICCTPCT in the last 72 hours. Urinalysis No results found for: COLORURINE, APPEARANCEUR, LABSPEC, PHURINE, GLUCOSEU, HGBUR, BILIRUBINUR, KETONESUR, PROTEINUR, UROBILINOGEN, NITRITE, LEUKOCYTESUR   Sepsis Labs Invalid input(s): PROCALCITONIN,  WBC,  LACTICIDVEN Microbiology Recent Results (from the past 240 hour(s))  Respiratory Panel by RT PCR (Flu A&B, Covid) - Nasopharyngeal Swab     Status: None   Collection Time: 06/19/20 12:02 AM   Specimen: Nasopharyngeal Swab  Result Value Ref Range Status   SARS Coronavirus 2 by RT PCR NEGATIVE NEGATIVE Final    Comment: (NOTE) SARS-CoV-2 target nucleic acids are NOT DETECTED.  The SARS-CoV-2 RNA is generally detectable in upper respiratoy specimens during the acute phase of infection. The lowest concentration of SARS-CoV-2 viral copies this assay can detect is 131 copies/mL. A negative result does not preclude SARS-Cov-2 infection and should not be used as the sole basis for treatment or other patient management decisions. A negative result may occur with  improper specimen collection/handling, submission of specimen other than nasopharyngeal swab, presence of viral mutation(s) within the areas targeted by this assay, and inadequate number of viral copies (<131 copies/mL). A  negative result must be combined with clinical observations, patient history, and epidemiological information. The expected result is Negative.  Fact Sheet for Patients:  https://www.moore.com/  Fact Sheet for Healthcare Providers:  https://www.young.biz/  This test is no t yet approved or cleared by the [place] FDA and  has been authorized for detection and/or diagnosis of SARS-CoV-2 by FDA under an Emergency Use Authorization (EUA). This EUA will remain  in effect (meaning this test can be used) for the duration of the COVID-19 declaration under Section 564(b)(1) of the Act, 21 U.S.C. section 360bbb-3(b)(1), unless the authorization is terminated or revoked sooner.     Influenza A by PCR NEGATIVE NEGATIVE Final   Influenza B by PCR NEGATIVE NEGATIVE Final    Comment: (NOTE) The Xpert Xpress SARS-CoV-2/FLU/RSV assay is intended as an aid in  the diagnosis of influenza from Nasopharyngeal swab specimens and  should not be used as a sole basis for treatment. Nasal washings and  aspirates are unacceptable for Xpert Xpress SARS-CoV-2/FLU/RSV  testing.  Fact Sheet for Patients: https://www.moore.com/  Fact Sheet for Healthcare Providers: https://www.young.biz/  This test is not yet approved or cleared by the [place] FDA and  has been authorized for detection and/or diagnosis of SARS-CoV-2  by  FDA under an Emergency Use Authorization (EUA). This EUA will remain  in effect (meaning this test can be used) for the duration of the  Covid-19 declaration under Section 564(b)(1) of the Act, 21  U.S.C. section 360bbb-3(b)(1), unless the authorization is  terminated or revoked. Performed at [facility], [address] [place], [place] [address]     Inpatient Medications:   Scheduled Meds: . ambrisentan  5 mg Oral Daily  . digoxin  0.0625 mg Oral Daily  . furosemide  60 mg Oral Daily  .  gabapentin  200 mg Oral QHS  . heparin  5,000 Units Subcutaneous Q8H  . hydrALAZINE  25 mg Oral Q8H  . HYDROcodone-acetaminophen  1 tablet Oral Q6H  . levothyroxine  62.5 mcg Oral Q0600  . methylPREDNISolone  4 mg Oral PC lunch  . methylPREDNISolone  4 mg Oral PC supper  . [START ON 06/23/2020] methylPREDNISolone  4 mg Oral 3 x daily with food  . [START ON 06/24/2020] methylPREDNISolone  4 mg Oral 4X daily taper  . methylPREDNISolone  8 mg Oral Nightly  . [START ON 06/23/2020] methylPREDNISolone  8 mg Oral Nightly  . pantoprazole  20 mg Oral Daily  . sodium chloride flush  3 mL Intravenous Q12H  . sodium chloride flush  3 mL Intravenous Q12H  . spironolactone  12.5 mg Oral Daily  . tadalafil  20 mg Oral Daily   Continuous Infusions: . sodium chloride      Radiological Exams on Admission: CARDIAC CATHETERIZATION  Result Date: 06/21/2020 Severe pulmonary hypertension with low cardiac output.  Filling pressures normal.    Impression/Recommendations Pulmonary hypertension, unspecified (HCC) -pt to have rt heart cath no h/o diet pills , maybe mild sleep apnea - per cardiology etiology under evaln.  Pain, dental -cont current pain medicine.Consider only prn tramadol, gabapentin is helping her pain currently.  -pain is stable.  History of narcotic addiction (HCC) -d/w pt about long term complication to be aware about due to drugs abuse, exp nephrology and pulmonary. No change.  Tobacco dependence D/w pt about tobacco cessation and its effects on pulmonary, cardiac and nephrology.Marland Kitchen.  AKI (acute kidney injury) (HCC) Pt  Lab Results  Component Value Date   CREATININE 1.34 (H) 06/22/2020   CREATININE 1.35 (H) 06/21/2020   CREATININE 1.20 (H) 06/20/2020  Avoid nephrotoxic agent or contrast, Lasix po now  And will follow.  We will cont to follow.   HTN (hypertension) Pt is on lasix and hydralazine. Blood pressure 110/67, pulse 90, temperature 98.3 F (36.8 C), temperature  source Oral, resp. rate 18, height 5\' 5"  (1.651 m), weight 79.7 kg, SpO2 97 %. As we see BP is well controlled. We will continue this regimen and eventually ACEI/ARB When kidney function is back to baseline. Blood pressure 110/67, pulse 90, temperature 98.3 F (36.8 C), temperature source Oral, resp. rate 18, height 5\' 5"  (1.651 m), weight 79.7 kg, SpO2 97 %. Bp is excellent. No change.    Rash: -pt has rash that was initially on left thigh initially on the day I saw her she did not have any complaints with it and now it has progressed to affecting all four ext and almost spares torso. D/D include drug rash vs viral etiology I have ordered EBV serology.Other rheum labs pending.  But did d/w pt about low dose steroid taper to ensure it doe snot progress and one time benadryl 12.5 mg given to her. D/w pt to start OP followup  visit planning with  Pulmonary and also for sleep eval. O/P f/u with allergist to eval for allergies.  O/P f/u with pcp.    Thank you for this consultation.  Our [facility] hospitalist team will follow the patient with you.   Time Spent:  Gertha Calkin M.D. [phone] [facility] 06/22/2020, 1:07 PM

## 2020-06-22 NOTE — Telephone Encounter (Signed)
Sent in Patient Enrollment application to [employer] for Letairis.   Sent in Ambrisentan REMS application to Ambrisentan REMS Program. Patient had a documented negative pregnancy test prior to approval and received the Guide for Female Patients.    Application pending, will continue to follow.   Karle Plumber, PharmD, BCPS, BCCP, CPP Heart Failure Clinic Pharmacist [phone]

## 2020-06-22 NOTE — Telephone Encounter (Signed)
Advanced Heart Failure Patient Advocate Encounter  Prior Authorization for [name] has been approved.    PA# [id number] Effective dates: 06/22/20 through 06/22/21  Archer Asa, CPhT

## 2020-06-22 NOTE — Telephone Encounter (Signed)
Received message that patient has completed enrollment for LEAP. LEAP ID is [id number]. Referral has been sent to [facility].  Karle Plumber, PharmD, BCPS, BCCP, CPP Heart Failure Clinic Pharmacist [phone]

## 2020-06-22 NOTE — Progress Notes (Signed)
Pt had positive hep C ab. HF team request to add confirmation lab. We will add hep C VL reflex to genotype if positive.  Ulyses Southward, PharmD, BCIDP, AAHIVP, CPP Infectious Disease Pharmacist 06/22/2020 8:24 AM

## 2020-06-22 NOTE — Progress Notes (Signed)
Pharmacy Consult for Pulmonary Hypertension Treatment   Indication - Initiation of PAH medication while hospitalized  Patient is 29 y.o. and will be newly started on Ambrisentan Milda Smart).   In order to use this medication as an inpatient, monitoring parameters per REMS requirements must be met.  1. Chronic therapy will under the supervision of Loralie Champagne (MD name) who is enrolled in the REMS program and is initiating therapy.  2. On admission pregnancy risk has been assessed and pregnancy test dated 06/18/20 was negative. 3. Hepatic function has been evaluated. AST/ALT appropriate to continue medication at this time.   Hepatic Function Latest Ref Rng & Units 06/22/2020  Total Protein 6.5 - 8.1 g/dL 6.0(L)  Albumin 3.5 - 5.0 g/dL 2.9(L)  AST 15 - 41 U/L 28  ALT 0 - 44 U/L 32  Alk Phosphatase 38 - 126 U/L 50  Total Bilirubin 0.3 - 1.2 mg/dL 0.8  Bilirubin, Direct 0.0 - 0.2 mg/dL 0.1     The patient has been educated on Pregnancy Risk/hepatotoxicity and REMS form has been signed and submitted to [employer].  If any question arise or pregnancy is identified during hospitalization, contact for bosentan and macitentan: [phone]; ambrisentan: [phone].  Thank for you allowing [place] to participate in the care of this patient.  Arrie Senate, PharmD, BCPS Clinical Pharmacist [phone] Please check AMION for all [place] numbers 06/22/2020

## 2020-06-23 ENCOUNTER — Inpatient Hospital Stay (HOSPITAL_COMMUNITY): Payer: Medicaid Other

## 2020-06-23 ENCOUNTER — Other Ambulatory Visit (HOSPITAL_COMMUNITY): Payer: Self-pay | Admitting: Adult Health

## 2020-06-23 DIAGNOSIS — I272 Pulmonary hypertension, unspecified: Secondary | ICD-10-CM | POA: Diagnosis not present

## 2020-06-23 LAB — BASIC METABOLIC PANEL
Anion gap: 12 (ref 5–15)
BUN: 29 mg/dL — ABNORMAL HIGH (ref 6–20)
CO2: 20 mmol/L — ABNORMAL LOW (ref 22–32)
Calcium: 9.1 mg/dL (ref 8.9–10.3)
Chloride: 104 mmol/L (ref 98–111)
Creatinine, Ser: 1.16 mg/dL — ABNORMAL HIGH (ref 0.44–1.00)
GFR, Estimated: >60 mL/min (ref >60)
Glucose, Bld: 91 mg/dL (ref 70–99)
Potassium: 5 mmol/L (ref 3.5–5.1)
Sodium: 136 mmol/L (ref 135–145)

## 2020-06-23 LAB — CATECHOLAMINES,UR.,FREE,24 HR
Dopamine, Rand Ur: 46 ug/L
Dopamine, Ur, 24Hr: 162 ug/24 hr (ref 0–510)
Epinephrine, Rand Ur: <1 ug/L
Epinephrine, U, 24Hr: <4 ug/24 hr (ref 0–20)
Norepinephrine, Rand Ur: 34 ug/L
Norepinephrine,U,24H: 120 ug/24 hr (ref 0–135)
Total Volume: 3525

## 2020-06-23 LAB — HCV RNA QUANT RFLX ULTRA OR GENOTYP
HCV RNA Qnt(log copy/mL): UNDETERMINED log10 IU/mL
HepC Qn: NOT DETECTED IU/mL

## 2020-06-23 LAB — PULMONARY FUNCTION TEST
DL/VA % pred: 96 %
DL/VA: 4.39 ml/min/mmHg/L
DLCO cor % pred: 112 %
DLCO cor: 25.99 ml/min/mmHg
DLCO unc % pred: 117 %
DLCO unc: 27.25 ml/min/mmHg
FEF 25-75 Pre: 4.9 L/sec
FEF2575-%Pred-Pre: 140 %
FEV1-%Pred-Pre: 116 %
FEV1-Pre: 3.8 L
FEV1FVC-%Pred-Pre: 100 %
FEV6-%Pred-Pre: 116 %
FEV6-Pre: 4.47 L
FEV6FVC-%Pred-Pre: 100 %
FVC-%Pred-Pre: 114 %
FVC-Pre: 4.47 L
Pre FEV1/FVC ratio: 85 %
Pre FEV6/FVC Ratio: 100 %
RV % pred: 108 %
RV: 1.58 L
TLC % pred: 119 %
TLC: 6.23 L

## 2020-06-23 LAB — CORTISOL, URINE, FREE
Cortisol (Ur), Free: 10 ug/24 hr (ref 6–42)
Cortisol,F,ug/L,U: 9 ug/L

## 2020-06-23 MED ORDER — TADALAFIL 20 MG PO TABS
40.0000 mg | ORAL_TABLET | Freq: Every day | ORAL | Status: DC
  Administered 2020-06-23: 40 mg via ORAL
  Filled 2020-06-23 (×2): qty 2

## 2020-06-23 MED ORDER — METHYLPREDNISOLONE 4 MG PO TBPK: ORAL_TABLET | ORAL | 0 refills | Status: DC

## 2020-06-23 MED ORDER — HYDROCORTISONE 1 % EX CREA: TOPICAL_CREAM | Freq: Three times a day (TID) | CUTANEOUS | 0 refills | Status: DC | PRN

## 2020-06-23 MED ORDER — HYDRALAZINE HCL 25 MG PO TABS: 25.0000 mg | ORAL_TABLET | Freq: Three times a day (TID) | ORAL | 6 refills | Status: DC

## 2020-06-23 MED ORDER — DIGOXIN 62.5 MCG PO TABS: 0.0625 mg | ORAL_TABLET | Freq: Every day | ORAL | 6 refills | Status: DC

## 2020-06-23 MED ORDER — TADALAFIL (PAH) 20 MG PO TABS: 40.0000 mg | ORAL_TABLET | Freq: Every day | ORAL | 6 refills | Status: DC

## 2020-06-23 MED ORDER — FUROSEMIDE 40 MG PO TABS: 60.0000 mg | ORAL_TABLET | Freq: Every day | ORAL | 6 refills | Status: DC

## 2020-06-23 MED FILL — HYDROCORTISONE 1% CREAM: 1 | 5 days supply | Qty: 30 | Fill #0

## 2020-06-23 MED FILL — TADALAFIL (PAH) 20 MG TABS: 20 | 15 days supply | Qty: 30 | Fill #0

## 2020-06-23 MED FILL — hydrALAZINE HCL 25 MG TABS: 25 | 30 days supply | Qty: 90 | Fill #0

## 2020-06-23 MED FILL — DIGOXIN 0.125 MG TABLET: 125 | 60 days supply | Qty: 30 | Fill #0

## 2020-06-23 MED FILL — METHYLPREDNISOLONE 4 MG DOS: 4 | 7 days supply | Qty: 21 | Fill #0

## 2020-06-23 MED FILL — FUROSEMIDE 40 MG TABLET: 40 | 30 days supply | Qty: 45 | Fill #0

## 2020-06-23 NOTE — Progress Notes (Addendum)
Pt has been walking with her husband and cleaning up her room. She did get quite tired cleaning room but felt well walking. Discussed HF booklet, daily wts, low sodium diet, fluid restrictions and smoking cessation. Pt and husband very receptive with many appropriate questions. Gave walking guidelines and agreed with light yoga. She is motivated to quit smoking (3-4 cigarettes a day). 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 Told her about Pulmonary Rehab and she can be referred from office if she becomes interested. [phone] Ethelda Chick CES, ACSM 12:01 PM 06/23/2020

## 2020-06-23 NOTE — Telephone Encounter (Signed)
Patient Advocate Encounter   Received notification from [facility] Medicaid that prior authorization for Zoe Roach is required.   PA submitted on CoverMyMeds Key [id number] Status is pending   Will continue to follow.   Karle Plumber, PharmD, BCPS, BCCP, CPP Heart Failure Clinic Pharmacist [phone]

## 2020-06-23 NOTE — Plan of Care (Signed)

## 2020-06-23 NOTE — Progress Notes (Addendum)
Patient ID: TAL NEER, female   DOB: 1991/02/03, 29 y.o.   MRN: [id number]    Advanced Heart Failure Rounding Note   Subjective:    Serum normetanephrine elevated at 213 pg/mL.  HCV ab reactive. H/o IVDU   ANA and viral panel negative. Other serologies still pending.   RHC done yesterday Procedural Findings: Hemodynamics (mmHg) RA mean 6 RV 99/5 PA 104/56, mean 74 PCWP mean 12 Oxygen saturations: SVC 52% PA 54% AO 98% Cardiac Output (Fick) 2.64  Cardiac Index (Fick) 1.41 PVR 23 WU  Wants to go home. Denies SOB    Objective:   Weight Range:  Vital Signs:   Temp:  [97.5 F (36.4 C)-98.3 F (36.8 C)] 97.8 F (36.6 C) (11/10 0457) Pulse Rate:  [68-100] 68 (11/10 0457) Resp:  [17-18] 18 (11/10 0457) BP: (96-144)/(67-114) 101/74 (11/10 0457) SpO2:  [95 %-98 %] 97 % (11/10 0457) Weight:  [79.4 kg] 79.4 kg (11/10 0500) Last BM Date: 06/20/20  Weight change: Filed Weights   06/21/20 0025 06/22/20 0318 06/23/20 0500  Weight: 80.3 kg 79.7 kg 79.4 kg    Intake/Output:   Intake/Output Summary (Last 24 hours) at 06/23/2020 0837 Last data filed at 06/22/2020 1700 Gross per 24 hour  Intake 240 ml  Output 2500 ml  Net -2260 ml     PHYSICAL EXAM: General:  Well appearing. No resp difficulty HEENT: normal Neck: supple. no JVD. Carotids 2+ bilat; no bruits. No lymphadenopathy or thryomegaly appreciated. Cor: PMI nondisplaced. Regular rate & rhythm. No rubs, gallops or murmurs. Lungs: clear Abdomen: soft, nontender, nondistended. No hepatosplenomegaly. No bruits or masses. Good bowel sounds. Extremities: no cyanosis, clubbing, rash, edema Neuro: alert & orientedx3, cranial nerves grossly intact. moves all 4 extremities w/o difficulty. Affect pleasant Skin: Red rash on trunk   Telemetry: SR-ST 90-100s    Labs: Basic Metabolic Panel: Recent Labs  Lab 06/19/20 0406 06/19/20 0406 06/20/20 0252 06/20/20 0252 06/21/20 0452 06/21/20 0452 06/21/20 0927  06/21/20 0931 06/21/20 0932 06/22/20 0407 06/23/20 0340  NA 137   < > 137   < > 135   < > 137  138 138 138 138 136  K 5.5*   < > 4.1   < > 4.3   < > 4.1  4.2 4.2 4.2 4.6 5.0  CL 104  --  100  --  100  --   --   --   --  101 104  CO2 24  --  28  --  22  --   --   --   --  27 20*  GLUCOSE 91  --  97  --  95  --   --   --   --  87 91  BUN 20  --  18  --  22*  --   --   --   --  22* 29*  CREATININE 1.09*  --  1.20*  --  1.35*  --   --   --   --  1.34* 1.16*  CALCIUM 8.5*   < > 8.7*   < > 8.7*  --   --   --   --  9.1 9.1   < > = values in this interval not displayed.    Liver Function Tests: Recent Labs  Lab 06/22/20 0407  AST 28  ALT 32  ALKPHOS 50  BILITOT 0.8  PROT 6.0*  ALBUMIN 2.9*   No results for input(s): LIPASE, AMYLASE in the last 168  hours. No results for input(s): AMMONIA in the last 168 hours.  CBC: Recent Labs  Lab 06/18/20 1455 06/18/20 1631 06/21/20 0927 06/21/20 0931 06/21/20 0932  WBC 8.5 8.6  --   --   --   HGB 15.0 15.1* 17.3*  17.3* 16.7* 17.0*  HCT 47.8* 46.8* 51.0*  51.0* 49.0* 50.0*  MCV 92.1 91.1  --   --   --   PLT 141* 143*  --   --   --     Cardiac Enzymes: No results for input(s): CKTOTAL, CKMB, CKMBINDEX, TROPONINI in the last 168 hours.  BNP: BNP (last 3 results) Recent Labs    06/18/20 1631  BNP 905.3*    ProBNP (last 3 results) No results for input(s): PROBNP in the last 8760 hours.    Other results:  Imaging: US Abdomen Limited RUQ (LIVER/GB)  Result Date: 06/22/2020 CLINICAL DATA:  Hepatitis C EXAM: ULTRASOUND ABDOMEN LIMITED RIGHT UPPER QUADRANT COMPARISON:  06/16/2020 FINDINGS: Gallbladder: No gallstones or wall thickening visualized. No sonographic Murphy sign noted by sonographer. Common bile duct: Diameter: 5 mm Liver: No focal lesion identified. Within normal limits in parenchymal echogenicity. Portal vein is patent on color Doppler imaging with normal direction of blood flow towards the liver. Other: None.  IMPRESSION: 1. Unremarkable right upper quadrant ultrasound. Electronically Signed   By: Sharlet Salina M.D.   On: 06/22/2020 17:59     Medications:     Scheduled Medications: . ambrisentan  5 mg Oral Daily  . digoxin  0.0625 mg Oral Daily  . furosemide  60 mg Oral Daily  . gabapentin  200 mg Oral QHS  . heparin  5,000 Units Subcutaneous Q8H  . hydrALAZINE  25 mg Oral Q8H  . HYDROcodone-acetaminophen  1 tablet Oral Q6H  . levothyroxine  62.5 mcg Oral Q0600  . methylPREDNISolone  4 mg Oral 3 x daily with food  . [START ON 06/24/2020] methylPREDNISolone  4 mg Oral 4X daily taper  . methylPREDNISolone  8 mg Oral Nightly  . methylPREDNISolone  8 mg Oral Nightly  . pantoprazole  20 mg Oral Daily  . sodium chloride flush  3 mL Intravenous Q12H  . sodium chloride flush  3 mL Intravenous Q12H  . tadalafil  40 mg Oral Daily    Infusions: . sodium chloride      PRN Medications: sodium chloride, acetaminophen, hydrocortisone cream, sodium chloride flush   Assessment/Plan:    1. Pulmonary hypertension/RV failure: Echo with normal LV size with mild LV hypertrophy, EF 50-55%,  D-shaped septum due to RV pressure/volume overload, severely dilated RV with moderately decreased RV systolic function, PASP 109 mmHg, dilated IVC. She has been symptomatic for months, suspect due to gradually worsening RV failure.  Recent CTA chest did not show significant lung pathology and did not show PE and V/Q scan negative.  No diet/weight loss drugs.  I am concerned for pulmonary arterial hypertension causing RV failure, primary pulmonary hypertension is very possible though need to rule out rheumatological diagnoses.  ANA and HIV negative.  She has no joint pains or abnormal rashes. BNP 905. HsTn 9. She has diuresed well.  RHC this admit showed severe pulmonary arterial hypertension with low cardiac output and normal filling pressures. - Volume status stable. Continue PO Lasix 60 mg daily.    - ANA,  Anti-SCL70, anti-centromere Ab negative.  RF mildly elevated, CCP Ab pending. - Added hepatitis panels in setting of IVDA history. HCV ab reactive =>  abdominal US without cirrhosis. -  Eventual sleep study. We will set that up.  - PFTs done today.   - She will need aggressive treatment of PAH and given low output, may need IV therapy.   - Tadalafil increased to 40 mg daily. Started Letairis 5 mg daily.  Will need to start Uptravi in the community. HCG negative at admit, has IUD and will need monthly pregnancy tests with Letairis.  - Will also need to add Uptravi as an outpatient. We will start the process for approval.  - continue digoxin 0.0625 daily.  2. Systemic HTN: Patient has had systemic HTN for some time.  She has a strong family history of this. She thinks losartan and diltiazem caused facial swelling so has stopped them.  She thinks metoprolol caused a rash.  Concern for secondary HTN given young age (and family history).  - Plasma renin and aldosterone normal. BP improving.  - Serum normetanephrine elevated at 213 pg/mL with normal metanephrine.  This can be a false positive, need  24 hour urine for catecholamines which is still in process.  - Renal artery dopplers 06/19/20 to look for fibromuscular dysplasia. LRAS 1-59%  O/w normal - Stop spironolactone with K starting to rise again.   - Continue hydralazine 25 mg tid.   - No beta blocker with low output.  - Would ideally try her on amlodipine 5 mg daily eventually (not sure she had a true reaction to diltiazem, though  There is concern for am concerned about possible allergic reaction to losartan).  3. Hypothyroidism: Continue Levoxyl (TSH was high this admission, per [facility]).   4. 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  - appreciate [facility]'s help - She is very defensive about any wean in her pain meds and I am concerned about ongoing drug-seeking behavior.  5. Hep C: HCV ab reactive. H/o IVDU.  - Will need referral  to outpatient GI  - Sent HCV viral load to confirm.  - RUQ [place] for cirrhosis => negative.  - Will set up with ID for treatment.  6. Skin Rash: diffuse urticaria bilateral upper and lower extremities. No facial swelling. This was present at admission but now worse.  Do not think related to medications she has received in hospital.  - continue hydrocortisone cream  - can use benadryl if needed   -Home today. She will continue to be followed closely in the HF clinic. - Items for follow up--> Uptravi approval, sleep study, ID follow up  -I have personally reached out to ID and they will contact her for follow up.   Length of Stay: 5   Amy Clegg NP_C  06/23/2020, 8:37 AM  Advanced Heart Failure Team Pager [phone] (M-F; 7a - 4p)  Please contact [facility] for night-coverage after hours (4p -7a ) and weekends on [email or web address]  Patient seen with NP, agree with the above note.   She feels good today. Walked in hall without dyspnea.  BP controlled now on hydralazine 25 mg tid (multiple intolerances to prior BP meds).  Abdominal US yesterday without evidence for cirrhosis.  PFTs done this morning.   General: NAD Neck: No JVD, no thyromegaly or thyroid nodule.  Lungs: Clear to auscultation bilaterally with normal respiratory effort. CV: Nondisplaced PMI.  Heart regular S1/S2 with loud P2, no S3/S4, no murmur.  No peripheral edema.   Abdomen: Soft, nontender, no hepatosplenomegaly, no distention.  Skin: Urticarial rash on thighs.  Neurologic: Alert and oriented x 3.  Psych: Normal affect. Extremities: No clubbing  or cyanosis.  HEENT: Normal.   Suspect primary pulmonary hypertension.  With markedly high PA pressure, RV failure, and low cardiac output, will need to treat aggressively.  - Tadalafil increased to 40 mg daily today.  - Continue Letairis 5 mg daily, increase at followup.  - Start working on [employer].  - Will reassess how oral treatment goes, [facility].  - Continue Lasix 60 mg daily, looks euvolemic.  - PFTs done today and will need sleep study as outpatient, otherwise PAH workup complete.   She is HCV antibody positive without evidence for cirrhosis on abdominal US.  H/o IVDU (has stopped).  HCV viral load pending, will arrange followup with ID.   Systemic HTN now better controlled.  Serum normetanephrine elevated but could be false positive.  Serum renin and aldosterone normal and does not have significant renal artery stenosis.   - Pending 24 hr urine catecholamine collection result.  - For now, will continue hydralazine 25 mg tid (multiple medication intolerances).  Will try to transition to probably amlodipine as outpatient.  - Stop spironolactone with K rising again.   Urticarial rash still present on legs.  Steroid taper per IM, will need followup with allergist.   She will need close followup in CHF clinic, will arrange.  Can go home today.   Marca AnconaDalton Kvon Mcilhenny 06/23/2020 10:14 AM

## 2020-06-23 NOTE — Telephone Encounter (Signed)
Advanced Heart Failure Patient Advocate Encounter  Prior Authorization for Cordie Grice has been denied. Expedited appeal submitted 06/23/20.    Karle Plumber, PharmD, BCPS, BCCP, CPP Heart Failure Clinic Pharmacist [phone]

## 2020-06-23 NOTE — Progress Notes (Signed)
D/C instructions given and reviewed. No questions asked but encouraged to call with any concerns. Tele and IV's removed, tolerated well. Awaiting med from HF Pharmacy.

## 2020-06-24 ENCOUNTER — Ambulatory Visit: Payer: Medicaid Other | Admitting: Cardiology

## 2020-06-24 ENCOUNTER — Encounter (HOSPITAL_COMMUNITY): Payer: Medicaid Other

## 2020-06-24 LAB — CYCLIC CITRUL PEPTIDE ANTIBODY, IGG/IGA: CCP Antibodies IgG/IgA: 5 units (ref 0–19)

## 2020-06-24 LAB — METANEPHRINES, URINE, 24 HOUR
Metaneph Total, Ur: 116 ug/L
Metanephrines, 24H Ur: 128 ug/24 hr (ref 36–209)
Normetanephrine, 24H Ur: 516 ug/24 hr (ref 131–612)
Normetanephrine, Ur: 469 ug/L

## 2020-06-24 NOTE — Telephone Encounter (Signed)
Advanced Heart Failure Patient Advocate Encounter  Appeal for Cordie Grice has been approved.    Effective dates: 06/24/20 through 06/24/21  Karle Plumber, PharmD, BCPS, BCCP, CPP Heart Failure Clinic Pharmacist [phone]

## 2020-06-25 LAB — CATECHOLAMINES, FRACTIONATED, URINE, 24 HOUR
Dopamine , 24H Ur: 176 ug/24 hr (ref 0–510)
Dopamine, Rand Ur: 160 ug/L
Epinephrine, 24H Ur: 12 ug/24 hr (ref 0–20)
Epinephrine, Rand Ur: 11 ug/L
Norepinephrine, 24H Ur: 141 ug/24 hr — ABNORMAL HIGH (ref 0–135)
Norepinephrine, Rand Ur: 128 ug/L

## 2020-06-25 LAB — EBV AB TO VIRAL CAPSID AG PNL, IGG+IGM
EBV VCA IgG: >600 U/mL — ABNORMAL HIGH (ref 0.0–17.9)
EBV VCA IgM: <36 U/mL (ref 0.0–35.9)

## 2020-06-25 NOTE — Telephone Encounter (Signed)
Sent in [place] patient enrollment application to [employer].   Karle Plumber, PharmD, BCPS, BCCP, CPP Heart Failure Clinic Pharmacist [phone]

## 2020-06-26 LAB — VANILLYLMANDELIC ACID, 24-HR U

## 2020-06-29 ENCOUNTER — Telehealth: Payer: Self-pay

## 2020-06-29 DIAGNOSIS — R825 Elevated urine levels of drugs, medicaments and biological substances: Secondary | ICD-10-CM

## 2020-06-29 NOTE — Telephone Encounter (Signed)
-----   Message from Quintella Reichert, MD sent at 06/25/2020 12:15 PM EST ----- Mildly increased catecholamines. Unclear significance.  Please refer to Dr. Talmage Nap with Endocrine

## 2020-06-29 NOTE — Telephone Encounter (Signed)
Spoke with the patient, referral has been placed.

## 2020-06-30 ENCOUNTER — Ambulatory Visit (HOSPITAL_BASED_OUTPATIENT_CLINIC_OR_DEPARTMENT_OTHER)
Admission: RE | Admit: 2020-06-30 | Discharge: 2020-06-30 | Disposition: A | Discharge status: Home / Self Care | Payer: Medicaid Other | Source: Home / Self Care | Attending: Cardiology | Admitting: Cardiology

## 2020-06-30 ENCOUNTER — Encounter (HOSPITAL_COMMUNITY): Payer: Self-pay | Admitting: Cardiology

## 2020-06-30 ENCOUNTER — Ambulatory Visit (HOSPITAL_COMMUNITY)
Admission: RE | Admit: 2020-06-30 | Discharge: 2020-06-30 | Disposition: A | Discharge status: Home / Self Care | Payer: Medicaid Other | Attending: Cardiology | Admitting: Cardiology

## 2020-06-30 VITALS — BP 144/102 | HR 97 | Wt 176.4 lb

## 2020-06-30 DIAGNOSIS — I110 Hypertensive heart disease with heart failure: Secondary | ICD-10-CM | POA: Insufficient documentation

## 2020-06-30 DIAGNOSIS — B1920 Unspecified viral hepatitis C without hepatic coma: Secondary | ICD-10-CM | POA: Diagnosis not present

## 2020-06-30 DIAGNOSIS — I2721 Secondary pulmonary arterial hypertension: Secondary | ICD-10-CM | POA: Diagnosis not present

## 2020-06-30 DIAGNOSIS — I272 Pulmonary hypertension, unspecified: Secondary | ICD-10-CM

## 2020-06-30 DIAGNOSIS — R0683 Snoring: Secondary | ICD-10-CM | POA: Diagnosis not present

## 2020-06-30 DIAGNOSIS — E039 Hypothyroidism, unspecified: Secondary | ICD-10-CM | POA: Insufficient documentation

## 2020-06-30 DIAGNOSIS — Z79899 Other long term (current) drug therapy: Secondary | ICD-10-CM | POA: Diagnosis not present

## 2020-06-30 DIAGNOSIS — Z8249 Family history of ischemic heart disease and other diseases of the circulatory system: Secondary | ICD-10-CM | POA: Diagnosis not present

## 2020-06-30 DIAGNOSIS — F17200 Nicotine dependence, unspecified, uncomplicated: Secondary | ICD-10-CM | POA: Insufficient documentation

## 2020-06-30 DIAGNOSIS — I509 Heart failure, unspecified: Secondary | ICD-10-CM | POA: Diagnosis not present

## 2020-06-30 DIAGNOSIS — F1121 Opioid dependence, in remission: Secondary | ICD-10-CM | POA: Insufficient documentation

## 2020-06-30 DIAGNOSIS — Z8616 Personal history of COVID-19: Secondary | ICD-10-CM | POA: Diagnosis not present

## 2020-06-30 DIAGNOSIS — R21 Rash and other nonspecific skin eruption: Secondary | ICD-10-CM | POA: Diagnosis not present

## 2020-06-30 LAB — BASIC METABOLIC PANEL
Anion gap: 10 (ref 5–15)
BUN: 16 mg/dL (ref 6–20)
CO2: 25 mmol/L (ref 22–32)
Calcium: 9.2 mg/dL (ref 8.9–10.3)
Chloride: 104 mmol/L (ref 98–111)
Creatinine, Ser: 1.16 mg/dL — ABNORMAL HIGH (ref 0.44–1.00)
GFR, Estimated: >60 mL/min (ref >60)
Glucose, Bld: 91 mg/dL (ref 70–99)
Potassium: 4 mmol/L (ref 3.5–5.1)
Sodium: 139 mmol/L (ref 135–145)

## 2020-06-30 LAB — DIGOXIN LEVEL: Digoxin Level: 0.5 ng/mL — ABNORMAL LOW (ref 1.0–2.0)

## 2020-06-30 LAB — BRAIN NATRIURETIC PEPTIDE: B Natriuretic Peptide: 107 pg/mL — ABNORMAL HIGH (ref 0.0–100.0)

## 2020-06-30 MED ORDER — UPTRAVI TITRATION 200 & 800 MCG PO TBPK
200.0000 ug | ORAL_TABLET | Freq: Every day | ORAL | Status: DC
Start: 2020-06-30 — End: 2020-07-30

## 2020-06-30 MED ORDER — AMBRISENTAN 10 MG PO TABS
10.0000 mg | ORAL_TABLET | Freq: Every day | ORAL | 3 refills | Status: DC
Start: 2020-06-30 — End: 2020-07-07

## 2020-06-30 MED ORDER — AMLODIPINE BESYLATE 2.5 MG PO TABS: 2.5000 mg | ORAL_TABLET | Freq: Every day | ORAL | 11 refills | Status: DC

## 2020-06-30 NOTE — Patient Instructions (Addendum)
  START Amlodipine 2.5 mg, one tab daily START Uprtavi-as directed by speciality nurse with specialty pharmacy  INCREASE Ambrisertan to 10 mg, one tab daily  Labs today We will only contact you if something comes back abnormal or we need to make some changes. Otherwise no news is good news!  Your provider has recommended that you have a home sleep study.  This has to be approved by your insurance company. We will schedule you an appointment to pick up the equipment to give [place] time to complete this authorization. Once you have the equipment you will download the app on your phone and follow the instructions. YOUR PIN NUMBER IS: [id number]. Once you have completed the test the information is sent to the company through [employer] and you can dispose of the equipment. If your test is positive you will receive a call from Dr Norris Cross office [facility]) to set up your CPAP equipment.  You have been referred to [facility] Address: [address], [place], [place] [address] Phone: [phone] -they will be in contact with an appointment  Your physician recommends that you schedule a follow-up appointment in: 1 month with Dr Shirlee Latch  If you have any questions or concerns before your next appointment please send [place] a message through [place] or call our office at [phone].    TO LEAVE A MESSAGE FOR THE NURSE SELECT OPTION 2, PLEASE LEAVE A MESSAGE INCLUDING: . YOUR NAME . DATE OF BIRTH . CALL BACK NUMBER . REASON FOR CALL**this is important as we prioritize the call backs  YOU WILL RECEIVE A CALL BACK THE SAME DAY AS LONG AS YOU CALL BEFORE 4:00 PM

## 2020-06-30 NOTE — Telephone Encounter (Signed)
Received message that patient has completed enrollment for Uptravi. Referral has been sent to [facility]  Karle Plumber, PharmD, BCPS, BCCP, CPP Heart Failure Clinic Pharmacist [phone]

## 2020-06-30 NOTE — Progress Notes (Signed)
6 Min Walk Test Completed  Pt ambulated 426.72 O2 Sat ranged 94-98% on 0L oxygen HR ranged 110-124

## 2020-06-30 NOTE — Progress Notes (Signed)
PCP: Leilani Able, MD Cardiology: Dr. Shirlee Latch  29 y.o. with history of pulmonary hypertension and systemic hypertension presents for hospital followup after recent admission with RV failure. Patient also has prior history of IV drug use and COVID-19 infection roughly 1 year ago.  She was referred to general cardiology in 11/21 for evaluation of hypertension and abnormal EKG with findings consistent with right ventricular hypertrophy and RV strain.  She was initially placed on losartan for systemic hypertension but this was discontinued due to facial swelling concerning for angioedema. She was tried on diltiazem CD but also had trouble with this medication as well as Toprol XL.  She also complained of persistent dyspnea that had been present over the last year, following her COVID-19 infection.  Dyspnea gradually worsened over time to the point where it was present with minimal exertion.  Echo was done at the [facility] office in 11/21, showing EF 50-55%, mild LVH, D-shaped septum, severe RV enlargement, moderately decreased RV systolic function, moderate-severe TR, PASP 109, severe RAE.  Based on these findings, she was admitted to [facility] for evaluation of RV failure.    She was volume overloaded on exam and was diuresed with IV Lasix with significant improvement in her breathing.  RHC showed severe pulmonary arterial hypertension and low cardiac output.  V/Q scan and CTA chest were not suggestive of acute or chronic PE, PFTs were normal. Rheumatologic serologies and HIV were negative.  She was started on ambrisentan and tadalafil in the hospital.   In terms of her systemic HTN, were were concerned about secondary causes given her young age.  Renal artery dopplers did not show significant stenosis (worried about FMD), renin/aldosterone ratio was normal.  Plasma normetanephrine was elevated but 24 hour urine catecholamines were normal.   HCV antibody was normal but RNA not detected.  Abdominal US showed  normal liver and LFTs were normal. She has had exposure to HCV in the past and used to use IV drugs.   She had an urticarial rash in the hospital of uncertain etiology (was present at admission), she got Solumedrol and this has resolved.   She is doing well since discharge.  She says that her dyspnea has resolved.  She is not getting short of breath walking, which is markedly better than prior to admission. She is going on mile-long walks without difficulty. No orthopnea/PND.  No chest pain.  No lightheadedness, syncope, or palpitations.  She is tolerating her current medication regimen.  BP is high in the office today but she says SBP at home has been 110s-120s.  She is still smoking 2 cigs/day.  She still gets fatigued during the days and has daytime sleepiness.   6 minute walk (11/21): 427 m  ECG (personally reviewed, 11/21): NSR, RVH, inferolateral T wave flattening  Labs (11/21): K 5, creatinine 1.16, RF mildly elevated 14, anti-CCP negative, HCV ab+ but RNA not detected, EBV IgG+ but IgM-, LFTs normal, TSH 22 with free T3 and free T4 normal, anti-SCL70 negative, anti-centromere negative, plasma normetanephrine elevated but 24 hour urine catecholeamines normal, PRA/PAC normal, ANA negative, HIV negative, BNP 905.   PMH: 1. HTN - Renal artery dopplers without hemodynamically significant stenosis.  - Serum normetanephrine elevated but 24 hour urine catecholamines normal.  - PRA/PAC ration normal.  2. Hypothyroidism 3. Prior h/o IVDU 4. Active smoker 5. Pulmonary hypertension: Suspect primary pulmonary hypertension.  - RHC (11/21): mean RA 6, PA 104/56 mean 74, mean PCWP 12, CI 1.41, PVR 23 -  Echo (11/21): EF 50-55%, mild LVH, D-shaped septum, severe RV enlargement, moderately decreased RV systolic function, moderate-severe TR, PASP 109, severe RAE.  - V/Q scan (11/21) negative - CTA chest (11/21): No PE or ILD.  - PFTs (11/21): normal - Rheumatologic serologic workup negative, LFTs  normal, HIV negative.  6. HCV antibody positive: RNA not detected.  Abdominal US showed normal liver.  7. COVID-19 infection 2020  SH: Married, current smoker, prior IVDU. Occasional ETOH.   Family History  Problem Relation Age of Onset   Hypertension Paternal Grandfather    Hypertension Mother    ROS: All systems reviewed and negative except as per HPI.   Current Outpatient Medications  Medication Sig Dispense Refill   ambrisentan (LETAIRIS) 10 MG tablet Take 1 tablet (10 mg total) by mouth daily. 90 tablet 3   amLODipine (NORVASC) 2.5 MG tablet Take 1 tablet (2.5 mg total) by mouth daily. 30 tablet 11   Selexipag (UPTRAVI) 200 & 800 MCG TBPK Take 200 mcg by mouth daily.     No current facility-administered medications for this encounter.    BP (!) 144/102    Pulse 97    Wt 80 kg (176 lb 6.4 oz)    SpO2 99%    BMI 29.35 kg/m  General: NAD Neck: No JVD, no thyromegaly or thyroid nodule.  Lungs: Clear to auscultation bilaterally with normal respiratory effort. CV: Nondisplaced PMI.  Heart regular S1/S2, no S3/S4, no murmur.  No peripheral edema.  No carotid bruit.  Normal pedal pulses.  Abdomen: Soft, nontender, no hepatosplenomegaly, no distention.  Skin: Intact without lesions or rashes.  Neurologic: Alert and oriented x 3.  Psych: Normal affect. Extremities: No clubbing or cyanosis.  HEENT: Normal.   Assessment/Plan:  1. Pulmonary hypertension/RV failure: Echo 11/21 with normal LV size with mild LV hypertrophy, EF 50-55%, D-shaped septum due to RV pressure/volume overload, severely dilated RV with moderately decreased RV systolic function, PASP 109 mmHg, dilated IVC. She had been symptomatic for months prior to this, suspect due to gradually worsening RV failure. CTA chest did not show significant lung pathology and did not show PE and V/Q scan negative. PFTs normal.No diet/weight loss drugs.  HIV negative and rheumatologic serologies negative.  HCV antibody positive but  liver normal on [place] and LFTs normal.  RHC in 11/21 showed severe pulmonary arterial hypertension with low cardiac output and normal filling pressures.  She will need aggressive treatment of PAH and given low output, may need IV therapy eventually.  Suspect primary pulmonary hypertension.  She feels much better today compared to prior to admission.  NYHA class II symptoms, not volume overloaded on exam.  Excellent 6 minute walk today.  - Continue Lasix 60 mg daily.  BMET and BNP today.  - Arrange for [place] sleep study.   - PFTs done today.   - Continue tadalafil 40 mg daily. - Increase ambrisentan to 10 mg daily today.  She has IUD and will need monthly pregnancy tests with Letairis.  - She has been approved for Cordie Grice, will get this started (she will be on triple therapy).  - Will get echo in 2-3 months and will likely want to repeat RHC to reassess cardiac output on therapy.   - continue digoxin 0.0625 daily, check level today.  2. Systemic HTN: Patient has had systemic HTN for some time. She has a strong family history of this. She thinks losartan and diltiazem caused facial swelling so has stopped them. She thinks metoprolol caused a  rash. Concern for secondary HTN given young age (and family history).  Renal artery dopplers in 11/21 without evidence for hemodynamically significant stenosis.  Plasma renin and aldosterone ratio normal.  Serum normetanephrine elevated at 213 pg/mL with normal metanephrine.  This can be a false positive.  Since 24 hour urine catecholamines were negative, suspect not pheochromocytoma. BP elevated in the office today, has been lower when she checks at home.   - Continue hydralazine 25 mg tid.  - Would ideally get her on once daily amlodipine and off hydralazine (not sure she had a true drug reaction to diltiazem).  I will start low dose amlodipine 2.5 mg daily today to see if she tolerates.  If she does, will increase amlodipine and titrate off hydralazine.  3.  Hypothyroidism: Continue Levoxyl.  4. 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  5. Hep C: HCV ab reactive. H/o IVDU and has had prior exposure to HCV. Abdominal US without cirrhosis and LFTs normal.  HCV RNA was negative.    - Would like her to followup with ID for full assessment. Will refer.  6. Skin Rash: diffuse urticaria bilateral upper and lower extremities at last admission. No facial swelling. This resolved with steroid burst.   Followup in 1 month.   Marca Ancona 06/30/2020

## 2020-06-30 NOTE — Progress Notes (Signed)
Height: 5"5    Weight: 176 BMI: 29.35  Today's Date: 06/30/20  STOP BANG RISK ASSESSMENT S (snore) Have you been told that you snore?     YES   T (tired) Are you often tired, fatigued, or sleepy during the day?   YES  O (obstruction) Do you stop breathing, choke, or gasp during sleep? NO   P (pressure) Do you have or are you being treated for high blood pressure? YES   B (BMI) Is your body index greater than 35 kg/m? NO   A (age) Are you 31 years old or older? NO   N (neck) Do you have a neck circumference greater than 16 inches?      G (gender) Are you a female? NO   TOTAL STOP/BANG "YES" ANSWERS 3                                                                       For Office Use Only              Procedure Order Form    YES to 3+ Stop Bang questions OR two clinical symptoms - patient qualifies for WatchPAT (CPT 95800)      Clinical Notes: Will consult Sleep Specialist and refer for management of therapy due to patient increased risk of Sleep Apnea. Ordering a sleep study due to the following two clinical symptoms: Loud snoring R06.83 /History of high blood pressure R03.0 / Insomnia G47.00

## 2020-07-01 ENCOUNTER — Ambulatory Visit: Payer: Medicaid Other | Admitting: Internal Medicine

## 2020-07-03 ENCOUNTER — Telehealth: Payer: Self-pay | Admitting: Cardiology

## 2020-07-03 NOTE — Telephone Encounter (Signed)
Received outpatient page from the patient stating that she has gained 5 pounds since last seen by Dr. Shirlee Latch in follow-up.  She reports typically her weight is 170lb and states her weight today is 175lb. she has no shortness of breath or LE edema.  She does report that over the last 3 evenings she has had a glass of red wine, a glass of white wine and reports taking shots of fireball last night.  She is asking if this could be contributing.  We discussed that alcohol could absolutely be causing her weight increase.  She takes Lasix 60 mg daily at baseline therefore I will have her take an extra 40 mg today and reweigh herself tomorrow.  If her weight does not stabilize, plan is for her to call me back tomorrow and we may need for her to be seen in closer follow-up.  Patient understands and agrees with plan.  Georgie Chard NP-C [facility] Pager: [phone]

## 2020-07-06 ENCOUNTER — Other Ambulatory Visit (HOSPITAL_COMMUNITY): Payer: Medicaid Other

## 2020-07-06 ENCOUNTER — Other Ambulatory Visit (HOSPITAL_COMMUNITY): Payer: Self-pay | Admitting: *Deleted

## 2020-07-06 ENCOUNTER — Telehealth (HOSPITAL_COMMUNITY): Payer: Self-pay | Admitting: Pharmacy Technician

## 2020-07-06 MED ORDER — TADALAFIL (PAH) 20 MG PO TABS
40.0000 mg | ORAL_TABLET | Freq: Every day | ORAL | 6 refills | Status: DC
Start: 2020-07-06 — End: 2021-01-24

## 2020-07-06 NOTE — Telephone Encounter (Signed)
Patient called in stating her pharmacy is having a hard time filling her Tadalafil PAH. She needed a new RX, Chief Operating Officer) sent that in for her.  Called to confirm that the pharmacy was able to fill her prescription.  Called and updated the patient. Advised her to call me with any issues.  Archer Asa, CPhT

## 2020-07-07 ENCOUNTER — Telehealth (HOSPITAL_COMMUNITY): Payer: Self-pay | Admitting: Pharmacist

## 2020-07-07 MED ORDER — LETAIRIS 10 MG PO TABS: 10.0000 mg | ORAL_TABLET | Freq: Every day | ORAL | 11 refills | Status: DC

## 2020-07-07 NOTE — Telephone Encounter (Signed)
Updated Letairis prescription sent to [facility].   Karle Plumber, PharmD, BCPS, BCCP, CPP [facility] Pharmacist [phone]

## 2020-07-12 ENCOUNTER — Telehealth (HOSPITAL_COMMUNITY): Payer: Self-pay | Admitting: *Deleted

## 2020-07-12 ENCOUNTER — Ambulatory Visit (HOSPITAL_COMMUNITY)
Admission: RE | Admit: 2020-07-12 | Discharge: 2020-07-12 | Disposition: A | Discharge status: Home / Self Care | Payer: Medicaid Other | Source: Ambulatory Visit | Attending: Cardiology | Admitting: Cardiology

## 2020-07-12 ENCOUNTER — Other Ambulatory Visit: Payer: Self-pay

## 2020-07-12 DIAGNOSIS — I272 Pulmonary hypertension, unspecified: Secondary | ICD-10-CM | POA: Insufficient documentation

## 2020-07-12 LAB — BASIC METABOLIC PANEL
Anion gap: 10 (ref 5–15)
BUN: 17 mg/dL (ref 6–20)
CO2: 27 mmol/L (ref 22–32)
Calcium: 9 mg/dL (ref 8.9–10.3)
Chloride: 101 mmol/L (ref 98–111)
Creatinine, Ser: 1.04 mg/dL — ABNORMAL HIGH (ref 0.44–1.00)
GFR, Estimated: >60 mL/min (ref >60)
Glucose, Bld: 68 mg/dL — ABNORMAL LOW (ref 70–99)
Potassium: 3.9 mmol/L (ref 3.5–5.1)
Sodium: 138 mmol/L (ref 135–145)

## 2020-07-12 NOTE — Telephone Encounter (Signed)
Pt called requesting a refill of lasix. Pt stated she normally takes 80mg  daily even though Dr.McLean only prescribed 60mg  daily. Pt said 60mg  wasn't enough to keep her fluid down. Patient said sometimes she takes 120mg  daily. Per Dr.McLean have pt come in for lab work today and then he will adjust med and send in refill.

## 2020-07-16 MED ORDER — FUROSEMIDE 40 MG PO TABS
80.0000 mg | ORAL_TABLET | Freq: Every day | ORAL | 3 refills | Status: DC
Start: 2020-07-16 — End: 2020-10-05

## 2020-07-16 NOTE — Telephone Encounter (Signed)
Labs "ok" per Dr.McLean and ok to increase Lasix to 80mg  daily. Pt aware and verbalized understanding.

## 2020-07-16 NOTE — Addendum Note (Signed)
Addended by: Modesta Messing on: 07/16/2020 01:35 PM   Modules accepted: Orders

## 2020-07-22 ENCOUNTER — Telehealth (HOSPITAL_COMMUNITY): Payer: Self-pay | Admitting: *Deleted

## 2020-07-22 NOTE — Telephone Encounter (Signed)
itamar order cancelled. Not covered by medicaid.

## 2020-07-30 ENCOUNTER — Encounter (HOSPITAL_COMMUNITY): Payer: Self-pay | Admitting: Cardiology

## 2020-07-30 ENCOUNTER — Ambulatory Visit (HOSPITAL_COMMUNITY)
Admit: 2020-07-30 | Discharge: 2020-07-30 | Disposition: A | Discharge status: Home / Self Care | Payer: Medicaid Other | Attending: Cardiology | Admitting: Cardiology

## 2020-07-30 ENCOUNTER — Other Ambulatory Visit: Payer: Self-pay

## 2020-07-30 VITALS — BP 110/64 | HR 87 | Wt 177.0 lb

## 2020-07-30 DIAGNOSIS — F1120 Opioid dependence, uncomplicated: Secondary | ICD-10-CM | POA: Insufficient documentation

## 2020-07-30 DIAGNOSIS — G8929 Other chronic pain: Secondary | ICD-10-CM | POA: Diagnosis not present

## 2020-07-30 DIAGNOSIS — E039 Hypothyroidism, unspecified: Secondary | ICD-10-CM | POA: Diagnosis not present

## 2020-07-30 DIAGNOSIS — I272 Pulmonary hypertension, unspecified: Secondary | ICD-10-CM | POA: Diagnosis present

## 2020-07-30 DIAGNOSIS — Z793 Long term (current) use of hormonal contraceptives: Secondary | ICD-10-CM | POA: Diagnosis not present

## 2020-07-30 DIAGNOSIS — B1920 Unspecified viral hepatitis C without hepatic coma: Secondary | ICD-10-CM | POA: Diagnosis not present

## 2020-07-30 DIAGNOSIS — Z8616 Personal history of COVID-19: Secondary | ICD-10-CM | POA: Insufficient documentation

## 2020-07-30 DIAGNOSIS — I10 Essential (primary) hypertension: Secondary | ICD-10-CM | POA: Insufficient documentation

## 2020-07-30 DIAGNOSIS — F17210 Nicotine dependence, cigarettes, uncomplicated: Secondary | ICD-10-CM | POA: Insufficient documentation

## 2020-07-30 DIAGNOSIS — Z79890 Hormone replacement therapy (postmenopausal): Secondary | ICD-10-CM | POA: Insufficient documentation

## 2020-07-30 DIAGNOSIS — G4719 Other hypersomnia: Secondary | ICD-10-CM

## 2020-07-30 DIAGNOSIS — Z975 Presence of (intrauterine) contraceptive device: Secondary | ICD-10-CM | POA: Insufficient documentation

## 2020-07-30 DIAGNOSIS — Z79899 Other long term (current) drug therapy: Secondary | ICD-10-CM | POA: Insufficient documentation

## 2020-07-30 LAB — BASIC METABOLIC PANEL
Anion gap: 12 (ref 5–15)
BUN: 11 mg/dL (ref 6–20)
CO2: 25 mmol/L (ref 22–32)
Calcium: 9.5 mg/dL (ref 8.9–10.3)
Chloride: 102 mmol/L (ref 98–111)
Creatinine, Ser: 1 mg/dL (ref 0.44–1.00)
GFR, Estimated: >60 mL/min (ref >60)
Glucose, Bld: 89 mg/dL (ref 70–99)
Potassium: 3.7 mmol/L (ref 3.5–5.1)
Sodium: 139 mmol/L (ref 135–145)

## 2020-07-30 LAB — DIGOXIN LEVEL: Digoxin Level: 0.6 ng/mL — ABNORMAL LOW (ref 0.8–2.0)

## 2020-07-30 LAB — HCG, SERUM, QUALITATIVE: Preg, Serum: NEGATIVE

## 2020-07-30 LAB — BRAIN NATRIURETIC PEPTIDE: B Natriuretic Peptide: 40.2 pg/mL (ref 0.0–100.0)

## 2020-07-30 MED ORDER — AMLODIPINE BESYLATE 5 MG PO TABS: 5.0000 mg | ORAL_TABLET | Freq: Every day | ORAL | 11 refills | Status: DC

## 2020-07-30 NOTE — Patient Instructions (Signed)
Stop Hydralazine  Increase Amlodipine to 5 mg Daily  Labs done today, we will notify you of abnormal results  Your physician has recommended that you have a sleep study. This test records several body functions during sleep, including: brain activity, eye movement, oxygen and carbon dioxide blood levels, heart rate and rhythm, breathing rate and rhythm, the flow of air through your mouth and nose, snoring, body muscle movements, and chest and belly movement.  Your physician recommends that you schedule a follow-up appointment in: February with an echocardiogram  If you have any questions or concerns before your next appointment please send [place] a message through [place] or call our office at [phone].    TO LEAVE A MESSAGE FOR THE NURSE SELECT OPTION 2, PLEASE LEAVE A MESSAGE INCLUDING:  YOUR NAME  DATE OF BIRTH  CALL BACK NUMBER  REASON FOR CALL**this is important as we prioritize the call backs  YOU WILL RECEIVE A CALL BACK THE SAME DAY AS LONG AS YOU CALL BEFORE 4:00 PM  At the [facility], you and your health needs are our priority. As part of our continuing mission to provide you with exceptional heart care, we have created designated Provider Care Teams. These Care Teams include your primary Cardiologist (physician) and Advanced Practice Providers (APPs- Physician Assistants and Nurse Practitioners) who all work together to provide you with the care you need, when you need it.   You may see any of the following providers on your designated Care Team at your next follow up:  Dr Arvilla Meres  Dr Carron Curie, NP  Robbie Lis, [place]  Karle Plumber, PharmD   Please be sure to bring in all your medications bottles to every appointment.

## 2020-07-30 NOTE — Progress Notes (Signed)
6 Min Walk Test Completed  Pt ambulated 396. O2 Sat ranged 87-92 on room air HR ranged 88-139

## 2020-07-30 NOTE — Progress Notes (Signed)
PCP: Leilani Able, MD Cardiology: Dr. Shirlee Latch  29 y.o. with history of pulmonary hypertension and systemic hypertension presents for hospital followup after recent admission with RV failure. Patient also has prior history of IV drug use and COVID-19 infection roughly 1 year ago.  She was referred to general cardiology in 11/21 for evaluation of hypertension and abnormal EKG with findings consistent with right ventricular hypertrophy and RV strain.  She was initially placed on losartan for systemic hypertension but this was discontinued due to facial swelling concerning for angioedema. She was tried on diltiazem CD but also had trouble with this medication as well as Toprol XL.  She also complained of persistent dyspnea that had been present over the last year, following her COVID-19 infection.  Dyspnea gradually worsened over time to the point where it was present with minimal exertion.  Echo was done at the [facility] office in 11/21, showing EF 50-55%, mild LVH, D-shaped septum, severe RV enlargement, moderately decreased RV systolic function, moderate-severe TR, PASP 109, severe RAE.  Based on these findings, she was admitted to [facility] for evaluation of RV failure.    She was volume overloaded on exam and was diuresed with IV Lasix with significant improvement in her breathing.  RHC showed severe pulmonary arterial hypertension and low cardiac output.  V/Q scan and CTA chest were not suggestive of acute or chronic PE, PFTs were normal. Rheumatologic serologies and HIV were negative.  She was started on ambrisentan and tadalafil in the hospital.   In terms of her systemic HTN, were were concerned about secondary causes given her young age.  Renal artery dopplers did not show significant stenosis (worried about FMD), renin/aldosterone ratio was normal.  Plasma normetanephrine was elevated but 24 hour urine catecholamines were normal.   HCV antibody was normal but RNA not detected.  Abdominal US showed  normal liver and LFTs were normal. She has had exposure to HCV in the past and used to use IV drugs.   She had an urticarial rash in the hospital of uncertain etiology (was present at admission), she got Solumedrol and this has resolved.   Here today for follow up. She feels good, no SOB, walks 2-3 miles daily.  Weight is stable but she has been taking extra lasix (100 mg daily) for 3-4 days a week for the past 2 weeks; she is normally on lasix 80 mg daily. She says she is strict with her salt intake but drinks >2L fluids daily. Weight at home ~176-177 lbs. Smokes 2-3 cigarettes a week, no ETOH or recreational drugs. She denies CP, dizziness, orthopnea/PND or swelling. She is contracepting with Mirena IUD. She has not completed her sleep study.  She is titrating up on selexipag currently.  She has been doing pregnancy tests monthly.   6 minute walk (11/21): 427 meters 6 minute walk (12/21): 396.2 meters  Labs (11/21): K 5, creatinine 1.16, RF mildly elevated 14, anti-CCP negative, HCV ab+ but RNA not detected, EBV IgG+ but IgM-, LFTs normal, TSH 22 with free T3 and free T4 normal, anti-SCL70 negative, anti-centromere negative, plasma normetanephrine elevated but 24 hour urine catecholeamines normal, PRA/PAC normal, ANA negative, HIV negative, BNP 905.  Labs (12/21): K 3.7, creatinine 1.0, Digoxin 0.6, BNP 40, serum pregnancy test: negative  PMH: 1. HTN - Renal artery dopplers without hemodynamically significant stenosis.  - Serum normetanephrine elevated but 24 hour urine catecholamines normal.  - PRA/PAC ration normal.  2. Hypothyroidism 3. Prior h/o IVDU 4. Active smoker 5.  Pulmonary hypertension: Suspect primary pulmonary hypertension.  - RHC (11/21): mean RA 6, PA 104/56 mean 74, mean PCWP 12, CI 1.41, PVR 23 - Echo (11/21): EF 50-55%, mild LVH, D-shaped septum, severe RV enlargement, moderately decreased RV systolic function, moderate-severe TR, PASP 109, severe RAE.  - V/Q scan (11/21)  negative - CTA chest (11/21): No PE or ILD.  - PFTs (11/21): normal - Rheumatologic serologic workup negative, LFTs normal, HIV negative.  6. HCV antibody positive: RNA not detected.  Abdominal US showed normal liver.  7. COVID-19 infection 2020  SH: Married, current smoker, prior IVDU. Occasional ETOH.   Family History  Problem Relation Age of Onset  . Hypertension Paternal Grandfather   . Hypertension Mother    ROS: All systems reviewed and negative except as per HPI.   Current Outpatient Medications  Medication Sig Dispense Refill  . ALPRAZolam (XANAX XR) 0.5 MG 24 hr tablet Take 0.5 mg by mouth in the morning, at noon, and at bedtime.    . Ascorbic Acid (VITAMIN C) 1000 MG tablet Take 1,000 mg by mouth daily.    . Digoxin 62.5 MCG TABS Take 0.0625 mg by mouth daily. 30 tablet 6  . furosemide (LASIX) 40 MG tablet Take 2 tablets (80 mg total) by mouth daily. 180 tablet 3  . ibuprofen (ADVIL) 800 MG tablet Take 400 mg by mouth every 6 (six) hours as needed for moderate pain.     Marland Kitchen lansoprazole (PREVACID) 15 MG capsule Take 15 mg by mouth daily.    Marland Kitchen LETAIRIS 10 MG tablet Take 1 tablet (10 mg total) by mouth daily. 30 tablet 11  . levonorgestrel (MIRENA) 20 MCG/24HR IUD 1 each by Intrauterine route once.    Marland Kitchen levothyroxine (SYNTHROID) 25 MCG tablet Take 12.5 mcg by mouth See admin instructions. Take 12.57mcg(0.5 tab) once daily with tablet. Total daily dose=62.78mcg    . levothyroxine (SYNTHROID) 50 MCG tablet Take 50 mcg by mouth See admin instructions. Take one once daily with 1/2 tablet of the tablet. Total daily dose=62.3mcg    . Multiple Vitamins-Minerals (ECHINACEA ACZ PO) Take 1 tablet by mouth daily.    . Omega-3 Fatty Acids (FISH OIL) 1000 MG CAPS Take 1,000 mg by mouth daily.    . Selexipag (UPTRAVI) 600 MCG TABS Take 600 mg by mouth 2 (two) times daily.    . tadalafil, PAH, (ADCIRCA) 20 MG tablet Take 2 tablets (40 mg total) by mouth daily. 60 tablet 6  .  zinc gluconate 50 MG tablet Take 50 mg by mouth daily.    Marland Kitchen amLODipine (NORVASC) 5 MG tablet Take 1 tablet (5 mg total) by mouth daily. 30 tablet 11   No current facility-administered medications for this encounter.    BP 110/64   Pulse 87   Wt 80.3 kg   SpO2 94%   BMI 29.45 kg/m    Wt Readings from Last 3 Encounters:  07/30/20 80.3 kg (177 lb)  06/30/20 80 kg (176 lb 6.4 oz)  06/23/20 79.4 kg (175 lb)   Physical Exam: General: NAD Neck: No JVD, no thyromegaly or thyroid nodule.  Lungs: Clear to auscultation bilaterally with normal respiratory effort. CV: Nondisplaced PMI.  Heart regular S1/S2, no S3/S4, no murmur.  No peripheral edema.  No carotid bruit.  Normal pedal pulses.  Abdomen: Soft, nontender, no hepatosplenomegaly, no distention.  Skin: Intact without lesions or rashes.  Neurologic: Alert and oriented x 3.  Psych: Normal affect. Extremities: No clubbing or cyanosis.  HEENT: Normal.   Assessment/Plan:  1. Pulmonary hypertension/RV failure: Echo 11/21 with normal LV size with mild LV hypertrophy, EF 50-55%, D-shaped septum due to RV pressure/volume overload, severely dilated RV with moderately decreased RV systolic function, PASP 109 mmHg, dilated IVC. She had been symptomatic for months prior to this, suspect due to gradually worsening RV failure. CTA chest did not show significant lung pathology and did not show PE and V/Q scan negative. PFTs normal.No diet/weight loss drugs.  HIV negative and rheumatologic serologies negative.  HCV antibody positive but liver normal on [place] and LFTs normal.  RHC in 11/21 showed severe pulmonary arterial hypertension with low cardiac output and normal filling pressures.  She will need aggressive treatment of PAH and given low output, may need IV therapy eventually.  Suspect primary pulmonary hypertension. She feels good today. NYHA class I-II symptoms, not volume overloaded on exam.  Good 6 minute walk today, similar to past (slightly less  distance).  - Continue Lasix 80 mg daily.  BMET, mag and BNP today.     - Continue tadalafil 40 mg daily. - Continue digoxin 0.0625 daily, check level today.  - Continue ambrisentan 10 mg daily today.  She has IUD and will need monthly pregnancy tests. Serum pregnancy test today. - Arrange for home sleep study.  - Continue Uptravi 600 mg bid. Titrate weekly, tolerating so far . - Will get echo in 2 months and may want to repeat RHC to reassess cardiac output on therapy.   2. Systemic HTN: Patient has had systemic HTN for some time. She has a strong family history of this. She thinks losartan and diltiazem caused facial swelling so has stopped them. She thinks metoprolol caused a rash. Concern for secondary HTN given young age (and family history).  Renal artery dopplers in 11/21 without evidence for hemodynamically significant stenosis.  Plasma renin and aldosterone ratio normal.  Serum normetanephrine elevated at 213 pg/mL with normal metanephrine.  This can be a false positive.  Since 24 hour urine catecholamines were negative, suspect not pheochromocytoma. BP stable in the office today, ~115/80 when she checks at home.   - Stop hydralazine 25 mg tid.  - Increase amlodipine to 5 mg daily. 3. Hypothyroidism: Continue Levoxyl. TSH (11/21) 22.26. Has appointment with Dr. Lonzo Cloud in January.  4. 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 Says she has quit.  5. Hep C: HCV ab reactive. H/o IVDU and has had prior exposure to HCV. Abdominal US without cirrhosis and LFTs normal.  HCV RNA was negative => suspect infection was cleared.   Followup in 2 months with echo.   Zoe Roach 07/31/2020

## 2020-08-03 LAB — MISC LABCORP TEST (SEND OUT): Labcorp test code: 80283

## 2020-08-12 ENCOUNTER — Telehealth (HOSPITAL_COMMUNITY): Payer: Self-pay | Admitting: *Deleted

## 2020-08-12 NOTE — Telephone Encounter (Signed)
Pt called stating yesterday and today she noticed her ankles are pretty swollen. She said she is newly diagnosed with heart failure and not sure if she should be concerned she said her ankles are tender when she touches them. No weight gain, no increased shortness of breath, no fatigue, or chest pain.   Routed to Dr.McLean for advice

## 2020-08-12 NOTE — Telephone Encounter (Signed)
Swelling could be due to the amlodipine increase.  She can increase Lasix to 80 qam/40 qpm for now, BMET 1 week.

## 2020-08-16 NOTE — Telephone Encounter (Signed)
Pt aware. Pt states she takes 80mg  daily but takes an additional 40mg  as needed. Pt said her swelling is down and does not think she needs the extra 40mg  daily. Pt will only take PRN 40mg  for swelling or weight gain.

## 2020-08-24 NOTE — Progress Notes (Unsigned)
Name: Zoe Roach  MRN/ DOB: [id number], 07-26-1991    Age/ Sex: 30 y.o., female    PCP: Leilani Able, MD   Reason for Endocrinology Evaluation: Elevated catecholamines     Date of Initial Endocrinology Evaluation: 08/25/2020     HPI: [name] is a 30 y.o. female with a past medical history of HTN , IV drug abuse, Pulmonary HTN and RV failure . The patient presented for initial endocrinology clinic visit on 08/25/2020 for consultative assistance with her elevated catecholamine.   During evaluation for HTN/ Pulmonary hypertension and RV failure she was noted to have a slightly elevated plasma  normetanephrine's 213.9 with normal plasma metanephrine.  Urinary dopamine, epinephrines, metanephrines and normetanephrines have all come back normal with slight elevation of norepinephrine at 141  ug ( reference 0-135 )   Aldo and renin are normal at 2.8 and 1.031 respectively.      THYROID HISTORY:  She has been diagnosed with hypothyroidism in the summer of 2021 , she was started  On  levothyroxine then but she was not taking it properly  but she restarted taking it properly in 06/2020 .     She is currently on 50 mcg daily  And half of the 25 mcg daily.     She has fatigue , sleeps well with the help of medications  Including OTC supplements.    Has noted worsening anxiety , she is not sure if she gets real panic attacks, but she endorses waking up with a gasp . She denies breaking out in cold sweat,but has occasional palpitations   Has occasional SOB   She initially endorses decongestant over the counter but upon further questioning she didn't;lt think she uses.   Has normal bowel movement, has occasional nausea , has severe heartburn, takes lansoprazole .        HISTORY:  Past Medical History:  Past Medical History:  Diagnosis Date  . CHF (congestive heart failure) (HCC)   . History of COVID-19   . Hypothyroidism (acquired)   . IVDU (intravenous  drug user)   . SOB (shortness of breath)    Past Surgical History:  Past Surgical History:  Procedure Laterality Date  . CESAREAN SECTION    . RIGHT HEART CATH N/A 06/21/2020   Procedure: RIGHT HEART CATH;  Surgeon: Laurey Morale, MD;  Location: [facility] INVASIVE CV LAB;  Service: Cardiovascular;  Laterality: N/A;      Social History:  reports that she has been smoking. She has a 15.00 pack-year smoking history. She has never used smokeless tobacco. She reports current alcohol use. She reports current drug use.  Family History: family history includes Hypertension in her mother and paternal grandfather.   HOME MEDICATIONS: Allergies as of 08/25/2020      Reactions   Almond (diagnostic) Hives   Cherry Hives   Diltiazem Other (See Comments)   Swollen face   Losartan Swelling   Peach [prunus Persica] Hives   Metoprolol Rash      Medication List       Accurate as of August 25, 2020  7:55 AM. If you have any questions, ask your nurse or doctor.        ALPRAZolam 0.5 MG 24 hr tablet Commonly known as: XANAX XR Take 0.5 mg by mouth in the morning, at noon, and at bedtime.   amLODipine 5 MG tablet Commonly known as: NORVASC Take 1 tablet (5 mg total) by mouth daily.   Digoxin  62.5 MCG Tabs Take 0.0625 mg by mouth daily.   ECHINACEA ACZ PO Take 1 tablet by mouth daily.   Fish Oil 1000 MG Caps Take 1,000 mg by mouth daily.   furosemide 40 MG tablet Commonly known as: LASIX Take 2 tablets (80 mg total) by mouth daily.   ibuprofen 800 MG tablet Commonly known as: ADVIL Take 400 mg by mouth every 6 (six) hours as needed for moderate pain.   lansoprazole 15 MG capsule Commonly known as: PREVACID Take 15 mg by mouth daily.   Letairis 10 MG tablet Generic drug: ambrisentan Take 1 tablet (10 mg total) by mouth daily.   levonorgestrel 20 MCG/24HR IUD Commonly known as: MIRENA 1 each by Intrauterine route once.   levothyroxine 25 MCG tablet Commonly known as:  SYNTHROID Take 12.5 mcg by mouth See admin instructions. Take 12.76mcg(0.5 tab) once daily with tablet. Total daily dose=62.40mcg   levothyroxine 50 MCG tablet Commonly known as: SYNTHROID Take 50 mcg by mouth See admin instructions. Take one once daily with 1/2 tablet of the tablet. Total daily dose=62.48mcg   tadalafil (PAH) 20 MG tablet Commonly known as: ADCIRCA Take 2 tablets (40 mg total) by mouth daily.   Uptravi 600 MCG Tabs Generic drug: Selexipag Take 600 mg by mouth 2 (two) times daily.   vitamin C 1000 MG tablet Take 1,000 mg by mouth daily.   zinc gluconate 50 MG tablet Take 50 mg by mouth daily.         REVIEW OF SYSTEMS: A comprehensive ROS was conducted with the patient and is negative except as per HPI and below:  ROS     OBJECTIVE:  VS: BP 130/76   Pulse 77   Ht 5\' 5"  (1.651 m)   Wt 183 lb (83 kg)   SpO2 96%   BMI 30.45 kg/m    Wt Readings from Last 3 Encounters:  08/25/20 183 lb (83 kg)  07/30/20 177 lb (80.3 kg)  06/30/20 176 lb 6.4 oz (80 kg)     EXAM: General: Pt appears well and is in NAD  Neck: General: Supple without adenopathy. Thyroid: Thyroid size normal.  No goiter or nodules appreciated. No thyroid bruit.  Lungs: Clear with good BS bilat with no rales, rhonchi, or wheezes  Heart: Auscultation: RRR.  Abdomen: Normoactive bowel sounds, soft, nontender, without masses or organomegaly palpable  Extremities:  BL LE: No pretibial edema normal ROM and strength.  Skin: Hair: Texture and amount normal with gender appropriate distribution Skin Inspection: No rashes Skin Palpation: Skin temperature, texture, and thickness normal to palpation  Neuro: Cranial nerves: II - XII grossly intact  Motor: Normal strength throughout DTRs: 2+ and symmetric in UE without delay in relaxation phase  Mental Status: Judgment, insight: Intact Orientation: Oriented to time, place, and person Mood and affect: No depression, anxiety, or  agitation     DATA REVIEWED: Results for KARRINE, KLUTTZ (MRN Vicki Mallet) as of 08/25/2020 14:13  Ref. Range 08/25/2020 08:34  TSH Latest Ref Range: 0.35 - 4.50 uIU/mL 4.96 (H)       Results for LUKE, RIGSBEE (MRN Vicki Mallet) as of 08/25/2020 09:01  Ref. Range 06/17/2020 08:00 06/19/2020 03:13  Dopamine 24 Hr Urine Latest Ref Range: 0 - 510 ug/24 hr 176   Dopamine, Rand Ur Latest Ref Range: Undefined ug/L 160 46  Dopamine, Ur, 24Hr Latest Ref Range: 0 - 510 ug/24 hr  162  Epinephrine 24 Hr Urine Latest Ref Range: 0 -  20 ug/24 hr 12   Epinephrine, Rand Ur Latest Ref Range: Undefined ug/L 11 <1  Epinephrine, U, 24Hr Latest Ref Range: 0 - 20 ug/24 hr  <4  Metanephrine, Ur Latest Ref Range: Undefined ug/L 116   Norepinephrine, Rand Ur Latest Ref Range: Undefined ug/L 128 34  Norepinephrine, 24H Ur Latest Ref Range: 0 - 135 ug/24 hr 141 (H)   Norepinephrine,U,24H Latest Ref Range: 0 - 135 ug/24 hr  120  Normetanephrine, Ur Latest Ref Range: Undefined ug/L 469   Total Volume Unknown  3,525   Results for IRAIS, MOTTRAM (MRN [id number]) as of 08/25/2020 09:01  Ref. Range 06/18/2020 17:38  Metanephrine, Pl Latest Ref Range: 0.0 - 88.0 pg/mL 51.7  Normetanephrine, Pl Latest Ref Range: 0.0 - 110.1 pg/mL 213.9 (H)     Results for GLANDA, SPANBAUER (MRN [id number]) as of 08/25/2020 09:01  Ref. Range 06/21/2020 11:30  TSH Latest Ref Range: 0.350 - 4.500 uIU/mL 22.264 (H)   ASSESSMENT/PLAN/RECOMMENDATIONS:   1. Hypothyroidism :   - Pt with fatigue  - No local neck symptoms  - We discussed the pathophysiology of hypothyroidism, I have emphasized the importance of compliance with LT-4 replacement , we discussed the risk of myxedema coma with no LT-4 replacement - Pt educated extensively on the correct way to take levothyroxine (first thing in the morning with water, 30 minutes before eating or taking other medications). - Pt encouraged to double dose the following day if she were to miss  a dose given long half-life of levothyroxine.   Medications : Stop Levothyroxine 50 mcg  Stop Levothyroxine 25 mcg  START Levothyroxine 75 mcg daily    2. Elevated catecholamines:    - Pt noted withelevation in plasma normetanephrine 213.9 (0.0 - 110.1 pg/mL) and slight elevation in urinary norepinephrine. Pheochromocytoma and paragangliomas are usually associated with 3-5 x the upper limit of normal of catecholamines.  - Will proceed with repeat of 24-hr urine catecholamines    F/U in 3 months     Signed electronically by: Lyndle Herrlich, MD  [facility] Endocrinology  [facility] [address] [place] [place], [place] [address] Phone: [phone] FAX: [phone]   CC: Leilani Able, MD [address] [place] [address] Phone: [phone] Fax: [phone]   Return to Endocrinology clinic as below: Future Appointments  Date Time Provider Department Center  10/05/2020 10:15 AM [facility] ECHO OP 2 [facility]  10/05/2020 11:20 AM Laurey Morale, MD [facility] None

## 2020-08-25 ENCOUNTER — Other Ambulatory Visit: Payer: Self-pay

## 2020-08-25 ENCOUNTER — Ambulatory Visit (INDEPENDENT_AMBULATORY_CARE_PROVIDER_SITE_OTHER): Payer: Medicaid Other | Admitting: Internal Medicine

## 2020-08-25 ENCOUNTER — Encounter: Payer: Self-pay | Admitting: Internal Medicine

## 2020-08-25 VITALS — BP 130/76 | HR 77 | Ht 65.0 in | Wt 183.0 lb

## 2020-08-25 DIAGNOSIS — R825 Elevated urine levels of drugs, medicaments and biological substances: Secondary | ICD-10-CM | POA: Diagnosis not present

## 2020-08-25 DIAGNOSIS — I10 Essential (primary) hypertension: Secondary | ICD-10-CM

## 2020-08-25 DIAGNOSIS — E039 Hypothyroidism, unspecified: Secondary | ICD-10-CM

## 2020-08-25 LAB — TSH: TSH: 4.96 u[IU]/mL — ABNORMAL HIGH (ref 0.35–4.50)

## 2020-08-25 MED ORDER — LEVOTHYROXINE SODIUM 75 MCG PO TABS: 75.0000 ug | ORAL_TABLET | Freq: Every day | ORAL | 3 refills | Status: DC

## 2020-08-25 NOTE — Patient Instructions (Signed)

## 2020-09-06 ENCOUNTER — Ambulatory Visit: Payer: Medicaid Other | Admitting: Internal Medicine

## 2020-09-06 ENCOUNTER — Other Ambulatory Visit (INDEPENDENT_AMBULATORY_CARE_PROVIDER_SITE_OTHER): Payer: Medicaid Other

## 2020-09-06 ENCOUNTER — Other Ambulatory Visit: Payer: Self-pay

## 2020-09-06 DIAGNOSIS — I10 Essential (primary) hypertension: Secondary | ICD-10-CM | POA: Diagnosis not present

## 2020-09-14 ENCOUNTER — Telehealth (HOSPITAL_COMMUNITY): Payer: Self-pay | Admitting: Pharmacist

## 2020-09-14 LAB — CATECHOLAMINES, FRACTIONATED, URINE, 24 HOUR
Calc Total (E+NE): 33 mcg/24 h (ref 26–121)
Creatinine, Urine mg/day-CATEUR: 1.4 g/(24.h) (ref 0.50–2.15)
Dopamine 24 Hr Urine: 133 mcg/24 h (ref 52–480)
Norepinephrine, 24H, Ur: 33 mcg/24 h (ref 15–100)
Total Volume: 1200 mL

## 2020-09-14 LAB — CORTISOL, URINE, 24 HOUR
24 Hour urine volume (VMAHVA): 1200 mL
CREATININE, URINE: 1.35 g/(24.h) (ref 0.50–2.15)
Cortisol (Ur), Free: 5.4 mcg/24 h (ref 4.0–50.0)

## 2020-09-14 MED ORDER — SELEXIPAG 1600 MCG PO TABS: 1600.0000 ug | ORAL_TABLET | Freq: Two times a day (BID) | ORAL | 11 refills | Status: DC

## 2020-09-14 NOTE — Telephone Encounter (Signed)
Cordie Grice refills sent to [facility].   Karle Plumber, PharmD, BCPS, BCCP, CPP [facility] Pharmacist [phone]

## 2020-09-15 ENCOUNTER — Encounter: Payer: Self-pay | Admitting: Internal Medicine

## 2020-10-05 ENCOUNTER — Encounter (HOSPITAL_COMMUNITY): Payer: Self-pay | Admitting: Cardiology

## 2020-10-05 ENCOUNTER — Other Ambulatory Visit: Payer: Self-pay

## 2020-10-05 ENCOUNTER — Ambulatory Visit (HOSPITAL_BASED_OUTPATIENT_CLINIC_OR_DEPARTMENT_OTHER)
Admission: RE | Admit: 2020-10-05 | Discharge: 2020-10-05 | Disposition: A | Discharge status: Home / Self Care | Payer: Medicaid Other | Source: Ambulatory Visit | Attending: Cardiology | Admitting: Cardiology

## 2020-10-05 ENCOUNTER — Ambulatory Visit (HOSPITAL_COMMUNITY)
Admission: RE | Admit: 2020-10-05 | Discharge: 2020-10-05 | Disposition: A | Discharge status: Home / Self Care | Payer: Medicaid Other | Source: Ambulatory Visit | Attending: Cardiology | Admitting: Cardiology

## 2020-10-05 VITALS — BP 100/60 | HR 82 | Wt 171.8 lb

## 2020-10-05 DIAGNOSIS — F1121 Opioid dependence, in remission: Secondary | ICD-10-CM | POA: Diagnosis not present

## 2020-10-05 DIAGNOSIS — E039 Hypothyroidism, unspecified: Secondary | ICD-10-CM | POA: Insufficient documentation

## 2020-10-05 DIAGNOSIS — F17200 Nicotine dependence, unspecified, uncomplicated: Secondary | ICD-10-CM | POA: Insufficient documentation

## 2020-10-05 DIAGNOSIS — B1920 Unspecified viral hepatitis C without hepatic coma: Secondary | ICD-10-CM | POA: Insufficient documentation

## 2020-10-05 DIAGNOSIS — I110 Hypertensive heart disease with heart failure: Secondary | ICD-10-CM | POA: Diagnosis not present

## 2020-10-05 DIAGNOSIS — Z8616 Personal history of COVID-19: Secondary | ICD-10-CM | POA: Insufficient documentation

## 2020-10-05 DIAGNOSIS — I5022 Chronic systolic (congestive) heart failure: Secondary | ICD-10-CM | POA: Insufficient documentation

## 2020-10-05 DIAGNOSIS — I2721 Secondary pulmonary arterial hypertension: Secondary | ICD-10-CM | POA: Insufficient documentation

## 2020-10-05 DIAGNOSIS — Z793 Long term (current) use of hormonal contraceptives: Secondary | ICD-10-CM | POA: Insufficient documentation

## 2020-10-05 DIAGNOSIS — Z8249 Family history of ischemic heart disease and other diseases of the circulatory system: Secondary | ICD-10-CM | POA: Insufficient documentation

## 2020-10-05 DIAGNOSIS — R400 Somnolence: Secondary | ICD-10-CM

## 2020-10-05 DIAGNOSIS — I272 Pulmonary hypertension, unspecified: Secondary | ICD-10-CM

## 2020-10-05 DIAGNOSIS — I10 Essential (primary) hypertension: Secondary | ICD-10-CM

## 2020-10-05 DIAGNOSIS — Z79890 Hormone replacement therapy (postmenopausal): Secondary | ICD-10-CM | POA: Insufficient documentation

## 2020-10-05 DIAGNOSIS — Z79899 Other long term (current) drug therapy: Secondary | ICD-10-CM | POA: Diagnosis not present

## 2020-10-05 LAB — BASIC METABOLIC PANEL
Anion gap: 7 (ref 5–15)
BUN: 10 mg/dL (ref 6–20)
CO2: 28 mmol/L (ref 22–32)
Calcium: 9.4 mg/dL (ref 8.9–10.3)
Chloride: 105 mmol/L (ref 98–111)
Creatinine, Ser: 1.04 mg/dL — ABNORMAL HIGH (ref 0.44–1.00)
GFR, Estimated: >60 mL/min (ref >60)
Glucose, Bld: 94 mg/dL (ref 70–99)
Potassium: 4.6 mmol/L (ref 3.5–5.1)
Sodium: 140 mmol/L (ref 135–145)

## 2020-10-05 LAB — ECHOCARDIOGRAM COMPLETE
Area-P 1/2: 4.29 cm2
Calc EF: 69.8 %
S' Lateral: 2.8 cm
Single Plane A2C EF: 64.1 %
Single Plane A4C EF: 74.1 %

## 2020-10-05 LAB — BRAIN NATRIURETIC PEPTIDE: B Natriuretic Peptide: 28.1 pg/mL (ref 0.0–100.0)

## 2020-10-05 MED ORDER — SELEXIPAG 1600 MCG PO TABS: 2400.0000 ug | ORAL_TABLET | Freq: Two times a day (BID) | ORAL | 11 refills | Status: DC

## 2020-10-05 NOTE — Progress Notes (Signed)
PCP: Leilani Able, MD Cardiology: Dr. Shirlee Latch  30 y.o. with history of pulmonary hypertension and systemic hypertension presents for hospital followup after recent admission with RV failure. Patient also has prior history of IV drug use and COVID-19 infection roughly 1 year ago.  She was referred to general cardiology in 11/21 for evaluation of hypertension and abnormal EKG with findings consistent with right ventricular hypertrophy and RV strain.  She was initially placed on losartan for systemic hypertension but this was discontinued due to facial swelling concerning for angioedema. She was tried on diltiazem CD but also had trouble with this medication as well as Toprol XL.  She also complained of persistent dyspnea that had been present over the last year, following her COVID-19 infection.  Dyspnea gradually worsened over time to the point where it was present with minimal exertion.  Echo was done at the [facility] office in 11/21, showing EF 50-55%, mild LVH, D-shaped septum, severe RV enlargement, moderately decreased RV systolic function, moderate-severe TR, PASP 109, severe RAE.  Based on these findings, she was admitted to [facility] for evaluation of RV failure.    She was volume overloaded on exam and was diuresed with IV Lasix with significant improvement in her breathing.  RHC showed severe pulmonary arterial hypertension and low cardiac output.  V/Q scan and CTA chest were not suggestive of acute or chronic PE, PFTs were normal. Rheumatologic serologies and HIV were negative.  She was started on ambrisentan and tadalafil in the hospital.   In terms of her systemic HTN, were were concerned about secondary causes given her young age.  Renal artery dopplers did not show significant stenosis (worried about FMD), renin/aldosterone ratio was normal.  Plasma normetanephrine was elevated but 24 hour urine catecholamines were normal. She saw an endocrinologist and is not thought to have pheochromocytoma.    HCV antibody was normal but RNA not detected.  Abdominal US showed normal liver and LFTs were normal. She has had exposure to HCV in the past and used to use IV drugs.   She had an urticarial rash in the hospital of uncertain etiology (was present at admission), she got Solumedrol and this has resolved.   Echo today showed EF 60-65%, moderate RV dilation with moderately decreased systolic function, D-shaped septum, PASP 53 mmHg (down from 109 mmHg on last echo), normal IVC.   Here today for follow up of pulmonary hypertension. She has been feeling good.  No exertional dyspnea.  Able to walk as far as she wants.  Exercises regularly and does yoga.  No chest pain.  No lightheadedness.  Weight down 6 lbs. She is tolerating her PH meds.  She has been doing pregnancy tests monthly.   6 minute walk (11/21): 427 meters 6 minute walk (12/21): 396.2 meters  Labs (11/21): K 5, creatinine 1.16, RF mildly elevated 14, anti-CCP negative, HCV ab+ but RNA not detected, EBV IgG+ but IgM-, LFTs normal, TSH 22 with free T3 and free T4 normal, anti-SCL70 negative, anti-centromere negative, plasma normetanephrine elevated but 24 hour urine catecholeamines normal, PRA/PAC normal, ANA negative, HIV negative, BNP 905.  Labs (12/21): K 3.7, creatinine 1.0, Digoxin 0.6, BNP 40, serum pregnancy test: negative  PMH: 1. HTN - Renal artery dopplers without hemodynamically significant stenosis.  - Serum normetanephrine elevated but 24 hour urine catecholamines normal => saw endocrinologist, not thought to have pheochromocytoma.  - PRA/PAC ration normal.  2. Hypothyroidism 3. Prior h/o IVDU 4. Active smoker 5. Pulmonary hypertension: Suspect primary pulmonary  hypertension.  - RHC (11/21): mean RA 6, PA 104/56 mean 74, mean PCWP 12, CI 1.41, PVR 23 - Echo (11/21): EF 50-55%, mild LVH, D-shaped septum, severe RV enlargement, moderately decreased RV systolic function, moderate-severe TR, PASP 109, severe RAE.  - V/Q  scan (11/21) negative - CTA chest (11/21): No PE or ILD.  - PFTs (11/21): normal - Rheumatologic serologic workup negative, LFTs normal, HIV negative.  - Echo (2/22): EF 60-65%, moderate RV dilation with moderately decreased systolic function (RV EF 36%), D-shaped septum, PASP 53 mmHg (down from 109 mmHg on last echo), normal IVC.  6. HCV antibody positive: RNA not detected.  Abdominal US showed normal liver.  7. COVID-19 infection 2020  SH: Married, current smoker, prior IVDU. Occasional ETOH.   Family History  Problem Relation Age of Onset  . Hypertension Paternal Grandfather   . Hypertension Mother    ROS: All systems reviewed and negative except as per HPI.   Current Outpatient Medications  Medication Sig Dispense Refill  . ALPRAZolam (XANAX XR) 0.5 MG 24 hr tablet Take 0.5 mg by mouth in the morning, at noon, and at bedtime.    Marland Kitchen amLODipine (NORVASC) 5 MG tablet Take 1 tablet (5 mg total) by mouth daily. 30 tablet 11  . Ascorbic Acid (VITAMIN C) 1000 MG tablet Take 1,000 mg by mouth daily.    . B Complex Vitamins (VITAMIN B COMPLEX PO) Take 1 capsule by mouth daily.    Marland Kitchen BLACK CURRANT SEED OIL PO Take 1 capsule by mouth daily.    Marland Kitchen co-enzyme Q-10 30 MG capsule Take 30 mg by mouth daily.    . Digoxin 62.5 MCG TABS Take 0.0625 mg by mouth daily. 30 tablet 6  . furosemide (LASIX) 40 MG tablet Take 40 mg by mouth daily.    Marland Kitchen HAWTHORN PO Take 1 capsule by mouth daily.    Marland Kitchen ibuprofen (ADVIL) 800 MG tablet Take 400 mg by mouth every 6 (six) hours as needed for moderate pain.     . Iodine, Kelp, (KELP PO) Take 1 capsule by mouth daily.    . lansoprazole (PREVACID) 15 MG capsule Take 15 mg by mouth daily.    Marland Kitchen LETAIRIS 10 MG tablet Take 1 tablet (10 mg total) by mouth daily. 30 tablet 11  . levonorgestrel (MIRENA) 20 MCG/24HR IUD 1 each by Intrauterine route once.    Marland Kitchen levothyroxine (SYNTHROID) 75 MCG tablet Take 1 tablet (75 mcg total) by mouth daily. 90 tablet 3  . Multiple  Vitamins-Minerals (ECHINACEA ACZ PO) Take 1 tablet by mouth daily.    . Omega-3 Fatty Acids (FISH OIL) 1000 MG CAPS Take 1,000 mg by mouth daily.    . tadalafil, PAH, (ADCIRCA) 20 MG tablet Take 2 tablets (40 mg total) by mouth daily. 60 tablet 6  . TURMERIC PO Take 1 capsule by mouth daily.    Marland Kitchen VITAMIN E EX Apply 1 capsule topically daily.    Marland Kitchen zinc gluconate 50 MG tablet Take 50 mg by mouth daily.    . Selexipag 1600 MCG TABS Take 1.5 tablets (2,400 mcg total) by mouth 2 (two) times daily. 60 tablet 11   No current facility-administered medications for this encounter.    BP 100/60   Pulse 82   Wt 77.9 kg (171 lb 12.8 oz)   SpO2 97%   BMI 28.59 kg/m    Wt Readings from Last 3 Encounters:  10/05/20 77.9 kg (171 lb 12.8 oz)  08/25/20 83 kg (183  lb)  07/30/20 80.3 kg (177 lb)  General: NAD Neck: No JVD, no thyromegaly or thyroid nodule.  Lungs: Clear to auscultation bilaterally with normal respiratory effort. CV: Nondisplaced PMI.  Heart regular S1/S2, no S3/S4, no murmur.  No peripheral edema.  No carotid bruit.  Normal pedal pulses.  Abdomen: Soft, nontender, no hepatosplenomegaly, no distention.  Skin: Intact without lesions or rashes.  Neurologic: Alert and oriented x 3.  Psych: Normal affect. Extremities: No clubbing or cyanosis.  HEENT: Normal.   Assessment/Plan:  1. Pulmonary hypertension/RV failure: Echo 11/21 with normal LV size with mild LV hypertrophy, EF 50-55%, D-shaped septum due to RV pressure/volume overload, severely dilated RV with moderately decreased RV systolic function, PASP 109 mmHg, dilated IVC. She had been symptomatic for months prior to this, suspect due to gradually worsening RV failure. CTA chest did not show significant lung pathology and did not show PE and V/Q scan negative. PFTs normal.No diet/weight loss drugs.  HIV negative and rheumatologic serologies negative.  HCV antibody positive but liver normal on [place] and LFTs normal.  RHC in 11/21 showed  severe pulmonary arterial hypertension with low cardiac output and normal filling pressures.  She will need aggressive treatment of PAH and given low output, may need IV therapy eventually.  Echo today showed moderate RV dilation with moderate RV dysfunction, but PASP down to 53 mmHg (109 mmHg on prior echo).  Suspect primary pulmonary hypertension. She feels good, NYHA class I-II symptoms, not volume overloaded on exam.   - Continue Lasix 40 mg daily.  BMET/BNP.     - Continue tadalafil 40 mg daily. - Continue digoxin 0.0625 daily for RV support, check level today.  - Continue ambrisentan 10 mg daily today.  She has IUD and will need monthly pregnancy tests. Serum pregnancy test today. - Arrange for home sleep study, still needs to be done.  - Continue to titrate up on Uptravi, goal 2400 mcg bid.  - 6 minute walk today.  2. Systemic HTN: Patient has had systemic HTN for some time. She has a strong family history of this. She thinks losartan and diltiazem caused facial swelling so has stopped them. She thinks metoprolol caused a rash. Concern for secondary HTN given young age (and family history).  Renal artery dopplers in 11/21 without evidence for hemodynamically significant stenosis.  Plasma renin and aldosterone ratio normal.  Serum normetanephrine elevated at 213 pg/mL with normal metanephrine.  This can be a false positive.  Since 24 hour urine catecholamines were negative, suspect not pheochromocytoma. BP now controlled.    - Continue amlodipine to 5 mg daily. 3. Hypothyroidism: Continue Levoxyl. .  4. 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 Says she has quit.  5. Hep C: HCV ab reactive. H/o IVDU and has had prior exposure to HCV. Abdominal US without cirrhosis and LFTs normal.  HCV RNA was negative => suspect infection was cleared.   Followup in 3 months.   Marca Ancona 10/05/2020

## 2020-10-05 NOTE — Patient Instructions (Addendum)
Labs done today. We will contact you only if your labs are abnormal.  INCREASE Selexipag gradually to 2400 mcg (1 &1/2 tablets) by mouth 2 times daily. (will gradually increase it,pharmacy will give you directions on how to increase)  No other medication changes were made. Please continue all current medications as prescribed.  Your provider has requested that you have a sleep study done at the sleep center. They will contact you to schedule an appointment.  Your physician recommends that you schedule a follow-up appointment in: 3 months    If you have any questions or concerns before your next appointment please send [place] a message through [place] or call our office at [phone].    TO LEAVE A MESSAGE FOR THE NURSE SELECT OPTION 2, PLEASE LEAVE A MESSAGE INCLUDING: . YOUR NAME . DATE OF BIRTH . CALL BACK NUMBER . REASON FOR CALL**this is important as we prioritize the call backs  YOU WILL RECEIVE A CALL BACK THE SAME DAY AS LONG AS YOU CALL BEFORE 4:00 PM   Do the following things EVERYDAY: 1) Weigh yourself in the morning before breakfast. Write it down and keep it in a log. 2) Take your medicines as prescribed 3) Eat low salt foods--Limit salt (sodium) to 2000 mg per day.  4) Stay as active as you can everyday 5) Limit all fluids for the day to less than 2 liters   At the [facility], you and your health needs are our priority. As part of our continuing mission to provide you with exceptional heart care, we have created designated Provider Care Teams. These Care Teams include your primary Cardiologist (physician) and Advanced Practice Providers (APPs- Physician Assistants and Nurse Practitioners) who all work together to provide you with the care you need, when you need it.   You may see any of the following providers on your designated Care Team at your next follow up: Marland Kitchen Dr Arvilla Meres . Dr Marca Ancona . Tonye Becket, NP . Robbie Lis, PA . Karle Plumber, PharmD   Please be sure to bring in all your medications bottles to every appointment.

## 2020-10-05 NOTE — Progress Notes (Signed)
  Echocardiogram 2D Echocardiogram has been performed.  Zoe Roach 10/05/2020, 11:02 AM

## 2020-10-05 NOTE — Progress Notes (Deleted)
Patient Name: Zoe Roach        DOB: [birth year]      Height: 5'5    Weight:171.8lbs  Office Name: [facility]         Referring Provider: Dr. Marca Ancona  Today's Date:10/05/20  Date:  10/05/20 STOP BANG RISK ASSESSMENT S (snore) Have you been told that you snore?     NO   T (tired) Are you often tired, fatigued, or sleepy during the day?   YES  O (obstruction) Do you stop breathing, choke, or gasp during sleep? NO   P (pressure) Do you have or are you being treated for high blood pressure? YES   B (BMI) Is your body index greater than 35 kg/m? NO   A (age) Are you 17 years old or older? NO   N (neck) Do you have a neck circumference greater than 16 inches?   YES/NO   G (gender) Are you a female? NO   TOTAL STOP/BANG "YES" ANSWERS 2                                                                       For Office Use Only              Procedure Order Form    YES to 3+ Stop Bang questions OR two clinical symptoms - patient qualifies for WatchPAT (CPT 95800)             Clinical Notes: Will consult Sleep Specialist and refer for management of therapy due to patient increased risk of Sleep Apnea. Ordering a sleep study due to the following two clinical symptoms: Excessive daytime sleepiness G47.10 / History of high blood pressure R03.0   I understand that I am proceeding with a home sleep apnea test as ordered by my treating physician. I understand that untreated sleep apnea is a serious cardiovascular risk factor and it is my responsibility to perform the test and seek management for sleep apnea. I will be contacted with the results and be managed for sleep apnea by a local sleep physician. I will be receiving equipment and further instructions from [facility]. I shall promptly ship back the equipment via the included mailing label. I understand my insurance will be billed for the test and as the patient I am responsible for any insurance related  out-of-pocket costs incurred. I have been provided with written instructions and can call for additional video or telephonic instruction, with 24-hour availability of qualified personnel to answer any questions: Patient Help Desk [phone].  Patient Signature ______________________________________________________   Date______________________ Patient Telemedicine Verbal Consent

## 2020-10-06 ENCOUNTER — Telehealth (HOSPITAL_COMMUNITY): Payer: Self-pay | Admitting: Pharmacist

## 2020-10-06 MED ORDER — SELEXIPAG 1600 MCG PO TABS: ORAL_TABLET | ORAL | 11 refills | Status: DC

## 2020-10-06 NOTE — Telephone Encounter (Signed)
Received message from [facility] that a new prescription had been received for Uptravi without titration orders. Verbally provided pharmacist with titration orders for Uptravi. Patient is currently taking 1600 mcg BID. She should increase by 200 mcg BID weekly to new target dose of 2400 mcg BID.   Karle Plumber, PharmD, BCPS, BCCP, CPP [facility] Pharmacist [phone]

## 2020-10-07 LAB — HCG, SERUM, QUALITATIVE: Preg, Serum: NEGATIVE

## 2020-10-15 ENCOUNTER — Telehealth (HOSPITAL_COMMUNITY): Payer: Self-pay | Admitting: *Deleted

## 2020-10-15 ENCOUNTER — Other Ambulatory Visit (HOSPITAL_COMMUNITY): Payer: Self-pay | Admitting: *Deleted

## 2020-10-15 MED ORDER — DIGOXIN 125 MCG PO TABS: 0.0625 mg | ORAL_TABLET | Freq: Every day | ORAL | 3 refills | Status: DC

## 2020-10-15 NOTE — Telephone Encounter (Signed)
Pt left VM stating for the past couple of weeks her nose has been extremely stuffy she took allergy meds it dried her nose out but she is still stuffy. She has tried saline rinse and sprays but thinks its a side effect of one of her medications. Pt also c/o swelling in her ankles she said she noticed the swelling yesterday and is concerned because she normally does not retain fluid. No other complaints at this time. Pt is taking all meds as prescribed. Denies shortness of breath, chest pain, or fatigue. Denies weight gain, increase in appetite or fluid intake.   Routed to Dr.McLean for advice

## 2020-10-15 NOTE — Telephone Encounter (Signed)
With the swelling in her ankles, she can take Lasix 40 qam/20 qpm x 3 days then back to 40 mg dailiy.  Not sure meds are causing the stuffiness.

## 2020-10-15 NOTE — Telephone Encounter (Signed)
Pt aware and agreeable with plan. Pt will see her PCP Wednesday about stuffy nose.

## 2020-11-19 ENCOUNTER — Telehealth (HOSPITAL_COMMUNITY): Payer: Self-pay | Admitting: *Deleted

## 2020-11-19 NOTE — Telephone Encounter (Signed)
Pt left VM stating her weight is up 10lbs x3 weeks. Pt did not call to report weight gain but instead would increase lasix to 60mg  daily or 80mg  daily. Pt has not lost any weight and feels she has fluid. Per Dr.McLean take lasix 40mg  bid and come for nurse visit (reds clip, bmet, and bnp) Monday. Pt aware but said she would have to call Monday morning to schedule lab visit.

## 2020-11-22 ENCOUNTER — Ambulatory Visit (HOSPITAL_COMMUNITY)
Admission: RE | Admit: 2020-11-22 | Discharge: 2020-11-22 | Disposition: A | Discharge status: Home / Self Care | Payer: Medicaid Other | Source: Ambulatory Visit | Attending: Internal Medicine | Admitting: Internal Medicine

## 2020-11-22 ENCOUNTER — Other Ambulatory Visit (HOSPITAL_COMMUNITY): Payer: Self-pay | Admitting: *Deleted

## 2020-11-22 ENCOUNTER — Other Ambulatory Visit: Payer: Self-pay

## 2020-11-22 DIAGNOSIS — I272 Pulmonary hypertension, unspecified: Secondary | ICD-10-CM

## 2020-11-22 LAB — BASIC METABOLIC PANEL
Anion gap: 6 (ref 5–15)
BUN: 15 mg/dL (ref 6–20)
CO2: 30 mmol/L (ref 22–32)
Calcium: 9.2 mg/dL (ref 8.9–10.3)
Chloride: 99 mmol/L (ref 98–111)
Creatinine, Ser: 1.02 mg/dL — ABNORMAL HIGH (ref 0.44–1.00)
GFR, Estimated: >60 mL/min (ref >60)
Glucose, Bld: 101 mg/dL — ABNORMAL HIGH (ref 70–99)
Potassium: 3.6 mmol/L (ref 3.5–5.1)
Sodium: 135 mmol/L (ref 135–145)

## 2020-11-24 ENCOUNTER — Other Ambulatory Visit: Payer: Self-pay

## 2020-11-24 ENCOUNTER — Ambulatory Visit (INDEPENDENT_AMBULATORY_CARE_PROVIDER_SITE_OTHER): Payer: Medicaid Other | Admitting: Psychiatry

## 2020-11-24 ENCOUNTER — Encounter (HOSPITAL_COMMUNITY): Payer: Self-pay | Admitting: Psychiatry

## 2020-11-24 VITALS — BP 118/79 | HR 97 | Ht 65.0 in | Wt 173.0 lb

## 2020-11-24 DIAGNOSIS — F320 Major depressive disorder, single episode, mild: Secondary | ICD-10-CM | POA: Diagnosis not present

## 2020-11-24 DIAGNOSIS — F32A Depression, unspecified: Secondary | ICD-10-CM

## 2020-11-24 DIAGNOSIS — F411 Generalized anxiety disorder: Secondary | ICD-10-CM
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
## 2020-11-29 ENCOUNTER — Ambulatory Visit (INDEPENDENT_AMBULATORY_CARE_PROVIDER_SITE_OTHER): Payer: Medicaid Other | Admitting: Licensed Clinical Social Worker

## 2020-11-29 ENCOUNTER — Encounter (HOSPITAL_COMMUNITY): Payer: Self-pay | Admitting: Licensed Clinical Social Worker

## 2020-11-29 ENCOUNTER — Encounter (HOSPITAL_BASED_OUTPATIENT_CLINIC_OR_DEPARTMENT_OTHER): Payer: Medicaid Other | Admitting: Cardiology

## 2020-11-29 ENCOUNTER — Other Ambulatory Visit: Payer: Self-pay

## 2020-11-29 DIAGNOSIS — F411 Generalized anxiety disorder: Secondary | ICD-10-CM

## 2020-11-29 DIAGNOSIS — F32A Depression, unspecified: Secondary | ICD-10-CM

## 2020-11-29 DIAGNOSIS — F320 Major depressive disorder, single episode, mild: Secondary | ICD-10-CM
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
## 2020-12-01 ENCOUNTER — Ambulatory Visit (INDEPENDENT_AMBULATORY_CARE_PROVIDER_SITE_OTHER): Payer: Medicaid Other | Admitting: Internal Medicine

## 2020-12-01 ENCOUNTER — Encounter: Payer: Self-pay | Admitting: Internal Medicine

## 2020-12-01 ENCOUNTER — Other Ambulatory Visit: Payer: Self-pay

## 2020-12-01 VITALS — BP 132/74 | HR 84 | Ht 65.0 in | Wt 178.2 lb

## 2020-12-01 DIAGNOSIS — E039 Hypothyroidism, unspecified: Secondary | ICD-10-CM | POA: Diagnosis not present

## 2020-12-01 LAB — TSH: TSH: 5.78 u[IU]/mL — ABNORMAL HIGH (ref 0.35–4.50)

## 2020-12-01 NOTE — Patient Instructions (Signed)

## 2020-12-01 NOTE — Progress Notes (Signed)
Name: Zoe Roach  MRN/ DOB: [id number], 12/23/1990    Age/ Sex: 30 y.o., female     PCP: Leilani Able, MD   Reason for Endocrinology Evaluation: Hypothyroidism     Initial Endocrinology Clinic Visit: 08/25/2020    PATIENT IDENTIFIER: Zoe Roach is a 30 y.o., female with a past medical history of HTN , IV drug abuse, Pulmonary HTN and RV failure. She has followed with [facility] since 08/25/2020 for consultative assistance with management of her Elevated catecholamines   HISTORICAL SUMMARY:   During evaluation for HTN/ Pulmonary hypertension and RV failure she was noted to have a slightly elevated (06/2020)  plasma  normetanephrine's 213.9 with normal plasma metanephrine.  Urinary dopamine, epinephrines, metanephrines and normetanephrines have all come back normal with slight elevation of norepinephrine at 141  ug ( reference 0-135 )   Aldo and renin are normal at 2.8 and 1.031 respectively.  Repeat 24-hr urine catecholamine and cortisol were all normal by 08/2020     THYROID HISTORY:  She has been diagnosed with hypothyroidism in the summer of 2021 , she was started  On  levothyroxine at the time.   SUBJECTIVE:    Today (12/01/2020):  Zoe Roach is here for hypothyroidism.  She has been more tired then usual  Has occasional constipation  Denies local neck symptoms  Weight has been fluctuating   Levothyroxine 75 mcg daily     HISTORY:  Past Medical History:  Past Medical History:  Diagnosis Date  . CHF (congestive heart failure) (HCC)   . History of COVID-19   . Hypothyroidism (acquired)   . IVDU (intravenous drug user)   . SOB (shortness of breath)    Past Surgical History:  Past Surgical History:  Procedure Laterality Date  . CESAREAN SECTION    . RIGHT HEART CATH N/A 06/21/2020   Procedure: RIGHT HEART CATH;  Surgeon: Laurey Morale, MD;  Location: [facility] INVASIVE CV LAB;  Service: Cardiovascular;  Laterality: N/A;     Social History:  reports that she has been smoking. She has a 7.50 pack-year smoking history. She has never used smokeless tobacco. She reports current alcohol use. She reports current drug use. Family History:  Family History  Problem Relation Age of Onset  . Hypertension Paternal Grandfather   . Hypertension Mother      HOME MEDICATIONS: Allergies as of 12/01/2020      Reactions   Almond (diagnostic) Hives   Cherry Hives   Diltiazem Other (See Comments)   Swollen face   Losartan Swelling   Peach [prunus Persica] Hives   Metoprolol Rash      Medication List       Accurate as of December 01, 2020  3:50 PM. If you have any questions, ask your nurse or doctor.        ALPRAZolam 0.5 MG 24 hr tablet Commonly known as: XANAX XR Take 0.5 mg by mouth in the morning, at noon, and at bedtime.   amLODipine 5 MG tablet Commonly known as: NORVASC Take 1 tablet (5 mg total) by mouth daily.   BLACK CURRANT SEED OIL PO Take 1 capsule by mouth daily.   co-enzyme Q-10 30 MG capsule Take 30 mg by mouth daily.   digoxin 0.125 MG tablet Commonly known as: LANOXIN Take 0.5 tablets (0.0625 mg total) by mouth daily.   ECHINACEA ACZ PO Take 1 tablet by mouth daily.   Fish Oil 1000 MG Caps Take 1,000 mg by mouth  daily.   furosemide 40 MG tablet Commonly known as: LASIX Take 40 mg by mouth in the morning and at bedtime.   HAWTHORN PO Take 1 capsule by mouth daily.   ibuprofen 800 MG tablet Commonly known as: ADVIL Take 400 mg by mouth every 6 (six) hours as needed for moderate pain.   KELP PO Take 1 capsule by mouth daily.   lansoprazole 15 MG capsule Commonly known as: PREVACID Take 15 mg by mouth daily.   Letairis 10 MG tablet Generic drug: ambrisentan Take 1 tablet (10 mg total) by mouth daily.   levonorgestrel 20 MCG/24HR IUD Commonly known as: MIRENA 1 each by Intrauterine route once.   levothyroxine 75 MCG tablet Commonly known as: SYNTHROID Take 1 tablet  (75 mcg total) by mouth daily.   Selexipag 1600 MCG Tabs Take 2400 mcg BID. Increase by 200 mcg BID weekly to new target dose of 2400 mcg BID. Will use 1600 mcg tablets and 800/200 mcg tablets for titration.   tadalafil (PAH) 20 MG tablet Commonly known as: ADCIRCA Take 2 tablets (40 mg total) by mouth daily.   TURMERIC PO Take 1 capsule by mouth daily.   VITAMIN B COMPLEX PO Take 1 capsule by mouth daily.   vitamin C 1000 MG tablet Take 1,000 mg by mouth daily.   VITAMIN E EX Apply 1 capsule topically daily.   zinc gluconate 50 MG tablet Take 50 mg by mouth daily.         OBJECTIVE:   PHYSICAL EXAM: VS: BP 132/74   Pulse 84   Ht 5\' 5"  (1.651 m)   Wt 178 lb 4 oz (80.9 kg)   SpO2 99%   BMI 29.66 kg/m    EXAM: General: Pt appears well and is in NAD  Neck: General: Supple without adenopathy. Thyroid: Thyroid size normal.  No goiter or nodules appreciated. No thyroid bruit.  Lungs: Clear with good BS bilat with no rales, rhonchi, or wheezes  Heart: Auscultation: RRR.  Abdomen: Normoactive bowel sounds, soft, nontender, without masses or organomegaly palpable  Extremities:  BL LE: No pretibial edema normal ROM and strength.     DATA REVIEWED:  Results for GWENDLYN, HANBACK (MRN Vicki Mallet) as of 12/02/2020 12:06  Ref. Range 12/01/2020 16:06  TSH Latest Ref Range: 0.35 - 4.50 uIU/mL 5.78 (H)     ASSESSMENT / PLAN / RECOMMENDATIONS:  1. Hypothyroidism :   - Pt with fatigue  - No local neck symptoms  -Repeat TSH elevated at 5.78U IU/mL, will increase levothyroxine dose as below   Medications :  Stop levothyroxine 75 mcg daily  Start levothyroxine 88 MCG daily    F/U in 6 months  Labs in 8 weeks   Signed electronically by: 12/03/2020, MD  [facility]  [facility] [address] [place], [place] Phone: [phone] FAX: [phone]      CC: [phone], MD [address]  [place] Phone: [phone]  Fax: [phone]   Return to Endocrinology clinic as below: Future Appointments  Date Time Provider Department Center  12/20/2020  8:00 PM 02/19/2021, MD [facility]  01/05/2021  4:00 PM 01/07/2021, LCSW [facility] None  01/07/2021  1:40 PM 01/09/2021, MD [facility] None  01/18/2021  3:00 PM 03/20/2021, LCSW [facility] None

## 2020-12-02 MED ORDER — LEVOTHYROXINE SODIUM 88 MCG PO TABS: 88.0000 ug | ORAL_TABLET | Freq: Every day | ORAL | 3 refills | Status: DC

## 2020-12-15 ENCOUNTER — Telehealth (HOSPITAL_COMMUNITY): Payer: Self-pay | Admitting: *Deleted

## 2020-12-15 NOTE — Telephone Encounter (Signed)
Received call from pts mother that pt has been sleeping a lot since this weekend. pts feet and legs aching. Pt told mother she had not called the office because she had not been thinking straight. Mother asked that we call pt. I called pt she said she had a migraine Monday and vomited and her body has been aching since then. Pt denies shortness of breath and said her vitals and weight are fine. Pt took a home covid test yesterday and it was negative. Pt said she will contact her pcp for an in office covid test. Pt is main complaint is fatigue and body aches.

## 2020-12-20 ENCOUNTER — Other Ambulatory Visit: Payer: Self-pay

## 2020-12-20 ENCOUNTER — Ambulatory Visit (HOSPITAL_BASED_OUTPATIENT_CLINIC_OR_DEPARTMENT_OTHER): Payer: Medicaid Other | Attending: Cardiology | Admitting: Cardiology

## 2020-12-20 DIAGNOSIS — I272 Pulmonary hypertension, unspecified: Secondary | ICD-10-CM | POA: Diagnosis present

## 2020-12-20 DIAGNOSIS — G4736 Sleep related hypoventilation in conditions classified elsewhere: Secondary | ICD-10-CM | POA: Insufficient documentation

## 2020-12-20 DIAGNOSIS — G4734 Idiopathic sleep related nonobstructive alveolar hypoventilation: Secondary | ICD-10-CM | POA: Diagnosis not present

## 2020-12-20 DIAGNOSIS — R400 Somnolence: Secondary | ICD-10-CM | POA: Insufficient documentation

## 2020-12-20 DIAGNOSIS — I10 Essential (primary) hypertension: Secondary | ICD-10-CM | POA: Diagnosis present

## 2020-12-20 DIAGNOSIS — Z79899 Other long term (current) drug therapy: Secondary | ICD-10-CM | POA: Diagnosis not present

## 2020-12-27 NOTE — Procedures (Signed)
    Patient Name: Zoe Roach, Zoe Roach Date: 12/20/2020 Gender: Female D.O.B: 10-18-1990 Age (years): 30 Referring Provider: Marca Ancona Height (inches): 65 Interpreting Physician: Armanda Magic MD, ABSM Weight (lbs): 170 RPSGT: Rosette Reveal BMI: 28 MRN: [id number] Neck Size: 14.00  CLINICAL INFORMATION Sleep Study Type: NPSG  Indication for sleep study: Depression, Hypertension  Epworth Sleepiness Score: 8  SLEEP STUDY TECHNIQUE As per the AASM Manual for the Scoring of Sleep and Associated Events v2.3 (April 2016) with a hypopnea requiring 4% desaturations.  The channels recorded and monitored were frontal, central and occipital EEG, electrooculogram (EOG), submentalis EMG (chin), nasal and oral airflow, thoracic and abdominal wall motion, anterior tibialis EMG, snore microphone, electrocardiogram, and pulse oximetry.  MEDICATIONS Medications self-administered by patient taken the night of the study : XANAX  SLEEP ARCHITECTURE The study was initiated at 10:48:54 PM and ended at 5:05:41 AM.  Sleep onset time was 102.5 minutes and the sleep efficiency was 67.3%. The total sleep time was 253.5 minutes.  Stage REM latency was 98.0 minutes.  The patient spent 3.7% of the night in stage N1 sleep, 83.6% in stage N2 sleep, 0.0% in stage N3 and 12.6% in REM.  Alpha intrusion was absent.  Supine sleep was 48.32%.  RESPIRATORY PARAMETERS The overall apnea/hypopnea index (AHI) was 0.0 per hour. There were 0 total apneas, including 0 obstructive, 0 central and 0 mixed apneas. There were 0 hypopneas and 1 RERAs.  The AHI during Stage REM sleep was 0.0 per hour.  AHI while supine was 0.0 per hour.  The mean oxygen saturation was 87.6%. The minimum SpO2 during sleep was 82.0%.  snoring was noted during this study.  CARDIAC DATA The 2 lead EKG demonstrated sinus rhythm. The mean heart rate was 80.5 beats per minute. Other EKG findings include: None.  LEG MOVEMENT  DATA The total PLMS were 0 with a resulting PLMS index of 0.0. Associated arousal with leg movement index was 0.0 .  IMPRESSIONS - No significant obstructive sleep apnea occurred during this study (AHI = 0.0/h). - Mild oxygen desaturation was noted during this study (Min O2 = 82.0%). - No snoring was audible during this study. - No cardiac abnormalities were noted during this study. - Clinically significant periodic limb movements did not occur during sleep. No significant associated arousals.  DIAGNOSIS - Nocturnal Hypoxemia (G47.36)  RECOMMENDATIONS - Avoid alcohol, sedatives and other CNS depressants that may worsen sleep apnea and disrupt normal sleep architecture. - Sleep hygiene should be reviewed to assess factors that may improve sleep quality. - Weight management and regular exercise should be initiated or continued if appropriate. - Recommend overnight pulse ox once nasal congestion issues have resolved to document accurate readings.  If Nocturnal hypoxemia noted, then recommend O2 at 2L [place] nightly during sleep.   [Electronically signed] 12/27/2020 05:38 PM  Armanda Magic MD, ABSM Diplomate, American Board of Sleep Medicine

## 2020-12-29 ENCOUNTER — Telehealth: Payer: Self-pay | Admitting: *Deleted

## 2020-12-29 DIAGNOSIS — R0902 Hypoxemia: Secondary | ICD-10-CM

## 2020-12-29 NOTE — Telephone Encounter (Signed)
-----   Message from Quintella Reichert, MD sent at 12/27/2020  6:01 PM EDT ----- No sleep apnea but significant hypoxemia - please order an overnight pulse ox - apparently patient had a lot of nasal congestion that the tech though may be affecting her oxygenation

## 2020-12-29 NOTE — Telephone Encounter (Signed)
The patient has been notified of the result and verbalized understanding.  All questions (if any) were answered. Pt is aware and agreeable to order an overnight pulse ox with [facility].

## 2021-01-05 ENCOUNTER — Ambulatory Visit (INDEPENDENT_AMBULATORY_CARE_PROVIDER_SITE_OTHER): Payer: Medicaid Other | Admitting: Licensed Clinical Social Worker

## 2021-01-05 ENCOUNTER — Other Ambulatory Visit: Payer: Self-pay

## 2021-01-05 DIAGNOSIS — F320 Major depressive disorder, single episode, mild: Secondary | ICD-10-CM

## 2021-01-05 DIAGNOSIS — F32A Depression, unspecified: Secondary | ICD-10-CM

## 2021-01-05 DIAGNOSIS — F411 Generalized anxiety disorder: Secondary | ICD-10-CM | POA: Diagnosis not present
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
## 2021-01-07 ENCOUNTER — Ambulatory Visit (HOSPITAL_COMMUNITY)
Admission: RE | Admit: 2021-01-07 | Discharge: 2021-01-07 | Disposition: A | Discharge status: Home / Self Care | Payer: Medicaid Other | Source: Ambulatory Visit | Attending: Cardiology | Admitting: Cardiology

## 2021-01-07 ENCOUNTER — Encounter (HOSPITAL_COMMUNITY): Payer: Self-pay | Admitting: Cardiology

## 2021-01-07 ENCOUNTER — Other Ambulatory Visit: Payer: Self-pay

## 2021-01-07 VITALS — BP 102/60 | HR 72 | Wt 178.4 lb

## 2021-01-07 DIAGNOSIS — I119 Hypertensive heart disease without heart failure: Secondary | ICD-10-CM | POA: Insufficient documentation

## 2021-01-07 DIAGNOSIS — Z79890 Hormone replacement therapy (postmenopausal): Secondary | ICD-10-CM | POA: Diagnosis not present

## 2021-01-07 DIAGNOSIS — F17200 Nicotine dependence, unspecified, uncomplicated: Secondary | ICD-10-CM | POA: Diagnosis not present

## 2021-01-07 DIAGNOSIS — I272 Pulmonary hypertension, unspecified: Secondary | ICD-10-CM | POA: Diagnosis present

## 2021-01-07 DIAGNOSIS — E039 Hypothyroidism, unspecified: Secondary | ICD-10-CM | POA: Insufficient documentation

## 2021-01-07 DIAGNOSIS — F1121 Opioid dependence, in remission: Secondary | ICD-10-CM | POA: Diagnosis not present

## 2021-01-07 DIAGNOSIS — Z8249 Family history of ischemic heart disease and other diseases of the circulatory system: Secondary | ICD-10-CM | POA: Insufficient documentation

## 2021-01-07 DIAGNOSIS — Z79899 Other long term (current) drug therapy: Secondary | ICD-10-CM | POA: Diagnosis not present

## 2021-01-07 DIAGNOSIS — R0902 Hypoxemia: Secondary | ICD-10-CM | POA: Diagnosis not present

## 2021-01-07 DIAGNOSIS — B1920 Unspecified viral hepatitis C without hepatic coma: Secondary | ICD-10-CM | POA: Diagnosis not present

## 2021-01-07 DIAGNOSIS — Z8616 Personal history of COVID-19: Secondary | ICD-10-CM | POA: Diagnosis not present

## 2021-01-07 DIAGNOSIS — R5383 Other fatigue: Secondary | ICD-10-CM | POA: Insufficient documentation

## 2021-01-07 DIAGNOSIS — Z793 Long term (current) use of hormonal contraceptives: Secondary | ICD-10-CM | POA: Insufficient documentation

## 2021-01-07 DIAGNOSIS — I517 Cardiomegaly: Secondary | ICD-10-CM | POA: Diagnosis not present

## 2021-01-07 LAB — BASIC METABOLIC PANEL
Anion gap: 6 (ref 5–15)
BUN: 13 mg/dL (ref 6–20)
CO2: 28 mmol/L (ref 22–32)
Calcium: 9.1 mg/dL (ref 8.9–10.3)
Chloride: 102 mmol/L (ref 98–111)
Creatinine, Ser: 1.15 mg/dL — ABNORMAL HIGH (ref 0.44–1.00)
GFR, Estimated: >60 mL/min (ref >60)
Glucose, Bld: 86 mg/dL (ref 70–99)
Potassium: 3.6 mmol/L (ref 3.5–5.1)
Sodium: 136 mmol/L (ref 135–145)

## 2021-01-07 LAB — BRAIN NATRIURETIC PEPTIDE: B Natriuretic Peptide: 32 pg/mL (ref 0.0–100.0)

## 2021-01-07 LAB — DIGOXIN LEVEL: Digoxin Level: 0.3 ng/mL — ABNORMAL LOW (ref 0.8–2.0)

## 2021-01-07 LAB — HCG, SERUM, QUALITATIVE: Preg, Serum: NEGATIVE

## 2021-01-07 NOTE — Patient Instructions (Addendum)
6 minute walk test done today.  Labs done today. We will contact you only if your labs are abnormal.  No medication changes were made. Please continue all current medications as prescribed.  Home health we be in contact with you regarding the overnight pulse oximetry.   Your physician recommends that you schedule a follow-up appointment in: 3 months  If you have any questions or concerns before your next appointment please send [place] a message through [place] or call our office at [phone].    TO LEAVE A MESSAGE FOR THE NURSE SELECT OPTION 2, PLEASE LEAVE A MESSAGE INCLUDING: . YOUR NAME . DATE OF BIRTH . CALL BACK NUMBER . REASON FOR CALL**this is important as we prioritize the call backs  YOU WILL RECEIVE A CALL BACK THE SAME DAY AS LONG AS YOU CALL BEFORE 4:00 PM   Do the following things EVERYDAY: 1) Weigh yourself in the morning before breakfast. Write it down and keep it in a log. 2) Take your medicines as prescribed 3) Eat low salt foods--Limit salt (sodium) to 2000 mg per day.  4) Stay as active as you can everyday 5) Limit all fluids for the day to less than 2 liters   At the [facility], you and your health needs are our priority. As part of our continuing mission to provide you with exceptional heart care, we have created designated Provider Care Teams. These Care Teams include your primary Cardiologist (physician) and Advanced Practice Providers (APPs- Physician Assistants and Nurse Practitioners) who all work together to provide you with the care you need, when you need it.   You may see any of the following providers on your designated Care Team at your next follow up: Marland Kitchen Dr Arvilla Meres . Dr Marca Ancona . Tonye Becket, NP . Robbie Lis, PA . Karle Plumber, PharmD   Please be sure to bring in all your medications bottles to every appointment.

## 2021-01-07 NOTE — Progress Notes (Signed)
6 Min Walk Test Completed  Pt ambulated 518.16 meters O2 Sat ranged 95%-99% on room air HR ranged 87-103

## 2021-01-09 NOTE — Progress Notes (Signed)
PCP: Leilani Able, MD Cardiology: Dr. Shirlee Latch  30 y.o. with history of pulmonary hypertension and systemic hypertension presents for hospital followup after recent admission with RV failure. Patient also has prior history of IV drug use and COVID-19 infection roughly 1 year ago.  She was referred to general cardiology in 11/21 for evaluation of hypertension and abnormal EKG with findings consistent with right ventricular hypertrophy and RV strain.  She was initially placed on losartan for systemic hypertension but this was discontinued due to facial swelling concerning for angioedema. She was tried on diltiazem CD but also had trouble with this medication as well as Toprol XL.  She also complained of persistent dyspnea that had been present over the last year, following her COVID-19 infection.  Dyspnea gradually worsened over time to the point where it was present with minimal exertion.  Echo was done at the [facility] office in 11/21, showing EF 50-55%, mild LVH, D-shaped septum, severe RV enlargement, moderately decreased RV systolic function, moderate-severe TR, PASP 109, severe RAE.  Based on these findings, she was admitted to [facility] for evaluation of RV failure.    She was volume overloaded on exam and was diuresed with IV Lasix with significant improvement in her breathing.  RHC showed severe pulmonary arterial hypertension and low cardiac output.  V/Q scan and CTA chest were not suggestive of acute or chronic PE, PFTs were normal. Rheumatologic serologies and HIV were negative.  She was started on ambrisentan and tadalafil in the hospital.   In terms of her systemic HTN, were were concerned about secondary causes given her young age.  Renal artery dopplers did not show significant stenosis (worried about FMD), renin/aldosterone ratio was normal.  Plasma normetanephrine was elevated but 24 hour urine catecholamines were normal. She saw an endocrinologist and is not thought to have pheochromocytoma.    HCV antibody was normal but RNA not detected.  Abdominal US showed normal liver and LFTs were normal. She has had exposure to HCV in the past and used to use IV drugs.   She had an urticarial rash in the hospital of uncertain etiology (was present at admission), she got Solumedrol and this has resolved.   Echo 2/22 showed EF 60-65%, moderate RV dilation with moderately decreased systolic function, D-shaped septum, PASP 53 mmHg (down from 109 mmHg on last echo), normal IVC.   Here today for follow up of pulmonary hypertension. She continues to feel well in general.  Sleep study showed no OSA but she still reports fatigue and daytime sleepiness.  The sleep study does suggest nocturnal hypoxemia.  No significant exertional dyspnea.  No problems walking up stairs.  No problems with regular exercise (yoga, elliptical).  Improved 6 minute walk. Weight up about 7 lbs.  She has been getting regular pregnancy tests based on [facility] program.  6 minute walk (11/21): 427 meters 6 minute walk (12/21): 396.2 meters 6 minute walk (5/22): 518 meters  Labs (11/21): K 5, creatinine 1.16, RF mildly elevated 14, anti-CCP negative, HCV ab+ but RNA not detected, EBV IgG+ but IgM-, LFTs normal, TSH 22 with free T3 and free T4 normal, anti-SCL70 negative, anti-centromere negative, plasma normetanephrine elevated but 24 hour urine catecholeamines normal, PRA/PAC normal, ANA negative, HIV negative, BNP 905.  Labs (12/21): K 3.7, creatinine 1.0, Digoxin 0.6, BNP 40, serum pregnancy test: negative Labs (4/22): K 3.6, creatinine 1.02  PMH: 1. HTN - Renal artery dopplers without hemodynamically significant stenosis.  - Serum normetanephrine elevated but 24 hour urine  catecholamines normal => saw endocrinologist, not thought to have pheochromocytoma.  - PRA/PAC ration normal.  2. Hypothyroidism 3. Prior h/o IVDU 4. Active smoker 5. Pulmonary hypertension: Suspect primary pulmonary hypertension.  - RHC (11/21): mean RA  6, PA 104/56 mean 74, mean PCWP 12, CI 1.41, PVR 23 - Echo (11/21): EF 50-55%, mild LVH, D-shaped septum, severe RV enlargement, moderately decreased RV systolic function, moderate-severe TR, PASP 109, severe RAE.  - V/Q scan (11/21) negative - CTA chest (11/21): No PE or ILD.  - PFTs (11/21): normal - Rheumatologic serologic workup negative, LFTs normal, HIV negative.  - Echo (2/22): EF 60-65%, moderate RV dilation with moderately decreased systolic function (RV EF 36%), D-shaped septum, PASP 53 mmHg (down from 109 mmHg on last echo), normal IVC.  - Negative sleep study.  6. HCV antibody positive: RNA not detected.  Abdominal US showed normal liver.  7. COVID-19 infection 2020  SH: Married, current smoker, prior IVDU. Occasional ETOH.   Family History  Problem Relation Age of Onset  . Hypertension Paternal Grandfather   . Hypertension Mother    ROS: All systems reviewed and negative except as per HPI.   Current Outpatient Medications  Medication Sig Dispense Refill  . ALPRAZolam (XANAX XR) 0.5 MG 24 hr tablet Take 0.5 mg by mouth in the morning, at noon, and at bedtime.    Marland Kitchen amLODipine (NORVASC) 5 MG tablet Take 1 tablet (5 mg total) by mouth daily. 30 tablet 11  . Ascorbic Acid (VITAMIN C) 1000 MG tablet Take 1,000 mg by mouth daily.    . B Complex Vitamins (VITAMIN B COMPLEX PO) Take 1 capsule by mouth daily.    Marland Kitchen BLACK CURRANT SEED OIL PO Take 1 capsule by mouth daily.    . cetirizine (ZYRTEC) 10 MG tablet Take 10 mg by mouth daily.    Marland Kitchen co-enzyme Q-10 30 MG capsule Take 30 mg by mouth daily.    . digoxin (LANOXIN) 0.125 MG tablet Take 0.5 tablets (0.0625 mg total) by mouth daily. 45 tablet 3  . escitalopram (LEXAPRO) 20 MG tablet Take 20 mg by mouth daily.    . furosemide (LASIX) 40 MG tablet Take 40 mg by mouth daily.    Marland Kitchen HAWTHORN PO Take 1 capsule by mouth daily.    Marland Kitchen ibuprofen (ADVIL) 800 MG tablet Take 400 mg by mouth every 6 (six) hours as needed for moderate pain.     .  Iodine, Kelp, (KELP PO) Take 1 capsule by mouth daily.    . lansoprazole (PREVACID) 15 MG capsule Take 15 mg by mouth daily.    Marland Kitchen LETAIRIS 10 MG tablet Take 1 tablet (10 mg total) by mouth daily. 30 tablet 11  . levonorgestrel (MIRENA) 20 MCG/24HR IUD 1 each by Intrauterine route once.    Marland Kitchen levothyroxine (SYNTHROID) 88 MCG tablet Take 1 tablet (88 mcg total) by mouth daily. 90 tablet 3  . Multiple Vitamins-Minerals (ECHINACEA ACZ PO) Take 1 tablet by mouth daily.    . Omega-3 Fatty Acids (FISH OIL) 1000 MG CAPS Take 1,000 mg by mouth daily.    . Selexipag (UPTRAVI PO) Take 2,400 mcg by mouth 2 (two) times daily.    . tadalafil, PAH, (ADCIRCA) 20 MG tablet Take 2 tablets (40 mg total) by mouth daily. 60 tablet 6  . VITAMIN E EX Apply 1 capsule topically daily.    Marland Kitchen zinc gluconate 50 MG tablet Take 50 mg by mouth daily.     No current facility-administered  medications for this encounter.    BP 102/60   Pulse 72   Wt 80.9 kg (178 lb 6.4 oz)   SpO2 95%   BMI 29.69 kg/m    Wt Readings from Last 3 Encounters:  01/07/21 80.9 kg (178 lb 6.4 oz)  12/20/20 77.1 kg (170 lb)  12/01/20 80.9 kg (178 lb 4 oz)  General: NAD Neck: No JVD, no thyromegaly or thyroid nodule.  Lungs: Clear to auscultation bilaterally with normal respiratory effort. CV: Nondisplaced PMI.  Heart regular S1/S2, no S3/S4, no murmur.  No peripheral edema.  No carotid bruit.  Normal pedal pulses.  Abdomen: Soft, nontender, no hepatosplenomegaly, no distention.  Skin: Intact without lesions or rashes.  Neurologic: Alert and oriented x 3.  Psych: Normal affect. Extremities: No clubbing or cyanosis.  HEENT: Normal.   Assessment/Plan:  1. Pulmonary hypertension/RV failure: Echo 11/21 with normal LV size with mild LV hypertrophy, EF 50-55%, D-shaped septum due to RV pressure/volume overload, severely dilated RV with moderately decreased RV systolic function, PASP 109 mmHg, dilated IVC. She had been symptomatic for months  prior to this, suspect due to gradually worsening RV failure. CTA chest did not show significant lung pathology and did not show PE and V/Q scan negative. PFTs normal.No diet/weight loss drugs.  HIV negative and rheumatologic serologies negative.  HCV antibody positive but liver normal on [place] and LFTs normal.  RHC in 11/21 showed severe pulmonary arterial hypertension with low cardiac output and normal filling pressures.  She will need aggressive treatment of PAH and given low output, may need IV therapy eventually.  Echo in 2/22 showed moderate RV dilation with moderate RV dysfunction, but PASP down to 53 mmHg (109 mmHg on prior echo).  Suspect primary pulmonary hypertension. Sleep study negative. She feels good, NYHA class I symptoms now, not volume overloaded on exam.  6 minute walk today > 500 m.  - Continue Lasix 40 mg daily.  BMET/BNP.     - Continue tadalafil 40 mg daily. - Continue digoxin 0.0625 daily for RV support, check level today.  - Continue ambrisentan 10 mg daily today.  She has IUD and gets monthly pregnancy tests. Serum pregnancy test today. - Selexipag now up to 2400 mcg bid.  2. Systemic HTN: Patient has had systemic HTN for some time. She has a strong family history of this. She thinks losartan and diltiazem caused facial swelling so has stopped them. She thinks metoprolol caused a rash. Concern for secondary HTN given young age (and family history).  Renal artery dopplers in 11/21 without evidence for hemodynamically significant stenosis.  Plasma renin and aldosterone ratio normal.  Serum normetanephrine elevated at 213 pg/mL with normal metanephrine.  This can be a false positive.  Since 24 hour urine catecholamines were negative, suspect not pheochromocytoma. BP now controlled.    - Continue amlodipine to 5 mg daily. 3. Hypothyroidism: Continue Levoxyl, followed by endocrinology.   4. 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 Says she has quit.  5. Hep C: HCV  ab reactive. H/o IVDU and has had prior exposure to HCV. Abdominal US without cirrhosis and LFTs normal.  HCV RNA was negative => suspect infection was cleared.  6. Fatigue/sleepiness: Negative sleep study but she did have evidence for nocturnal hypoxemia.  - Overnight oximetry.   Followup in 3 months.   Marca Ancona 01/09/2021

## 2021-01-18 ENCOUNTER — Other Ambulatory Visit: Payer: Self-pay

## 2021-01-18 ENCOUNTER — Ambulatory Visit (INDEPENDENT_AMBULATORY_CARE_PROVIDER_SITE_OTHER): Payer: Medicaid Other | Admitting: Licensed Clinical Social Worker

## 2021-01-18 DIAGNOSIS — F320 Major depressive disorder, single episode, mild: Secondary | ICD-10-CM | POA: Diagnosis not present

## 2021-01-18 DIAGNOSIS — F32A Depression, unspecified: Secondary | ICD-10-CM

## 2021-01-18 DIAGNOSIS — F411 Generalized anxiety disorder: Secondary | ICD-10-CM

## 2021-01-18 NOTE — Progress Notes (Signed)
   THERAPIST PROGRESS NOTE  Session Time: 14   Participation Level: Active  Behavioral Response: Casual and Fairly GroomedAlertAnxious and Depressed  Type of Therapy: Individual Therapy  Treatment Goals addressed: Diagnosis: MDD and GAD   Interventions: CBT and Supportive  Summary: Zoe Roach is a 30 y.o. female who presents with MDD and GAD .   Suicidal/Homicidal: NAwithout intent/plan  Therapist Response:    Subjective/Objective:  Pt was alert and oriented x 5. She was dressed casually and engaged well in therapy session. Pt presented with tearful and depressed mood/affect. She was cooperative and maintained good eye contact.   Primary stress for pt is work, family, and time management. She reports that she is down to working one overnight shift for 11 hours per week. She was working more but her patient passed away. 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 She reports that with more time on her hands she needs to start finding a better routine.  Intervention: Pt and LCSW used CBT, solution focused and supportive therapy as primary intervention in today's session. LCSW spoke on working on affirmation cards, planner/lists, and exercise. Pt will attempt yoga 3 x weekly and walk 2 x weekly for exercise. For Positive affirmations pt to utilize flash cards and write down 120 different affirmations, then pt to place them in a common area she goes in the morning she will rotate through 1 per day. For planning and list to was agreeable to get a planner and utilize it for the weekly plans.    Assessment: Pt endorses symptoms for tension, worry, irritability, tearfulness, over sleeping, and fluctuating appetite. Pt does meet criteria for MDD and GAD. She is taking her medications as prescribed. Mason will f/u in three weeks.   Plan: Return again in 4 weeks.     Weber Cooks, LCSW 01/18/2021

## 2021-01-19 ENCOUNTER — Other Ambulatory Visit (HOSPITAL_COMMUNITY): Payer: Self-pay | Admitting: Cardiology

## 2021-02-02 ENCOUNTER — Ambulatory Visit: Payer: Medicaid Other | Admitting: Internal Medicine

## 2021-02-09 ENCOUNTER — Ambulatory Visit (HOSPITAL_COMMUNITY): Payer: Medicaid Other | Admitting: Licensed Clinical Social Worker

## 2021-02-15 ENCOUNTER — Other Ambulatory Visit (HOSPITAL_COMMUNITY): Payer: Self-pay | Admitting: *Deleted

## 2021-02-15 ENCOUNTER — Other Ambulatory Visit (HOSPITAL_COMMUNITY): Payer: Self-pay | Admitting: Cardiology

## 2021-02-15 MED ORDER — FUROSEMIDE 20 MG PO TABS: 40.0000 mg | ORAL_TABLET | Freq: Two times a day (BID) | ORAL | 3 refills | Status: DC

## 2021-03-01 ENCOUNTER — Other Ambulatory Visit: Payer: Self-pay

## 2021-03-01 ENCOUNTER — Ambulatory Visit (INDEPENDENT_AMBULATORY_CARE_PROVIDER_SITE_OTHER): Payer: Medicaid Other | Admitting: Licensed Clinical Social Worker

## 2021-03-01 DIAGNOSIS — F411 Generalized anxiety disorder: Secondary | ICD-10-CM

## 2021-03-01 DIAGNOSIS — F32A Depression, unspecified: Secondary | ICD-10-CM

## 2021-03-01 DIAGNOSIS — F320 Major depressive disorder, single episode, mild: Secondary | ICD-10-CM

## 2021-03-01 NOTE — Progress Notes (Signed)
   THERAPIST PROGRESS NOTE  Session Time: 83  Participation Level: Active  Behavioral Response: CasualAlertDepressed  Type of Therapy: Individual Therapy  Treatment Goals addressed: Diagnosis: depression  Interventions: CBT and Supportive  Summary: Zoe Roach is a 30 y.o. female who presents with tearful and depressed mood/affect.  Patient was alert and oriented x5.  She was cooperative and maintained good eye contact and pleasant in session.    Patient's primary stressor is housing, family conflict, and relationship.  Patient reports that her housing is on hold right now due to the housing market.  She is currently living with her mother who she feels indebted to.  The reason patient feels indebted at this time is because her mother took care of her son when she was in rehab for drug and alcohol use.  Patient reports that things have been tough as she does not feel like there is any escape and she does not live up to her mother's expectations.  Patient admits to putting unrealistic expectations on her mother.  Other stressors for patient include relationship.  Spouse is currently in rehab.  He is going to be coming home soon from a 54-month rehabilitation program.  This is the same program that patient graduated from 2 years ago.  Patient is still unsure at this time if she wants her spouse to return to her home.  Patient admits to great physical contact with her spouse, but states that at times she wishes that her relationship could be part-time with them.  Patient and spouse will be graduating from program in 6 weeks this at tents stressed to patient as she needs to determine if spouse can return home after graduation  Suicidal/Homicidal: NAwithout intent/plan  Therapist Response:    Intervention/Plan: Therapy used in today's session was supportive, CBT, and existential therapy.  Positive regard, praise, and encouragement were utilized by LCSW in session.  Existential therapy was  utilized for identifying problems and identifying goals in session as evidenced by patient stating that she wants to do yoga 1 time per week.  Plan for patient moving forward is to do yoga 1 time per week and track on a calendar her moods on days that she does her yoga sessions.  Patient also to make a pros\cons list for reasons she wants her husband to return to the house versus not return to the house.  Plan: Return again in 4  weeks.     Weber Cooks, LCSW 03/01/2021

## 2021-04-05 ENCOUNTER — Ambulatory Visit (INDEPENDENT_AMBULATORY_CARE_PROVIDER_SITE_OTHER): Payer: Medicaid Other | Admitting: Licensed Clinical Social Worker

## 2021-04-05 ENCOUNTER — Other Ambulatory Visit: Payer: Self-pay

## 2021-04-05 DIAGNOSIS — F320 Major depressive disorder, single episode, mild: Secondary | ICD-10-CM

## 2021-04-05 DIAGNOSIS — F411 Generalized anxiety disorder: Secondary | ICD-10-CM

## 2021-04-05 DIAGNOSIS — F32A Depression, unspecified: Secondary | ICD-10-CM

## 2021-04-05 NOTE — Progress Notes (Signed)
   THERAPIST PROGRESS NOTE  Session Time: 74   Participation Level: Active  Behavioral Response: CasualAlertAnxious and Depressed  Type of Therapy: Individual Therapy  Treatment Goals addressed: Diagnosis: depression and anxiety   Interventions: CBT and Supportive  Summary: Zoe Roach is a 30 y.o. female who presents with depressed and anxious mood\affect.  She was alert and oriented x5.  Patient was cooperative, pleasant, and maintained good eye contact.  Primary stressor for today is family conflict and relationship.  She reports that her husband will be coming home from recovery housing next month after 7 months in his program.  She reports that she is still torn with her decision to let him moved back in with her, her son, and her mother.  Patient reports that she is concerned that old habits will surface if she just allows him to move back into the home.  Patient wants to take things slow and have separate housing for the first 3 to 6 months.[name] reports that she has not been able to tell her husband this and it is causing her stress, tension, and restlessness.  Suicidal/Homicidal: NAwithout intent/plan  Therapist Response:    Intervention/Plan: Plan for patient is to follow-up with LCSW in 3 weeks this will align with when her spouse is supposed to be discharged from his alcohol and other drug program. [name] reports that she did do a pro\cons list about staying with her spouse.  Patient reports that there were more pros then cons.  Next step will be to rehearse in the mirror or with her mother how she wants to present her plan with her spouse.  LCSW utilized open-ended questions.  LCSW utilized unconditional positive regard.  LCSW utilized support as evidenced by using encouragement, praise, and advice giving.  Plan: Return again in 4  weeks.      Weber Cooks, LCSW 04/05/2021

## 2021-04-26 ENCOUNTER — Ambulatory Visit (HOSPITAL_COMMUNITY): Payer: Medicaid Other | Admitting: Licensed Clinical Social Worker

## 2021-04-28 ENCOUNTER — Encounter (HOSPITAL_COMMUNITY): Payer: Medicaid Other | Admitting: Cardiology

## 2021-04-29 ENCOUNTER — Telehealth (HOSPITAL_COMMUNITY): Payer: Self-pay | Admitting: Licensed Clinical Social Worker

## 2021-04-29 ENCOUNTER — Other Ambulatory Visit (HOSPITAL_COMMUNITY): Payer: Self-pay

## 2021-04-29 MED ORDER — FUROSEMIDE 40 MG PO TABS: 40.0000 mg | ORAL_TABLET | Freq: Two times a day (BID) | ORAL | 0 refills | Status: DC

## 2021-04-29 NOTE — Telephone Encounter (Signed)
CSW received VM from pt that she needs a refill on furosemide- sent message to RN staff to request.  Called pt and informed once script had been sent in  Will continue to follow and assist as needed  Burna Sis, LCSW Clinical Social Worker [facility] Desk#: [phone] Cell#: [phone]

## 2021-05-17 ENCOUNTER — Ambulatory Visit (HOSPITAL_COMMUNITY): Payer: Medicaid Other | Admitting: Licensed Clinical Social Worker

## 2021-05-19 ENCOUNTER — Telehealth (HOSPITAL_COMMUNITY): Payer: Self-pay | Admitting: *Deleted

## 2021-05-19 NOTE — Telephone Encounter (Signed)
Pt called c/o headaches, body aches, nausea pt said she thinks her uptravi dose is too high. Pt asked if these were common side effects.   Routed to Dr.McLean

## 2021-05-20 NOTE — Telephone Encounter (Signed)
Pt aware.

## 2021-05-20 NOTE — Telephone Encounter (Signed)
She can decrease her Uptravi dose one level.  Have the nurse that has been helping with titration do this.

## 2021-05-26 ENCOUNTER — Other Ambulatory Visit (HOSPITAL_COMMUNITY): Payer: Self-pay | Admitting: Cardiology

## 2021-05-26 NOTE — Telephone Encounter (Signed)
Down titration ordes called into [facility] to assist with finding her maintain dose is reached  per protocol-will decrease by 200 mg weekly until HA resolve per protocol. Once new goal is reached will reach back out to provider to get new dose script or orders to increase again

## 2021-05-31 NOTE — Telephone Encounter (Signed)
Zoe Roach updated to assist with down titration while patient is having HA"s. Once HA's resolve will begin up titration

## 2021-06-01 ENCOUNTER — Telehealth (HOSPITAL_COMMUNITY): Payer: Self-pay | Admitting: Pharmacist

## 2021-06-01 NOTE — Telephone Encounter (Signed)
Patient Advocate Encounter   Received notification from [facility] Medicaid that prior authorization for Cordie Grice is required.   PA submitted on CoverMyMeds Key [id number] Status is pending   Will continue to follow.   Karle Plumber, PharmD, BCPS, BCCP, CPP [facility] Pharmacist [phone]

## 2021-06-02 NOTE — Telephone Encounter (Signed)
[facility] Patient Advocate Encounter  Prior Authorization for Cordie Grice has been approved.    PA# [id number] Effective dates: 06/01/21 through 06/01/22  Karle Plumber, PharmD, BCPS, BCCP, CPP [facility] Pharmacist [phone]

## 2021-06-03 ENCOUNTER — Encounter: Payer: Self-pay | Admitting: Internal Medicine

## 2021-06-03 ENCOUNTER — Other Ambulatory Visit: Payer: Self-pay

## 2021-06-03 ENCOUNTER — Ambulatory Visit (INDEPENDENT_AMBULATORY_CARE_PROVIDER_SITE_OTHER): Payer: Medicaid Other | Admitting: Internal Medicine

## 2021-06-03 VITALS — BP 126/72 | HR 84 | Ht 65.0 in | Wt 169.0 lb

## 2021-06-03 DIAGNOSIS — E039 Hypothyroidism, unspecified: Secondary | ICD-10-CM

## 2021-06-03 LAB — TSH: TSH: 12.7 mIU/L — ABNORMAL HIGH

## 2021-06-03 LAB — T4, FREE: Free T4: 1.2 ng/dL (ref 0.8–1.8)

## 2021-06-03 NOTE — Patient Instructions (Signed)

## 2021-06-03 NOTE — Progress Notes (Signed)
Name: Zoe Roach  MRN/ DOB: [id number], 09-15-1990    Age/ Sex: 30 y.o., female     PCP: Leilani Able, MD   Reason for Endocrinology Evaluation: Hypothyroidism     Initial Endocrinology Clinic Visit: 08/25/2020    PATIENT IDENTIFIER: Zoe Roach is a 30 y.o., female with a past medical history of HTN , IV drug abuse, Pulmonary HTN and RV failure. She has followed with [facility] since 08/25/2020 for consultative assistance with management of her Elevated catecholamines   HISTORICAL SUMMARY:   During evaluation for HTN/ Pulmonary hypertension and RV failure she was noted to have a slightly elevated (06/2020)  plasma  normetanephrine's 213.9 with normal plasma metanephrine.  Urinary dopamine, epinephrines, metanephrines and normetanephrines have all come back normal with slight elevation of norepinephrine at 141  ug ( reference 0-135 )   Aldo and renin are normal at 2.8 and 1.031 respectively.  Repeat 24-hr urine catecholamine and cortisol were all normal by 08/2020     THYROID HISTORY:   She has been diagnosed with hypothyroidism in the summer of 2021 , she was started  On  levothyroxine at the time.   SUBJECTIVE:    Today (06/03/2021):  Zoe Roach is here for hypothyroidism.  Weight has decreased  Does not sleep well at night  Denies constipation or diarrhea   Denies local neck symptoms  Denies local neck swelling  Denies palpitations   She continues to follow-up with cardiology for pulmonary hypertension She was having headaches due to Uptravi, headaches have improved with reduction of the dose She denies any shortness of breath or coughing at this time  Levothyroxine 88 mcg daily     HISTORY:  Past Medical History:  Past Medical History:  Diagnosis Date   CHF (congestive heart failure) (HCC)    History of COVID-19    Hypothyroidism (acquired)    IVDU (intravenous drug user)    SOB (shortness of breath)    Past Surgical  History:  Past Surgical History:  Procedure Laterality Date   CESAREAN SECTION     RIGHT HEART CATH N/A 06/21/2020   Procedure: RIGHT HEART CATH;  Surgeon: Laurey Morale, MD;  Location: [facility] INVASIVE CV LAB;  Service: Cardiovascular;  Laterality: N/A;   Social History:  reports that she has been smoking cigarettes. She has a 7.50 pack-year smoking history. She has never used smokeless tobacco. She reports current alcohol use. She reports current drug use. Family History:  Family History  Problem Relation Age of Onset   Hypertension Paternal Grandfather    Hypertension Mother      HOME MEDICATIONS: Allergies as of 06/03/2021       Reactions   Almond (diagnostic) Hives   Cherry Hives   Diltiazem Other (See Comments)   Swollen face   Losartan Swelling   Peach [prunus Persica] Hives   Metoprolol Rash        Medication List        Accurate as of June 03, 2021  4:40 PM. If you have any questions, ask your nurse or doctor.          ALPRAZolam 0.5 MG 24 hr tablet Commonly known as: XANAX XR Take 0.5 mg by mouth in the morning, at noon, and at bedtime.   amLODipine 5 MG tablet Commonly known as: NORVASC Take 1 tablet (5 mg total) by mouth daily.   BLACK CURRANT SEED OIL PO Take 1 capsule by mouth daily.   cetirizine 10  MG tablet Commonly known as: ZYRTEC Take 10 mg by mouth daily.   co-enzyme Q-10 30 MG capsule Take 30 mg by mouth daily.   digoxin 0.125 MG tablet Commonly known as: LANOXIN Take 0.5 tablets (0.0625 mg total) by mouth daily.   ECHINACEA ACZ PO Take 1 tablet by mouth daily.   escitalopram 20 MG tablet Commonly known as: LEXAPRO Take 20 mg by mouth daily.   Fish Oil 1000 MG Caps Take 1,000 mg by mouth daily.   furosemide 40 MG tablet Commonly known as: LASIX Take 1 tablet (40 mg total) by mouth 2 (two) times daily. Need to make appointment for further refills   HAWTHORN PO Take 1 capsule by mouth daily.   ibuprofen 800 MG  tablet Commonly known as: ADVIL Take 400 mg by mouth every 6 (six) hours as needed for moderate pain.   KELP PO Take 1 capsule by mouth daily.   lansoprazole 15 MG capsule Commonly known as: PREVACID Take 15 mg by mouth daily.   Letairis 10 MG tablet Generic drug: ambrisentan Take 1 tablet (10 mg total) by mouth daily.   levonorgestrel 20 MCG/24HR IUD Commonly known as: MIRENA 1 each by Intrauterine route once.   levothyroxine 88 MCG tablet Commonly known as: SYNTHROID Take 1 tablet (88 mcg total) by mouth daily.   tadalafil (PAH) 20 MG tablet Commonly known as: ADCIRCA TAKE 2 TABLETS BY MOUTH EVERY DAY   UPTRAVI PO Take 2,400 mcg by mouth 2 (two) times daily.   VITAMIN B COMPLEX PO Take 1 capsule by mouth daily.   vitamin C 1000 MG tablet Take 1,000 mg by mouth daily.   VITAMIN E EX Apply 1 capsule topically daily.   zinc gluconate 50 MG tablet Take 50 mg by mouth daily.          OBJECTIVE:   PHYSICAL EXAM: VS: BP 126/72 (BP Location: Left Arm, Patient Position: Sitting, Cuff Size: Small)   Pulse 84   Ht 5\' 5"  (1.651 m)   Wt 169 lb (76.7 kg)   SpO2 96%   BMI 28.12 kg/m    EXAM: General: Pt appears well and is in NAD  Neck: General: Supple without adenopathy. Thyroid: Thyroid size normal.  No goiter or nodules appreciated. No thyroid bruit.  Lungs: Clear with good BS bilat with no rales, rhonchi, or wheezes  Heart: Auscultation: RRR.  Abdomen: Normoactive bowel sounds, soft, nontender, without masses or organomegaly palpable  Extremities:  BL LE: No pretibial edema normal ROM and strength.     DATA REVIEWED:  Results for RAYLEA, ADCOX (MRN Vicki Mallet) as of 06/06/2021 12:16  Ref. Range 06/03/2021 15:48  TSH Latest Units: mIU/L 12.70 (H)  T4,Free(Direct) Latest Ref Range: 0.8 - 1.8 ng/dL 1.2   ASSESSMENT / PLAN / RECOMMENDATIONS:  Hypothyroidism :    - Pt with fatigue  - No local neck symptoms  -Repeat TSH elevated at 12.70 U  IU/mL, will increase levothyroxine dose as below     Medications :   Stop levothyroxine 88 MCG daily    Start levothyroxine 100 MCG daily   F/U in 6 months     Signed electronically by: 06/05/2021, MD  [facility]  [facility] [address] [place] [place], [place] Phone: [phone] FAX: [phone]      CC: [phone], MD [address] [place] Phone: [phone]  Fax: [phone]   Return to Endocrinology clinic as below: Future Appointments  Date Time Provider Department Center  07/01/2021  9:20 AM Laurey Morale, MD [facility] None  12/02/2021  9:10 AM Armando Bukhari, Konrad Dolores, MD [facility] None

## 2021-06-06 ENCOUNTER — Other Ambulatory Visit (HOSPITAL_COMMUNITY): Payer: Self-pay | Admitting: Cardiology

## 2021-06-06 ENCOUNTER — Telehealth (HOSPITAL_COMMUNITY): Payer: Self-pay | Admitting: Pharmacist

## 2021-06-06 MED ORDER — LEVOTHYROXINE SODIUM 100 MCG PO TABS: 100.0000 ug | ORAL_TABLET | Freq: Every day | ORAL | 1 refills | Status: DC

## 2021-06-06 NOTE — Telephone Encounter (Signed)
Patient Advocate Encounter   Received notification from [facility] that prior authorization for [name] is required.   PA submitted on CoverMyMeds Key [id number] Status is pending   Will continue to follow.   Karle Plumber, PharmD, BCPS, BCCP, CPP Heart Failure Clinic Pharmacist [phone]

## 2021-06-07 NOTE — Telephone Encounter (Signed)
Advanced Heart Failure Patient Advocate Encounter  Prior Authorization for Zoe Roach has been approved.    PA# [id number] Effective dates: 06/06/21 through 06/06/22  Archer Asa, CPhT

## 2021-07-01 ENCOUNTER — Encounter (HOSPITAL_COMMUNITY): Payer: Self-pay | Admitting: Cardiology

## 2021-07-01 ENCOUNTER — Other Ambulatory Visit: Payer: Self-pay

## 2021-07-01 ENCOUNTER — Ambulatory Visit (HOSPITAL_COMMUNITY)
Admission: RE | Admit: 2021-07-01 | Discharge: 2021-07-01 | Disposition: A | Discharge status: Home / Self Care | Payer: Medicaid Other | Source: Ambulatory Visit | Attending: Cardiology | Admitting: Cardiology

## 2021-07-01 VITALS — BP 110/60 | HR 77 | Wt 169.6 lb

## 2021-07-01 DIAGNOSIS — Z7901 Long term (current) use of anticoagulants: Secondary | ICD-10-CM | POA: Insufficient documentation

## 2021-07-01 DIAGNOSIS — Z09 Encounter for follow-up examination after completed treatment for conditions other than malignant neoplasm: Secondary | ICD-10-CM | POA: Diagnosis not present

## 2021-07-01 DIAGNOSIS — R400 Somnolence: Secondary | ICD-10-CM | POA: Insufficient documentation

## 2021-07-01 DIAGNOSIS — I10 Essential (primary) hypertension: Secondary | ICD-10-CM

## 2021-07-01 DIAGNOSIS — B1920 Unspecified viral hepatitis C without hepatic coma: Secondary | ICD-10-CM | POA: Diagnosis not present

## 2021-07-01 DIAGNOSIS — I272 Pulmonary hypertension, unspecified: Secondary | ICD-10-CM | POA: Diagnosis not present

## 2021-07-01 DIAGNOSIS — I110 Hypertensive heart disease with heart failure: Secondary | ICD-10-CM | POA: Diagnosis not present

## 2021-07-01 DIAGNOSIS — Z79899 Other long term (current) drug therapy: Secondary | ICD-10-CM | POA: Diagnosis not present

## 2021-07-01 DIAGNOSIS — R519 Headache, unspecified: Secondary | ICD-10-CM | POA: Diagnosis present

## 2021-07-01 DIAGNOSIS — F17200 Nicotine dependence, unspecified, uncomplicated: Secondary | ICD-10-CM | POA: Insufficient documentation

## 2021-07-01 DIAGNOSIS — F1111 Opioid abuse, in remission: Secondary | ICD-10-CM | POA: Insufficient documentation

## 2021-07-01 DIAGNOSIS — R5383 Other fatigue: Secondary | ICD-10-CM | POA: Diagnosis not present

## 2021-07-01 DIAGNOSIS — Z8616 Personal history of COVID-19: Secondary | ICD-10-CM | POA: Insufficient documentation

## 2021-07-01 DIAGNOSIS — E039 Hypothyroidism, unspecified: Secondary | ICD-10-CM | POA: Diagnosis not present

## 2021-07-01 DIAGNOSIS — I2721 Secondary pulmonary arterial hypertension: Secondary | ICD-10-CM | POA: Diagnosis not present

## 2021-07-01 DIAGNOSIS — G8929 Other chronic pain: Secondary | ICD-10-CM | POA: Insufficient documentation

## 2021-07-01 LAB — BASIC METABOLIC PANEL
Anion gap: 8 (ref 5–15)
BUN: 16 mg/dL (ref 6–20)
CO2: 23 mmol/L (ref 22–32)
Calcium: 9.4 mg/dL (ref 8.9–10.3)
Chloride: 107 mmol/L (ref 98–111)
Creatinine, Ser: 1.09 mg/dL — ABNORMAL HIGH (ref 0.44–1.00)
GFR, Estimated: >60 mL/min (ref >60)
Glucose, Bld: 89 mg/dL (ref 70–99)
Potassium: 4.1 mmol/L (ref 3.5–5.1)
Sodium: 138 mmol/L (ref 135–145)

## 2021-07-01 LAB — BRAIN NATRIURETIC PEPTIDE: B Natriuretic Peptide: 37.5 pg/mL (ref 0.0–100.0)

## 2021-07-01 LAB — DIGOXIN LEVEL: Digoxin Level: 0.4 ng/mL — ABNORMAL LOW (ref 0.8–2.0)

## 2021-07-01 NOTE — Progress Notes (Signed)
6 Min Walk Test Completed  Pt ambulated 548.6 meters O2 Sat ranged 94%-98% on room air HR ranged 74-108

## 2021-07-01 NOTE — Patient Instructions (Signed)
Stop taking Goody's powder and take Tylenol 650mg  as needed instead.  Labs done today, your results will be available in MyChart, we will contact you for abnormal readings.  Your physician recommends that you schedule a follow-up appointment in: 3 months.  If you have any questions or concerns before your next appointment please send a message through [place] or call our office at [phone].    TO LEAVE A MESSAGE FOR THE NURSE SELECT OPTION 2, PLEASE LEAVE A MESSAGE INCLUDING: YOUR NAME DATE OF BIRTH CALL BACK NUMBER REASON FOR CALL**this is important as we prioritize the call backs  YOU WILL RECEIVE A CALL BACK THE SAME DAY AS LONG AS YOU CALL BEFORE 4:00 PM  At the [facility], you and your health needs are our priority. As part of our continuing mission to provide you with exceptional heart care, we have created designated Provider Care Teams. These Care Teams include your primary Cardiologist (physician) and Advanced Practice Providers (APPs- Physician Assistants and Nurse Practitioners) who all work together to provide you with the care you need, when you need it.   You may see any of the following providers on your designated Care Team at your next follow up: Dr [phone] Dr Arvilla Meres, NP Carron Curie, Robbie Lis [facility] [place], [place], PharmD   Please be sure to bring in all your medications bottles to every appointment.

## 2021-07-03 NOTE — Progress Notes (Signed)
PCP: Leilani Able, MD Cardiology: Dr. Shirlee Latch  30 y.o. with history of pulmonary hypertension and systemic hypertension presents for hospital followup after recent admission with RV failure. Patient also has prior history of IV drug use and COVID-19 infection roughly 1 year ago.  She was referred to general cardiology in 11/21 for evaluation of hypertension and abnormal EKG with findings consistent with right ventricular hypertrophy and RV strain.  She was initially placed on losartan for systemic hypertension but this was discontinued due to facial swelling concerning for angioedema. She was tried on diltiazem CD but also had trouble with this medication as well as Toprol XL.  She also complained of persistent dyspnea that had been present over the last year, following her COVID-19 infection.  Dyspnea gradually worsened over time to the point where it was present with minimal exertion.  Echo was done at the [facility] office in 11/21, showing EF 50-55%, mild LVH, D-shaped septum, severe RV enlargement, moderately decreased RV systolic function, moderate-severe TR, PASP 109, severe RAE.  Based on these findings, she was admitted to [facility] for evaluation of RV failure.    She was volume overloaded on exam and was diuresed with IV Lasix with significant improvement in her breathing.  RHC showed severe pulmonary arterial hypertension and low cardiac output.  V/Q scan and CTA chest were not suggestive of acute or chronic PE, PFTs were normal. Rheumatologic serologies and HIV were negative.  She was started on ambrisentan and tadalafil in the hospital.   In terms of her systemic HTN, were were concerned about secondary causes given her young age.  Renal artery dopplers did not show significant stenosis (worried about FMD), renin/aldosterone ratio was normal.  Plasma normetanephrine was elevated but 24 hour urine catecholamines were normal. She saw an endocrinologist and is not thought to have pheochromocytoma.    HCV antibody was normal but RNA not detected.  Abdominal US showed normal liver and LFTs were normal. She has had exposure to HCV in the past and used to use IV drugs.   She had an urticarial rash in the hospital of uncertain etiology (was present at admission), she got Solumedrol and this has resolved.   Echo 2/22 showed EF 60-65%, moderate RV dilation with moderately decreased systolic function, D-shaped septum, PASP 53 mmHg (down from 109 mmHg on last echo), normal IVC.   Due to headache and body aches, she had to decrease Uptravi to 2000 mcg bid.    Here today for follow up of pulmonary hypertension. She can tolerate Uptravi at 2000 mcg bid but unable to take higher dose.  Still with mild headache after Uptravi, uses Goody's powders.  She feels like her energy level is good.  No significant exertional dyspnea.  No chest pain.  No lightheadedness.  Able to walk about 5 miles at the [facility].   ECG (personally reviewed): NSR, RVH  6 minute walk (11/21): 427 meters 6 minute walk (12/21): 396.2 meters 6 minute walk (5/22): 518 meters 6 minute walk (11/22)" 549 meters  Labs (11/21): K 5, creatinine 1.16, RF mildly elevated 14, anti-CCP negative, HCV ab+ but RNA not detected, EBV IgG+ but IgM-, LFTs normal, TSH 22 with free T3 and free T4 normal, anti-SCL70 negative, anti-centromere negative, plasma normetanephrine elevated but 24 hour urine catecholeamines normal, PRA/PAC normal, ANA negative, HIV negative, BNP 905.  Labs (12/21): K 3.7, creatinine 1.0, Digoxin 0.6, BNP 40, serum pregnancy test: negative Labs (4/22): K 3.6, creatinine 1.02 Labs (5/22): digoxin 0.3,  BNP 32, K 3.6, creatinine 1.15  PMH: 1. HTN - Renal artery dopplers without hemodynamically significant stenosis.  - Serum normetanephrine elevated but 24 hour urine catecholamines normal => saw endocrinologist, not thought to have pheochromocytoma.  - PRA/PAC ration normal.  2. Hypothyroidism 3. Prior h/o IVDU 4. Active  smoker 5. Pulmonary hypertension: Suspect primary pulmonary hypertension.  - RHC (11/21): mean RA 6, PA 104/56 mean 74, mean PCWP 12, CI 1.41, PVR 23 - Echo (11/21): EF 50-55%, mild LVH, D-shaped septum, severe RV enlargement, moderately decreased RV systolic function, moderate-severe TR, PASP 109, severe RAE.  - V/Q scan (11/21) negative - CTA chest (11/21): No PE or ILD.  - PFTs (11/21): normal - Rheumatologic serologic workup negative, LFTs normal, HIV negative.  - Echo (2/22): EF 60-65%, moderate RV dilation with moderately decreased systolic function (RV EF 36%), D-shaped septum, PASP 53 mmHg (down from 109 mmHg on last echo), normal IVC.  - Negative sleep study.  6. HCV antibody positive: RNA not detected.  Abdominal US showed normal liver.  7. COVID-19 infection 2020  SH: Married, current smoker, prior IVDU. Occasional ETOH.   Family History  Problem Relation Age of Onset   Hypertension Paternal Grandfather    Hypertension Mother    ROS: All systems reviewed and negative except as per HPI.   Current Outpatient Medications  Medication Sig Dispense Refill   ALPRAZolam (XANAX) 0.5 MG tablet Take 0.5 mg by mouth daily.     amLODipine (NORVASC) 5 MG tablet Take 1 tablet (5 mg total) by mouth daily. 30 tablet 11   ASPIRIN 81 PO Take 81 mg by mouth daily.     digoxin (LANOXIN) 0.125 MG tablet Take 0.5 tablets (0.0625 mg total) by mouth daily. 45 tablet 3   furosemide (LASIX) 40 MG tablet Take 40 mg by mouth daily.     ibuprofen (ADVIL) 800 MG tablet Take 400 mg by mouth every 6 (six) hours as needed for moderate pain.      lansoprazole (PREVACID) 15 MG capsule Take 15 mg by mouth daily.     LETAIRIS 10 MG tablet TAKE 1 TABLET DAILY 30 tablet 11   levonorgestrel (MIRENA) 20 MCG/24HR IUD 1 each by Intrauterine route once.     levothyroxine (SYNTHROID) 100 MCG tablet Take 1 tablet (100 mcg total) by mouth daily before breakfast. 90 tablet 1   Selexipag (UPTRAVI PO) Take 2,000 mcg by  mouth 2 (two) times daily.     tadalafil, PAH, (ADCIRCA) 20 MG tablet TAKE 2 TABLETS BY MOUTH EVERY DAY 60 tablet 11   No current facility-administered medications for this encounter.    BP 110/60   Pulse 77   Wt 76.9 kg (169 lb 9.6 oz)   SpO2 94%   BMI 28.22 kg/m    Wt Readings from Last 3 Encounters:  07/01/21 76.9 kg (169 lb 9.6 oz)  06/03/21 76.7 kg (169 lb)  01/07/21 80.9 kg (178 lb 6.4 oz)  General: NAD Neck: No JVD, no thyromegaly or thyroid nodule.  Lungs: Clear to auscultation bilaterally with normal respiratory effort. CV: Nondisplaced PMI.  Heart regular S1/S2, no S3/S4, no murmur.  No peripheral edema.  No carotid bruit.  Normal pedal pulses.  Abdomen: Soft, nontender, no hepatosplenomegaly, no distention.  Skin: Intact without lesions or rashes.  Neurologic: Alert and oriented x 3.  Psych: Normal affect. Extremities: No clubbing or cyanosis.  HEENT: Normal.   Assessment/Plan:  1. Pulmonary hypertension/RV failure: Echo 11/21 with normal LV size with  mild LV hypertrophy, EF 50-55%,  D-shaped septum due to RV pressure/volume overload, severely dilated RV with moderately decreased RV systolic function, PASP 109 mmHg, dilated IVC. She had been symptomatic for months prior to this, suspect due to gradually worsening RV failure.  CTA chest did not show significant lung pathology and did not show PE and V/Q scan negative. PFTs normal. No diet/weight loss drugs.  HIV negative and rheumatologic serologies negative.  HCV antibody positive but liver normal on [place] and LFTs normal.  RHC in 11/21 showed severe pulmonary arterial hypertension with low cardiac output and normal filling pressures.  She will need aggressive treatment of PAH and given low output, may need IV therapy eventually.  Echo in 2/22 showed moderate RV dilation with moderate RV dysfunction, but PASP down to 53 mmHg (109 mmHg on prior echo).  Suspect primary pulmonary hypertension. Sleep study negative. She feels good,  NYHA class I symptoms now, not volume overloaded on exam.  6 minute walk today again > 500 m.  - Continue Lasix 40 mg daily.  BMET/BNP.     - Continue tadalafil 40 mg daily. - Continue digoxin 0.0625 daily for RV support, check level today.  - Continue ambrisentan 10 mg daily today.  She has IUD and gets monthly pregnancy tests.  - Selexipag 2000 mcg bid.  Unable to take higher dose due to headaches.  I asked her to stop Goody's powders and to use Tylenol instead.  - Repeat echo in 2/23.  2. Systemic HTN: Patient has had systemic HTN for some time.  She has a strong family history of this. She thinks losartan and diltiazem caused facial swelling so has stopped them.  She thinks metoprolol caused a rash.  Concern for secondary HTN given young age (and family history).  Renal artery dopplers in 11/21 without evidence for hemodynamically significant stenosis.  Plasma renin and aldosterone ratio normal.  Serum normetanephrine elevated at 213 pg/mL with normal metanephrine.  This can be a false positive.  Since 24 hour urine catecholamines were negative, suspect not pheochromocytoma. BP now controlled.    - Continue amlodipine to 5 mg daily. 3. Hypothyroidism: Continue Levoxyl, followed by endocrinology.   4. 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 Says she has quit.  5. Hep C: HCV ab reactive. H/o IVDU and has had prior exposure to HCV. Abdominal US without cirrhosis and LFTs normal.  HCV RNA was negative => suspect infection was cleared.  6. Fatigue/sleepiness: Negative sleep study.  Followup in 3 months.   Marca Ancona 07/03/2021

## 2021-07-15 ENCOUNTER — Other Ambulatory Visit (HOSPITAL_COMMUNITY): Payer: Self-pay | Admitting: Cardiology

## 2021-07-25 ENCOUNTER — Telehealth (HOSPITAL_COMMUNITY): Payer: Self-pay

## 2021-07-25 ENCOUNTER — Other Ambulatory Visit (HOSPITAL_COMMUNITY): Payer: Self-pay | Admitting: Cardiology

## 2021-07-25 NOTE — Telephone Encounter (Signed)
Would make sure she does not have flu or COVID as both are command and can cause those symptoms.  If not, can decrease Selexipag to 1600 mcg bid.

## 2021-07-25 NOTE — Telephone Encounter (Signed)
Patient called to report that she is currently on Uptravi bid and has been experiencing exteremly bad headaches and body aches for the last   2 weeks and reports that they have gradually gotten worse. She reports that when she gets up around 6:30am to take her first dose she has to stay in bed until about 12:30pm-1pm due to the side effects. Patient wants to know if she can lower her dose or stop the medication completely. Please advise

## 2021-07-26 NOTE — Telephone Encounter (Signed)
Pt aware and denies flu or covid and wants to decrease medication.

## 2021-07-27 ENCOUNTER — Other Ambulatory Visit (HOSPITAL_COMMUNITY): Payer: Self-pay | Admitting: Cardiology

## 2021-07-27 IMAGING — CR DG CHEST 1V PORT
1 series · 1 of 1 positions shown · non-contrast
Comparison: 06/11/2020 chest radiograph.

CLINICAL DATA: Dyspnea

EXAM:
PORTABLE CHEST 1 VIEW

[chest pa]
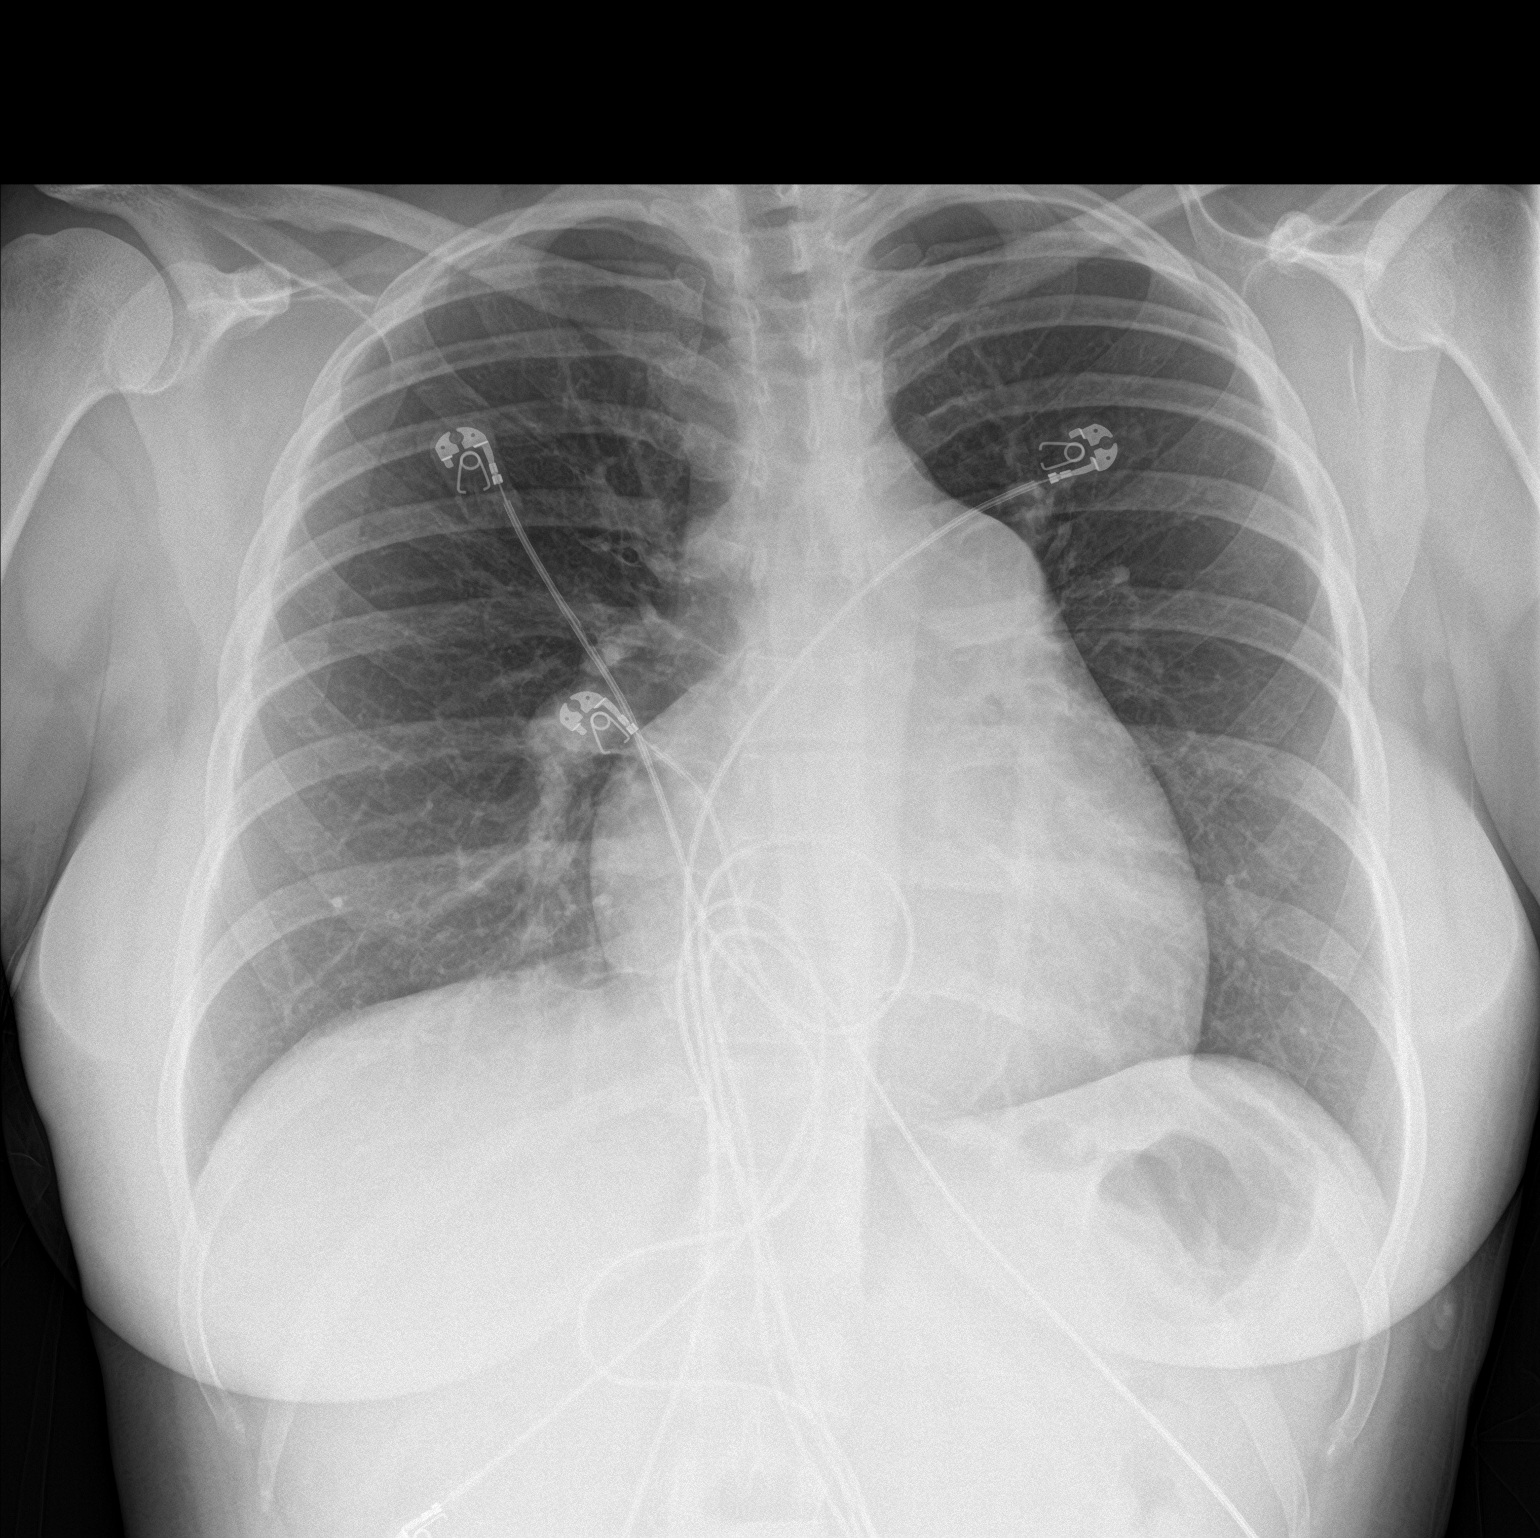

[1 of 1 positions shown; findings below may reference images not displayed]

FINDINGS: Stable cardiomediastinal silhouette with top-normal heart size and
prominent main pulmonary artery contour. No pneumothorax. No pleural
effusion. Lungs appear clear, with no acute consolidative airspace
disease and no pulmonary edema.
IMPRESSION: Clear lungs.

Stable prominent main pulmonary artery contour, correlating with
dilated main pulmonary artery is seen on recent chest CT angiogram
study.

## 2021-07-27 MED ORDER — UPTRAVI 200 MCG PO TABS: 400.0000 ug | ORAL_TABLET | Freq: Two times a day (BID) | ORAL | 3 refills | Status: DC

## 2021-07-27 MED ORDER — UPTRAVI 1200 MCG PO TABS: 1200.0000 ug | ORAL_TABLET | Freq: Two times a day (BID) | ORAL | 3 refills | Status: DC

## 2021-07-30 IMAGING — US US ABDOMEN LIMITED
1 series · 14 of 25 positions shown · non-contrast
Comparison: 06/16/2020

CLINICAL DATA: Hepatitis C

EXAM:
ULTRASOUND ABDOMEN LIMITED RIGHT UPPER QUADRANT

[Series 1: us abdomen limited ruq (liver/gb) · 14 of 37 slices shown]
[im 1/37]
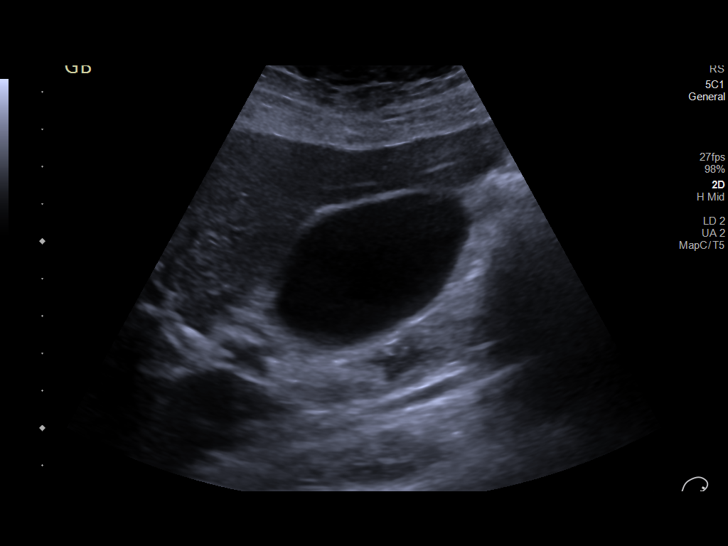
[im 4/37]
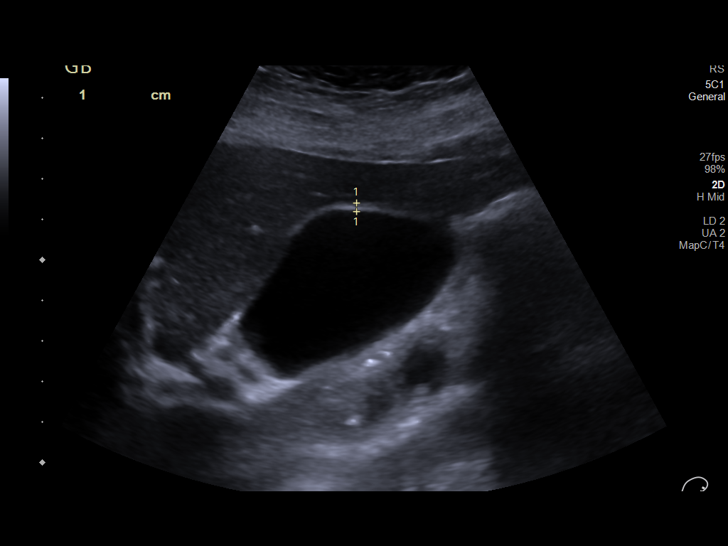
[im 7/37]
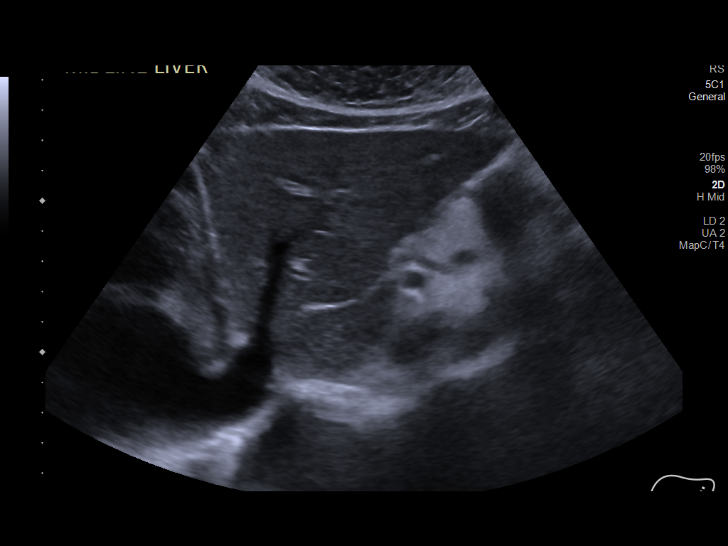
[im 10/37]
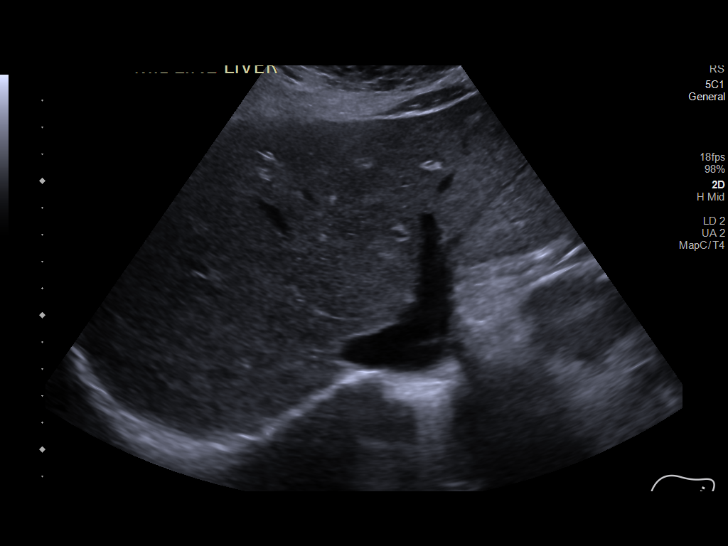
[im 13/37]
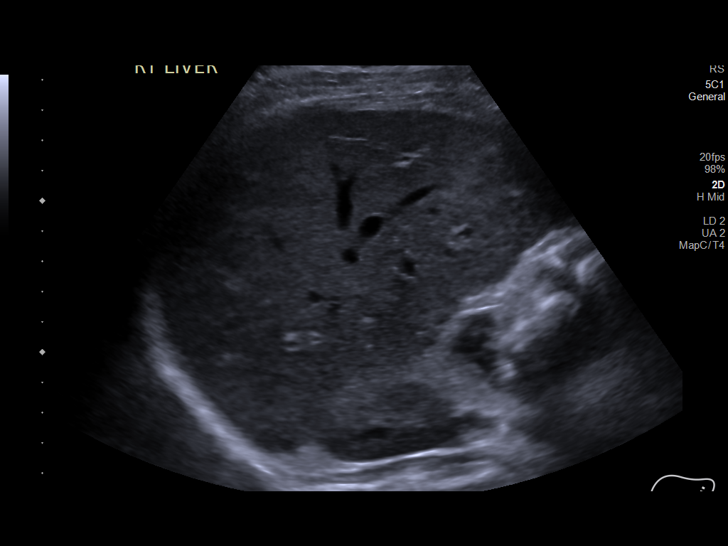
[im 14/37]
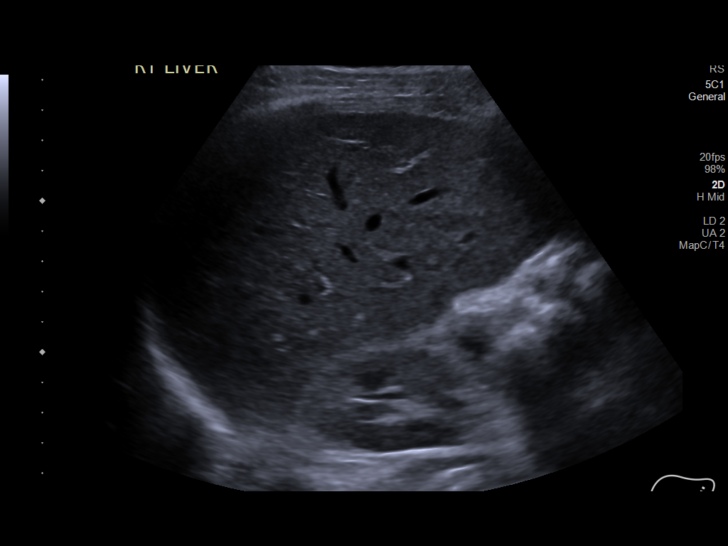
[im 17/37]
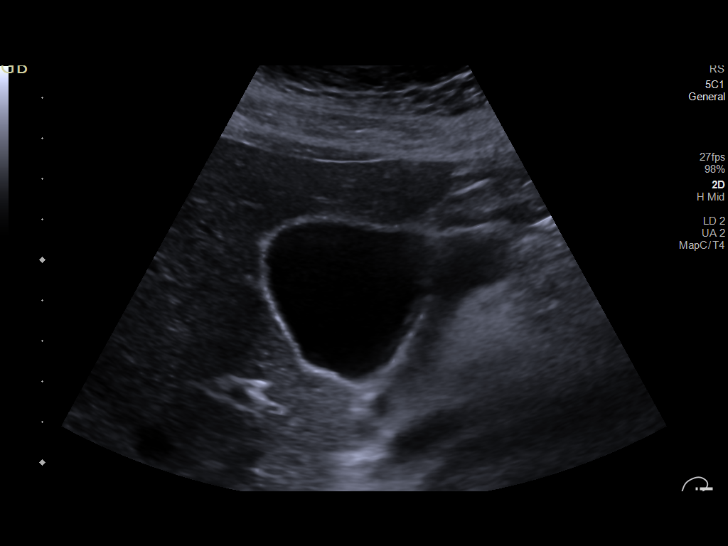
[im 20/37]
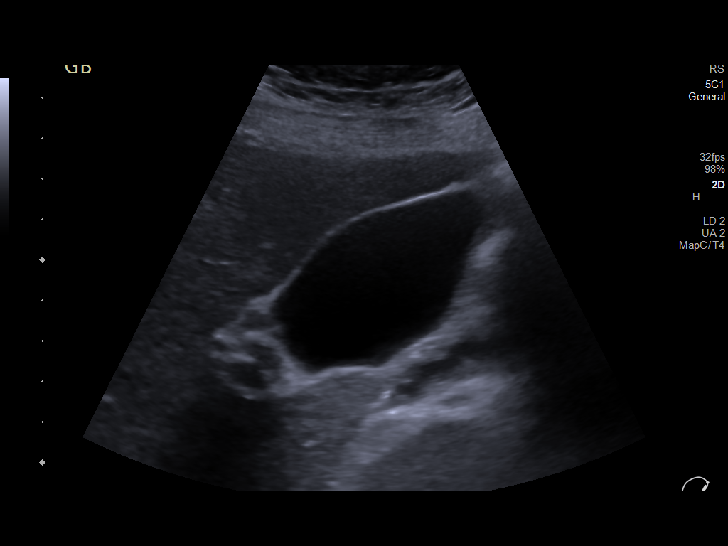
[im 23/37]
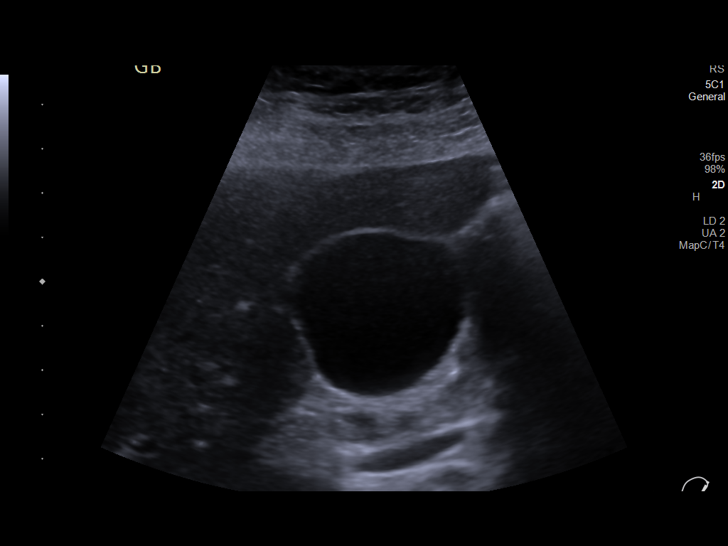
[im 25/37]
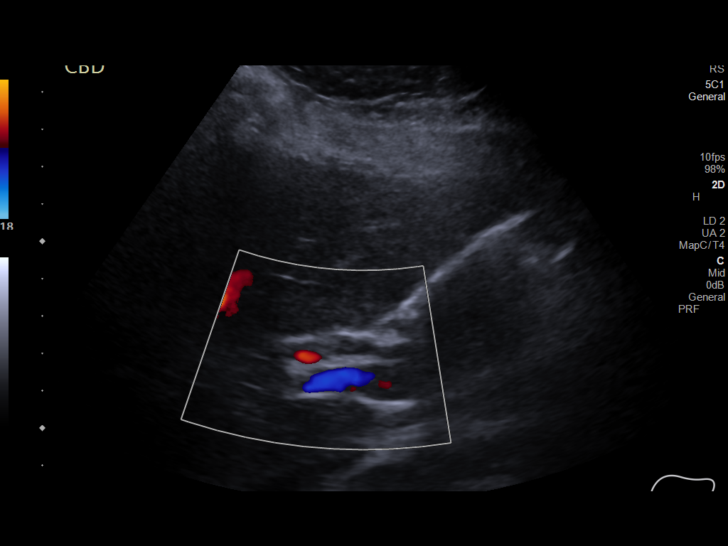
[im 28/37]
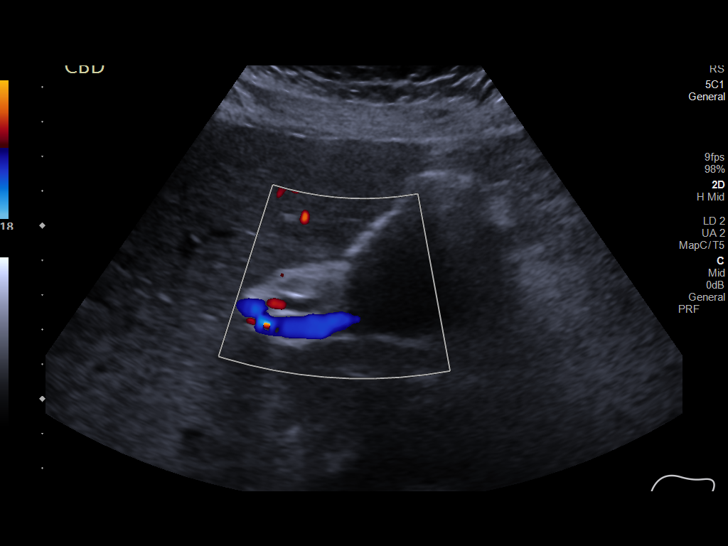
[im 31/37]
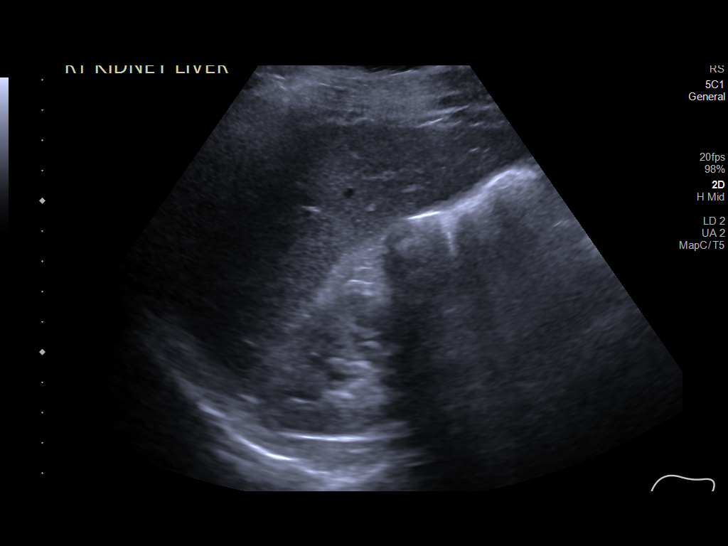
[im 34/37]
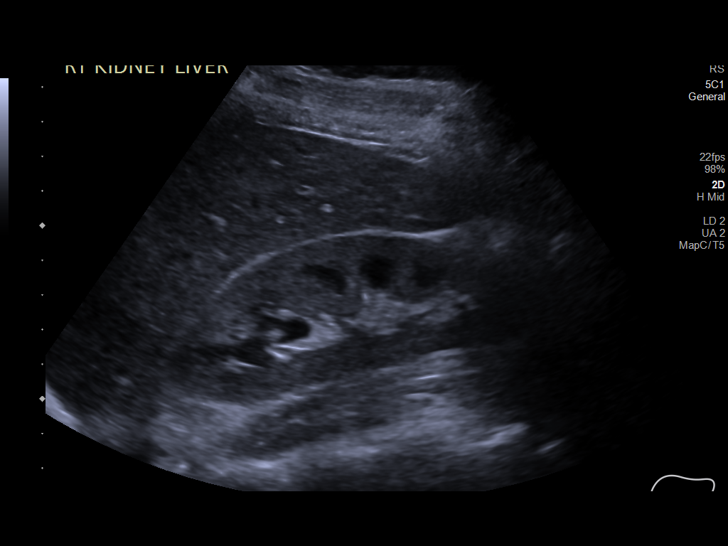
[im 37/37]
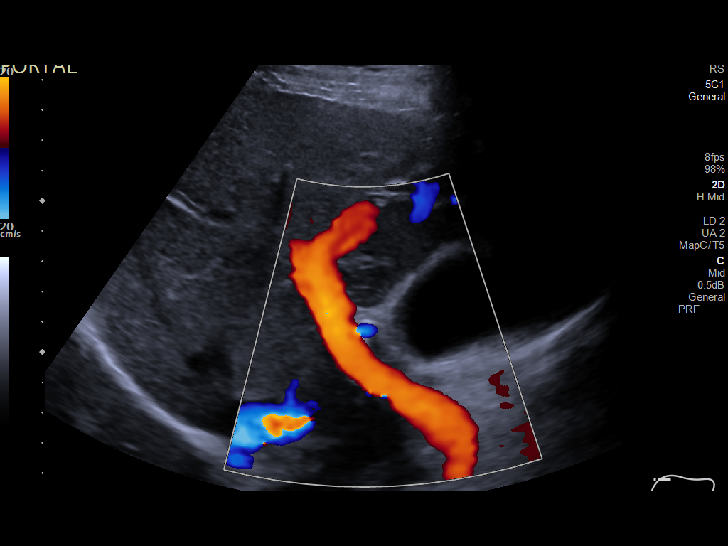

[14 of 25 positions shown; findings below may reference images not displayed]

FINDINGS: Gallbladder:

No gallstones or wall thickening visualized. No sonographic Murphy
sign noted by sonographer.

Common bile duct:

Diameter: 5 mm

Liver:

No focal lesion identified. Within normal limits in parenchymal
echogenicity. Portal vein is patent on color Doppler imaging with
normal direction of blood flow towards the liver.

Other: None.
IMPRESSION: 1. Unremarkable right upper quadrant ultrasound.

## 2021-10-05 ENCOUNTER — Ambulatory Visit (HOSPITAL_BASED_OUTPATIENT_CLINIC_OR_DEPARTMENT_OTHER)
Admission: RE | Admit: 2021-10-05 | Discharge: 2021-10-05 | Disposition: A | Discharge status: Home / Self Care | Payer: Medicaid Other | Source: Ambulatory Visit

## 2021-10-05 ENCOUNTER — Ambulatory Visit (HOSPITAL_COMMUNITY)
Admission: RE | Admit: 2021-10-05 | Discharge: 2021-10-05 | Disposition: A | Discharge status: Home / Self Care | Payer: Medicaid Other | Source: Ambulatory Visit | Attending: Cardiology | Admitting: Cardiology

## 2021-10-05 ENCOUNTER — Other Ambulatory Visit: Payer: Self-pay

## 2021-10-05 VITALS — BP 120/64 | HR 77 | Wt 167.8 lb

## 2021-10-05 DIAGNOSIS — Z8616 Personal history of COVID-19: Secondary | ICD-10-CM | POA: Diagnosis not present

## 2021-10-05 DIAGNOSIS — B1920 Unspecified viral hepatitis C without hepatic coma: Secondary | ICD-10-CM | POA: Insufficient documentation

## 2021-10-05 DIAGNOSIS — I272 Pulmonary hypertension, unspecified: Secondary | ICD-10-CM

## 2021-10-05 DIAGNOSIS — Z8249 Family history of ischemic heart disease and other diseases of the circulatory system: Secondary | ICD-10-CM | POA: Insufficient documentation

## 2021-10-05 DIAGNOSIS — I110 Hypertensive heart disease with heart failure: Secondary | ICD-10-CM | POA: Insufficient documentation

## 2021-10-05 DIAGNOSIS — R5383 Other fatigue: Secondary | ICD-10-CM | POA: Diagnosis not present

## 2021-10-05 DIAGNOSIS — G4700 Insomnia, unspecified: Secondary | ICD-10-CM | POA: Diagnosis not present

## 2021-10-05 DIAGNOSIS — I50810 Right heart failure, unspecified: Secondary | ICD-10-CM | POA: Insufficient documentation

## 2021-10-05 DIAGNOSIS — E039 Hypothyroidism, unspecified: Secondary | ICD-10-CM | POA: Insufficient documentation

## 2021-10-05 DIAGNOSIS — F17200 Nicotine dependence, unspecified, uncomplicated: Secondary | ICD-10-CM | POA: Insufficient documentation

## 2021-10-05 DIAGNOSIS — Z79899 Other long term (current) drug therapy: Secondary | ICD-10-CM | POA: Insufficient documentation

## 2021-10-05 LAB — BASIC METABOLIC PANEL
Anion gap: 9 (ref 5–15)
BUN: 12 mg/dL (ref 6–20)
CO2: 26 mmol/L (ref 22–32)
Calcium: 9.4 mg/dL (ref 8.9–10.3)
Chloride: 104 mmol/L (ref 98–111)
Creatinine, Ser: 0.98 mg/dL (ref 0.44–1.00)
GFR, Estimated: >60 mL/min (ref >60)
Glucose, Bld: 85 mg/dL (ref 70–99)
Potassium: 4.2 mmol/L (ref 3.5–5.1)
Sodium: 139 mmol/L (ref 135–145)

## 2021-10-05 LAB — ECHOCARDIOGRAM COMPLETE
AR max vel: 2.87 cm2
AV Area VTI: 2.82 cm2
AV Area mean vel: 2.93 cm2
AV Mean grad: 3 mmHg
AV Peak grad: 5.3 mmHg
Ao pk vel: 1.15 m/s
Area-P 1/2: 3.24 cm2
MV VTI: 2.94 cm2
S' Lateral: 3.3 cm

## 2021-10-05 LAB — BRAIN NATRIURETIC PEPTIDE: B Natriuretic Peptide: 12.9 pg/mL (ref 0.0–100.0)

## 2021-10-05 MED ORDER — FUROSEMIDE 40 MG PO TABS: 20.0000 mg | ORAL_TABLET | Freq: Every day | ORAL | 1 refills | Status: DC

## 2021-10-05 MED ORDER — BUPROPION HCL ER (SR) 150 MG PO TB12: ORAL_TABLET | ORAL | 2 refills | Status: DC

## 2021-10-05 NOTE — Patient Instructions (Signed)
Medication Changes:  Stop digoxin  Decrease lasix to 20 mg daily  Start Wellbutrin 150 mg as directed on the bottle  Lab Work:  Labs done today, your results will be available in MyChart, we will contact you for abnormal readings.   Testing/Procedures:  none  Referrals:  none  Special Instructions // Education:  none  Follow-Up in: 4 months  At the [facility], you and your health needs are our priority. We have a designated team specialized in the treatment of Heart Failure. This Care Team includes your primary Heart Failure Specialized Cardiologist (physician), Advanced Practice Providers (APPs- Physician Assistants and Nurse Practitioners), and Pharmacist who all work together to provide you with the care you need, when you need it.   You may see any of the following providers on your designated Care Team at your next follow up:  Dr Arvilla Meres Dr Carron Curie, NP Robbie Lis, [place] [facility] [place], [place] Karle Plumber, PharmD   Please be sure to bring in all your medications bottles to every appointment.   Need to Contact us:  If you have any questions or concerns before your next appointment please send [place] a message through [place] or call our office at [phone].    TO LEAVE A MESSAGE FOR THE NURSE SELECT OPTION 2, PLEASE LEAVE A MESSAGE INCLUDING: YOUR NAME DATE OF BIRTH CALL BACK NUMBER REASON FOR CALL**this is important as we prioritize the call backs  YOU WILL RECEIVE A CALL BACK THE SAME DAY AS LONG AS YOU CALL BEFORE 4:00 PM

## 2021-10-05 NOTE — Progress Notes (Signed)
°  Echocardiogram 2D Echocardiogram has been performed.  Zoe Roach 10/05/2021, 11:00 AM

## 2021-10-05 NOTE — Progress Notes (Signed)
PCP: Leilani Ableeese, Betti, MD Cardiology: Dr. Shirlee LatchMcLean  31 y.o. with history of pulmonary hypertension and systemic hypertension presents for hospital followup after recent admission with RV failure. Patient also has prior history of IV drug use and COVID-19 infection roughly 1 year ago.  She was referred to general cardiology in 11/21 for evaluation of hypertension and abnormal EKG with findings consistent with right ventricular hypertrophy and RV strain.  She was initially placed on losartan for systemic hypertension but this was discontinued due to facial swelling concerning for angioedema. She was tried on diltiazem CD but also had trouble with this medication as well as Toprol XL.  She also complained of persistent dyspnea that had been present over the last year, following her COVID-19 infection.  Dyspnea gradually worsened over time to the point where it was present with minimal exertion.  [facility], showing EF 50-55%, mild LVH, D-shaped septum, severe RV enlargement, moderately decreased RV systolic function, moderate-severe TR, PASP 109, severe RAE.  Based on these findings, she was [facility] for evaluation of RV failure.    She was volume overloaded on exam and was diuresed with IV Lasix with significant improvement in her breathing.  RHC showed severe pulmonary arterial hypertension and low cardiac output.  V/Q scan and CTA chest were not suggestive of acute or chronic PE, PFTs were normal. Rheumatologic serologies and HIV were negative.  She was started on ambrisentan and tadalafil in the hospital.   In terms of her systemic HTN, were were concerned about secondary causes given her young age.  Renal artery dopplers did not show significant stenosis (worried about FMD), renin/aldosterone ratio was normal.  Plasma normetanephrine was elevated but 24 hour urine catecholamines were normal. She saw an endocrinologist and is not thought to have pheochromocytoma.    HCV antibody was normal but RNA not detected.  Abdominal US showed normal liver and LFTs were normal. She has had exposure to HCV in the past and used to use IV drugs.   She had an urticarial rash in the hospital of uncertain etiology (was present at admission), she got Solumedrol and this has resolved.   Echo 2/22 showed EF 60-65%, moderate RV dilation with moderately decreased systolic function, D-shaped septum, PASP 53 mmHg (down from 109 mmHg on last echo), normal IVC.   Echo was done today and reviewed, EF 55-60%, mild RV enlargement with normal RV systolic function, PASP 31.5 mmHg, normal IVC.   Here today for follow up of pulmonary hypertension. On her own, she decreased Uptravi to 600 mcg bid.  She had gotten it up as high as 2400 mcg bid, but she feels like symptoms flared up (headache, diarrhea) and would not stop, so she titrated down to 600 mcg bid on her own.  She feels like she can tolerate this dose though she still gets mild headaches.  Weight down 2 lbs.  Overall, she feels quite good.  No significant exertional dyspnea.  No chest pain.  No orthopnea/PND.  Tries to exercise on most days. Still smoking 3-4 cigarettes/day.   6 minute walk (11/21): 427 meters 6 minute walk (12/21): 396.2 meters 6 minute walk (5/22): 518 meters 6 minute walk (11/22): 549 meters 6 minute walk (2/23): 457 meters (but she was in flip flops)  Labs (11/21): K 5, creatinine 1.16, RF mildly elevated 14, anti-CCP negative, HCV ab+ but RNA not detected, EBV IgG+ but IgM-, LFTs normal, TSH 22 with free T3  and free T4 normal, anti-SCL70 negative, anti-centromere negative, plasma normetanephrine elevated but 24 hour urine catecholeamines normal, PRA/PAC normal, ANA negative, HIV negative, BNP 905.  Labs (12/21): K 3.7, creatinine 1.0, Digoxin 0.6, BNP 40, serum pregnancy test: negative Labs (4/22): K 3.6, creatinine 1.02 Labs (5/22): digoxin 0.3, BNP 32, K 3.6, creatinine 1.15  PMH: 1. HTN - Renal artery  dopplers without hemodynamically significant stenosis.  - Serum normetanephrine elevated but 24 hour urine catecholamines normal => saw endocrinologist, not thought to have pheochromocytoma.  - PRA/PAC ration normal.  2. Hypothyroidism 3. Prior h/o IVDU 4. Active smoker 5. Pulmonary hypertension: Suspect primary pulmonary hypertension.  - RHC (11/21): mean RA 6, PA 104/56 mean 74, mean PCWP 12, CI 1.41, PVR 23 - Echo (11/21): EF 50-55%, mild LVH, D-shaped septum, severe RV enlargement, moderately decreased RV systolic function, moderate-severe TR, PASP 109, severe RAE.  - V/Q scan (11/21) negative - CTA chest (11/21): No PE or ILD.  - PFTs (11/21): normal - Rheumatologic serologic workup negative, LFTs normal, HIV negative.  - Echo (2/22): EF 60-65%, moderate RV dilation with moderately decreased systolic function (RV EF 36%), D-shaped septum, PASP 53 mmHg (down from 109 mmHg on last echo), normal IVC.  - Negative sleep study.  - Echo (2/23): EF 55-60%, mild RV enlargement with normal RV systolic function, PASP 31.5 mmHg, normal IVC. 6. HCV antibody positive: RNA not detected.  Abdominal US showed normal liver.  7. COVID-19 infection 2020  SH: Married, current smoker, prior IVDU. Occasional ETOH.   Family History  Problem Relation Age of Onset   Hypertension Paternal Grandfather    Hypertension Mother    ROS: All systems reviewed and negative except as per HPI.   Current Outpatient Medications  Medication Sig Dispense Refill   ALPRAZolam (XANAX) 0.5 MG tablet Take 0.5 mg by mouth daily.     amLODipine (NORVASC) 5 MG tablet TAKE 1 TABLET (5 MG TOTAL) BY MOUTH DAILY. 90 tablet 3   ASPIRIN 81 PO Take 81 mg by mouth daily.     buPROPion (WELLBUTRIN SR) 150 MG 12 hr tablet 1 tablet daily for 6 days the 1 tablet twice a day after that 60 tablet 2   ibuprofen (ADVIL) 800 MG tablet Take 400 mg by mouth every 6 (six) hours as needed for moderate pain.      lansoprazole (PREVACID) 15 MG  capsule Take 15 mg by mouth daily.     LETAIRIS 10 MG tablet TAKE 1 TABLET DAILY 30 tablet 11   levonorgestrel (MIRENA) 20 MCG/24HR IUD 1 each by Intrauterine route once.     levothyroxine (SYNTHROID) 100 MCG tablet Take 1 tablet (100 mcg total) by mouth daily before breakfast. 90 tablet 1   Selexipag (UPTRAVI) 1200 MCG TABS Take 1,200 mcg by mouth 2 (two) times daily. 180 tablet 3   Selexipag (UPTRAVI) 200 MCG TABS Take 2 tablets (400 mcg total) by mouth in the morning and at bedtime. 240 tablet 3   tadalafil, PAH, (ADCIRCA) 20 MG tablet TAKE 2 TABLETS BY MOUTH EVERY DAY 60 tablet 11   furosemide (LASIX) 40 MG tablet Take 0.5 tablets (20 mg total) by mouth daily. 180 tablet 1   No current facility-administered medications for this encounter.    BP 120/64    Pulse 77    Wt 76.1 kg (167 lb 12.8 oz)    SpO2 98%    BMI 27.92 kg/m    Wt Readings from Last 3 Encounters:  10/05/21 76.1 kg (  167 lb 12.8 oz)  07/01/21 76.9 kg (169 lb 9.6 oz)  06/03/21 76.7 kg (169 lb)  General: NAD Neck: No JVD, no thyromegaly or thyroid nodule.  Lungs: Clear to auscultation bilaterally with normal respiratory effort. CV: Nondisplaced PMI.  Heart regular S1/S2, no S3/S4, no murmur.  No peripheral edema.  No carotid bruit.  Normal pedal pulses.  Abdomen: Soft, nontender, no hepatosplenomegaly, no distention.  Skin: Intact without lesions or rashes.  Neurologic: Alert and oriented x 3.  Psych: Normal affect. Extremities: No clubbing or cyanosis.  HEENT: Normal.   Assessment/Plan:  1. Pulmonary hypertension/RV failure: Echo 11/21 with normal LV size with mild LV hypertrophy, EF 50-55%,  D-shaped septum due to RV pressure/volume overload, severely dilated RV with moderately decreased RV systolic function, PASP 109 mmHg, dilated IVC. She had been symptomatic for months prior to this, suspect due to gradually worsening RV failure.  CTA chest did not show significant lung pathology and did not show PE and V/Q scan  negative. PFTs normal. No diet/weight loss drugs.  HIV negative and rheumatologic serologies negative.  HCV antibody positive but liver normal on [place] and LFTs normal.  RHC in 11/21 showed severe pulmonary arterial hypertension with low cardiac output and normal filling pressures.  She will need aggressive treatment of PAH and given low output, may need IV therapy eventually.  Echo in 2/22 showed moderate RV dilation with moderate RV dysfunction, but PASP down to 53 mmHg (109 mmHg on prior echo).  Echo in 2/23 with further improvement, EF 55-60%, mild RV enlargement with normal RV systolic function, PASP 31.5 mmHg, normal IVC.  Suspect primary pulmonary hypertension. Sleep study negative. She feels good, NYHA class I symptoms now, not volume overloaded on exam.  She did not go as far on 6 minute walk as at last visit, but she was wearing flip flops today.   - Decrease Lasix to 20 mg daily.  She can stop in the future if volume remains well-managed.  BMET/BNP today.      - Continue tadalafil 40 mg daily. - I think she can stop digoxin with improved-looking RV.   - Continue ambrisentan 10 mg daily today.  She has IUD and gets monthly pregnancy tests.  - Selexipag 600 mcg bid.  Unable to take higher dose due to headaches and diarrhea.   2. Systemic HTN: Patient has had systemic HTN for some time.  She has a strong family history of this. She thinks losartan and diltiazem caused facial swelling so has stopped them.  She thinks metoprolol caused a rash.  Concern for secondary HTN given young age (and family history).  Renal artery dopplers in 11/21 without evidence for hemodynamically significant stenosis.  Plasma renin and aldosterone ratio normal.  Serum normetanephrine elevated at 213 pg/mL with normal metanephrine.  This can be a false positive.  Since 24 hour urine catecholamines were negative, suspect not pheochromocytoma. BP now controlled.    - Continue amlodipine to 5 mg daily. 3. Hypothyroidism: Continue  Levoxyl, followed by endocrinology.   4. 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 Says she has quit.  5. Hep C: HCV ab reactive. H/o IVDU and has had prior exposure to HCV. Abdominal US without cirrhosis and LFTs normal.  HCV RNA was negative => suspect infection was cleared.  6. Fatigue/sleepiness: Negative sleep study. 7. Active smoker: I strongly encouraged her to quit.  - I will have her try Wellbutrin for smoking cessation.   Followup in 4 months.  Marca Ancona 10/05/2021

## 2021-10-05 NOTE — Progress Notes (Signed)
Patient completed 6 min walk.  She went 1500 feet.  Her HR ranged between 82-103 bpm and Oxygen saturations 96-99%.

## 2021-10-28 ENCOUNTER — Other Ambulatory Visit (HOSPITAL_COMMUNITY): Payer: Self-pay | Admitting: Adult Health

## 2021-11-22 ENCOUNTER — Other Ambulatory Visit: Payer: Self-pay | Admitting: Internal Medicine

## 2021-12-02 ENCOUNTER — Ambulatory Visit (INDEPENDENT_AMBULATORY_CARE_PROVIDER_SITE_OTHER): Payer: Medicaid Other | Admitting: Internal Medicine

## 2021-12-02 ENCOUNTER — Encounter: Payer: Self-pay | Admitting: Internal Medicine

## 2021-12-02 VITALS — BP 120/72 | HR 83 | Ht 65.0 in | Wt 171.0 lb

## 2021-12-02 DIAGNOSIS — E010 Iodine-deficiency related diffuse (endemic) goiter: Secondary | ICD-10-CM

## 2021-12-02 DIAGNOSIS — E039 Hypothyroidism, unspecified: Secondary | ICD-10-CM | POA: Diagnosis not present

## 2021-12-02 LAB — TSH: TSH: 5.1 u[IU]/mL (ref 0.35–5.50)

## 2021-12-02 MED ORDER — LEVOTHYROXINE SODIUM 112 MCG PO TABS: 112.0000 ug | ORAL_TABLET | Freq: Every day | ORAL | 3 refills | Status: DC

## 2021-12-02 NOTE — Progress Notes (Signed)
? ?Name: Zoe Roach  ?MRN/ DOB: [id number], [birth year]    ?Age/ Sex: 31 y.o., female   ? ? ?PCP: Leilani Able, MD   ?Reason for Endocrinology Evaluation: Hypothyroidism  ?   ?Initial Endocrinology Clinic Visit: 08/25/2020  ? ? ?PATIENT IDENTIFIER: Zoe Roach is a 31 y.o., female with a past medical history of HTN , IV drug abuse, Pulmonary HTN and RV failure. She has followed with [facility] since 08/25/2020 for consultative assistance with management of her Elevated catecholamines  ? ?HISTORICAL SUMMARY:  ? ?During evaluation for HTN/ Pulmonary hypertension and RV failure she was noted to have a slightly elevated (06/2020)  plasma  normetanephrine's 213.9 with normal plasma metanephrine.  ?Urinary dopamine, epinephrines, metanephrines and normetanephrines have all come back normal with slight elevation of norepinephrine at 141  ug ( reference 0-135 )   ?Aldo and renin are normal at 2.8 and 1.031 respectively.  ?Repeat 24-hr urine catecholamine and cortisol were all normal by 08/2020 ? ? ? ? ?THYROID HISTORY: ?  ?She has been diagnosed with hypothyroidism in the summer of 2021 , she was started  On  levothyroxine at the time.  ? ?SUBJECTIVE:  ? ? ?Today (12/02/2021):  Ms. [name] is here for hypothyroidism. ? ?Weight has been stable  ?Has occasional constipation but no  diarrhea   ?Denies local neck symptoms  ?Denies palpitations  ?Denies SOB  ? ?She continues to follow-up with cardiology for pulmonary hypertension ? ? ?Levothyroxine 100 mcg daily  ? ? ? ?HISTORY:  ?Past Medical History:  ?Past Medical History:  ?Diagnosis Date  ? CHF (congestive heart failure) (HCC)   ? History of COVID-19   ? Hypothyroidism (acquired)   ? IVDU (intravenous drug user)   ? SOB (shortness of breath)   ? ?Past Surgical History:  ?Past Surgical History:  ?Procedure Laterality Date  ? CESAREAN SECTION    ? RIGHT HEART CATH N/A 06/21/2020  ? Procedure: RIGHT HEART CATH;  Surgeon: Laurey Morale, MD;   [facility] INVASIVE CV LAB;  Service: Cardiovascular;  Laterality: N/A;  ? ?Social History:  reports that she has been smoking cigarettes. She has a 7.50 pack-year smoking history. She has never used smokeless tobacco. She reports current alcohol use. She reports current drug use. ?Family History:  ?Family History  ?Problem Relation Age of Onset  ? Hypertension Paternal Grandfather   ? Hypertension Mother   ? ? ? ?HOME MEDICATIONS: ?Allergies as of 12/02/2021   ? ?   Reactions  ? Almond (diagnostic) Hives  ? Cherry Hives  ? Diltiazem Other (See Comments)  ? Swollen face  ? Losartan Swelling  ? Peach [prunus Persica] Hives  ? Metoprolol Rash  ? ?  ? ?  ?Medication List  ?  ? ?  ? Accurate as of December 02, 2021  9:22 AM. If you have any questions, ask your nurse or doctor.  ?  ?  ? ?  ? ?STOP taking these medications   ? ?tadalafil 20 MG tablet ?Commonly known as: CIALIS ?Stopped by: Scarlette Shorts, MD ?  ? ?  ? ?TAKE these medications   ? ?ALPRAZolam 0.5 MG tablet ?Commonly known as: Prudy Feeler ?Take 0.5 mg by mouth daily. ?  ?ALPRAZolam 1 MG tablet ?Commonly known as: Prudy Feeler ?Take 1 mg by mouth 3 (three) times daily. ?  ?amLODipine 5 MG tablet ?Commonly known as: NORVASC ?TAKE 1 TABLET (5 MG TOTAL) BY MOUTH DAILY. ?  ?ASPIRIN 81 PO ?  Take 81 mg by mouth daily. ?  ?buPROPion 150 MG 12 hr tablet ?Commonly known as: Wellbutrin SR ?1 tablet daily for 6 days the 1 tablet twice a day after that ?  ?furosemide 40 MG tablet ?Commonly known as: LASIX ?Take 0.5 tablets (20 mg total) by mouth daily. ?  ?ibuprofen 800 MG tablet ?Commonly known as: ADVIL ?Take 400 mg by mouth every 6 (six) hours as needed for moderate pain. ?  ?lansoprazole 15 MG capsule ?Commonly known as: PREVACID ?Take 15 mg by mouth daily. ?  ?Letairis 10 MG tablet ?Generic drug: ambrisentan ?TAKE 1 TABLET DAILY ?  ?levonorgestrel 20 MCG/24HR IUD ?Commonly known as: MIRENA ?1 each by Intrauterine route once. ?  ?levothyroxine 100 MCG tablet ?Commonly known  as: SYNTHROID ?TAKE 1 TABLET BY MOUTH DAILY BEFORE BREAKFAST. ?  ?montelukast 10 MG tablet ?Commonly known as: SINGULAIR ?Take 10 mg by mouth daily. ?  ?tadalafil (PAH) 20 MG tablet ?Commonly known as: ADCIRCA ?TAKE 2 TABLETS BY MOUTH EVERY DAY ?  ?Uptravi 1200 MCG Tabs ?Generic drug: Selexipag ?Take 1,200 mcg by mouth 2 (two) times daily. ?What changed:  ?how much to take ?Another medication with the same name was removed. Continue taking this medication, and follow the directions you see here. ?  ? ?  ? ? ? ? ?OBJECTIVE:  ? ?PHYSICAL EXAM: ?VS: BP 120/72 (BP Location: Left Arm, Patient Position: Sitting, Cuff Size: Large)   Pulse 83   Ht 5\' 5"  (1.651 m)   Wt 171 lb (77.6 kg)   BMI 28.46 kg/m?   ? ?EXAM: ?General: Pt appears well and is in NAD  ?Neck: General: Supple without adenopathy. ?Thyroid: Thyroid size normal.  No goiter or nodules appreciated. No thyroid bruit.  ?Lungs: Clear with good BS bilat with no rales, rhonchi, or wheezes  ?Heart: Auscultation: RRR.  ?Abdomen: Normoactive bowel sounds, soft, nontender, without masses or organomegaly palpable  ?Extremities:  ?BL LE: No pretibial edema normal ROM and strength.  ? ? ? ?DATA REVIEWED: ? Latest Reference Range & Units 12/02/21 09:51  ?TSH 0.35 - 5.50 uIU/mL 5.10  ? ? ?ASSESSMENT / PLAN / RECOMMENDATIONS:  ?Hypothyroidism :  ?  ? ?- No local neck symptoms  ?-Repeat TSH shows improvement in her levels but TSH goal <4.0 you IU/mL, will increase levothyroxine ?  ?Medications : ? ?Stop levothyroxine 100 MCG daily ?Start levothyroxine 112 mcg daily ?  ? ? ?2. Thyromegaly : ? ? ?- No local neck symptoms  ?- Will proceed with thyroid ultrasound  ? ? ?F/U in 6 months  ?Labs in 3 months ? ? ?Signed electronically by: ?Abby 12/04/21, MD ? ? Endocrinology  ?[facility] ?[address] ?[place], [place] ?Phone: [phone] ?FAX: [phone]  ? ? ? ? ?CC: ?[phone], MD ?[address] [place] ?[place]  [place] ?Phone: [phone]  ?Fax: [phone] ? ? ?Return to Endocrinology clinic as below: ?Future Appointments  ?Date Time Provider Department Center  ?02/06/2022 10:40 AM 02/08/2022, MD [facility] None  ?  ? ?

## 2021-12-06 ENCOUNTER — Ambulatory Visit
Admission: RE | Admit: 2021-12-06 | Discharge: 2021-12-06 | Disposition: A | Discharge status: Home / Self Care | Payer: Medicaid Other | Source: Ambulatory Visit | Attending: Internal Medicine | Admitting: Internal Medicine

## 2021-12-06 DIAGNOSIS — E010 Iodine-deficiency related diffuse (endemic) goiter: Secondary | ICD-10-CM

## 2021-12-07 ENCOUNTER — Telehealth: Payer: Self-pay | Admitting: Internal Medicine

## 2021-12-07 DIAGNOSIS — E039 Hypothyroidism, unspecified: Secondary | ICD-10-CM

## 2021-12-07 DIAGNOSIS — E042 Nontoxic multinodular goiter: Secondary | ICD-10-CM

## 2021-12-07 NOTE — Telephone Encounter (Signed)
Discussed thyroid ultrasound results with the pt  on 12/07/2021 with the need for FNA right inferior and left inferior nodule  ? ? ? ?Pt in agreement ? ? ?Lyndle Herrlich, MD ? ?[facility]  ?[facility] ?[address] ?[place], [place] [address] ?Phone: [phone] ?FAX: [phone] ? ?

## 2021-12-20 ENCOUNTER — Other Ambulatory Visit (HOSPITAL_COMMUNITY): Payer: Self-pay

## 2021-12-20 MED ORDER — UPTRAVI 1200 MCG PO TABS: 600.0000 ug | ORAL_TABLET | Freq: Two times a day (BID) | ORAL | 6 refills | Status: DC

## 2021-12-21 ENCOUNTER — Ambulatory Visit
Admission: RE | Admit: 2021-12-21 | Discharge: 2021-12-21 | Disposition: A | Discharge status: Home / Self Care | Payer: Medicaid Other | Source: Ambulatory Visit | Attending: Internal Medicine | Admitting: Internal Medicine

## 2021-12-21 ENCOUNTER — Other Ambulatory Visit (HOSPITAL_COMMUNITY)
Admission: RE | Admit: 2021-12-21 | Discharge: 2021-12-21 | Disposition: A | Discharge status: Home / Self Care | Payer: Medicaid Other | Source: Ambulatory Visit | Attending: Internal Medicine | Admitting: Internal Medicine

## 2021-12-21 DIAGNOSIS — E041 Nontoxic single thyroid nodule: Secondary | ICD-10-CM | POA: Insufficient documentation

## 2021-12-21 DIAGNOSIS — E042 Nontoxic multinodular goiter: Secondary | ICD-10-CM

## 2021-12-22 LAB — CYTOLOGY - NON PAP

## 2022-01-04 ENCOUNTER — Other Ambulatory Visit (HOSPITAL_COMMUNITY): Payer: Self-pay | Admitting: *Deleted

## 2022-01-04 MED ORDER — UPTRAVI 600 MCG PO TABS: 600.0000 ug | ORAL_TABLET | Freq: Two times a day (BID) | ORAL | 11 refills | Status: DC

## 2022-01-06 ENCOUNTER — Encounter (HOSPITAL_COMMUNITY): Payer: Self-pay

## 2022-01-06 ENCOUNTER — Encounter: Payer: Self-pay | Admitting: Internal Medicine

## 2022-01-16 ENCOUNTER — Telehealth: Payer: Self-pay | Admitting: Internal Medicine

## 2022-01-16 DIAGNOSIS — E042 Nontoxic multinodular goiter: Secondary | ICD-10-CM

## 2022-01-16 NOTE — Telephone Encounter (Signed)
Discussed abnormal Afirma of the left lower thyroid nodule 2 cm in diameter on 01/16/2022 at 1330 pm   Her right lower thyroid nodule Afirma came back benign   She understands that there is 50% chance of malignancy of the lower thyroid nodule.  Patient in agreement with proceeding with surgical intervention   A referral has been placed to [place] surgery for further evaluation    Abby Nena Jordan, MD  [facility]  [facility] [address] [place], [place] [address] Phone: [phone] FAX: [phone]

## 2022-01-17 ENCOUNTER — Other Ambulatory Visit (HOSPITAL_COMMUNITY): Payer: Self-pay | Admitting: Cardiology

## 2022-02-06 ENCOUNTER — Ambulatory Visit (HOSPITAL_COMMUNITY)
Admission: RE | Admit: 2022-02-06 | Discharge: 2022-02-06 | Disposition: A | Discharge status: Home / Self Care | Payer: Medicaid Other | Source: Ambulatory Visit | Attending: Cardiology | Admitting: Cardiology

## 2022-02-06 ENCOUNTER — Encounter (HOSPITAL_COMMUNITY): Payer: Self-pay | Admitting: Cardiology

## 2022-02-06 VITALS — BP 120/70 | HR 98 | Wt 178.6 lb

## 2022-02-06 DIAGNOSIS — I509 Heart failure, unspecified: Secondary | ICD-10-CM | POA: Diagnosis not present

## 2022-02-06 DIAGNOSIS — R519 Headache, unspecified: Secondary | ICD-10-CM | POA: Diagnosis not present

## 2022-02-06 DIAGNOSIS — R197 Diarrhea, unspecified: Secondary | ICD-10-CM | POA: Insufficient documentation

## 2022-02-06 DIAGNOSIS — Z79899 Other long term (current) drug therapy: Secondary | ICD-10-CM | POA: Diagnosis not present

## 2022-02-06 DIAGNOSIS — Z79890 Hormone replacement therapy (postmenopausal): Secondary | ICD-10-CM | POA: Diagnosis not present

## 2022-02-06 DIAGNOSIS — F1120 Opioid dependence, uncomplicated: Secondary | ICD-10-CM | POA: Insufficient documentation

## 2022-02-06 DIAGNOSIS — Z8616 Personal history of COVID-19: Secondary | ICD-10-CM | POA: Insufficient documentation

## 2022-02-06 DIAGNOSIS — F17200 Nicotine dependence, unspecified, uncomplicated: Secondary | ICD-10-CM | POA: Insufficient documentation

## 2022-02-06 DIAGNOSIS — I272 Pulmonary hypertension, unspecified: Secondary | ICD-10-CM | POA: Diagnosis not present

## 2022-02-06 DIAGNOSIS — G8929 Other chronic pain: Secondary | ICD-10-CM | POA: Diagnosis not present

## 2022-02-06 DIAGNOSIS — E039 Hypothyroidism, unspecified: Secondary | ICD-10-CM | POA: Diagnosis not present

## 2022-02-06 DIAGNOSIS — I2721 Secondary pulmonary arterial hypertension: Secondary | ICD-10-CM | POA: Insufficient documentation

## 2022-02-06 DIAGNOSIS — B1920 Unspecified viral hepatitis C without hepatic coma: Secondary | ICD-10-CM | POA: Diagnosis not present

## 2022-02-06 DIAGNOSIS — I110 Hypertensive heart disease with heart failure: Secondary | ICD-10-CM | POA: Diagnosis present

## 2022-02-06 DIAGNOSIS — R220 Localized swelling, mass and lump, head: Secondary | ICD-10-CM | POA: Diagnosis not present

## 2022-02-06 LAB — BASIC METABOLIC PANEL
Anion gap: 8 (ref 5–15)
BUN: 18 mg/dL (ref 6–20)
CO2: 23 mmol/L (ref 22–32)
Calcium: 9 mg/dL (ref 8.9–10.3)
Chloride: 109 mmol/L (ref 98–111)
Creatinine, Ser: 1.2 mg/dL — ABNORMAL HIGH (ref 0.44–1.00)
GFR, Estimated: >60 mL/min (ref >60)
Glucose, Bld: 126 mg/dL — ABNORMAL HIGH (ref 70–99)
Potassium: 4.3 mmol/L (ref 3.5–5.1)
Sodium: 140 mmol/L (ref 135–145)

## 2022-02-06 LAB — BRAIN NATRIURETIC PEPTIDE: B Natriuretic Peptide: 12.5 pg/mL (ref 0.0–100.0)

## 2022-02-06 NOTE — Progress Notes (Signed)
PCP: Leilani Able, MD Cardiology: Dr. Shirlee Latch  31 y.o. with history of pulmonary hypertension and systemic hypertension presents for hospital followup after recent admission with RV failure. Patient also has prior history of IV drug use and COVID-19 infection roughly 1 year ago.  She was referred to general cardiology in 11/21 for evaluation of hypertension and abnormal EKG with findings consistent with right ventricular hypertrophy and RV strain.  She was initially placed on losartan for systemic hypertension but this was discontinued due to facial swelling concerning for angioedema. She was tried on diltiazem CD but also had trouble with this medication as well as Toprol XL.  She also complained of persistent dyspnea that had been present over the last year, following her COVID-19 infection.  Dyspnea gradually worsened over time to the point where it was present with minimal exertion.  Echo was done at the [facility] office in 11/21, showing EF 50-55%, mild LVH, D-shaped septum, severe RV enlargement, moderately decreased RV systolic function, moderate-severe TR, PASP 109, severe RAE.  Based on these findings, she was admitted to [facility] for evaluation of RV failure.    She was volume overloaded on exam and was diuresed with IV Lasix with significant improvement in her breathing.  RHC showed severe pulmonary arterial hypertension and low cardiac output.  V/Q scan and CTA chest were not suggestive of acute or chronic PE, PFTs were normal. Rheumatologic serologies and HIV were negative.  She was started on ambrisentan and tadalafil in the hospital.   In terms of her systemic HTN, were were concerned about secondary causes given her young age.  Renal artery dopplers did not show significant stenosis (worried about FMD), renin/aldosterone ratio was normal.  Plasma normetanephrine was elevated but 24 hour urine catecholamines were normal. She saw an endocrinologist and is not thought to have pheochromocytoma.    HCV antibody was normal but RNA not detected.  Abdominal US showed normal liver and LFTs were normal. She has had exposure to HCV in the past and used to use IV drugs.   She had an urticarial rash in the hospital of uncertain etiology (was present at admission), she got Solumedrol and this has resolved.   Echo 2/22 showed EF 60-65%, moderate RV dilation with moderately decreased systolic function, D-shaped septum, PASP 53 mmHg (down from 109 mmHg on last echo), normal IVC.   Echo was done today and reviewed, EF 55-60%, mild RV enlargement with normal RV systolic function, PASP 31.5 mmHg, normal IVC.   Here today for follow up of pulmonary hypertension. On her own, she decreased Uptravi to 600 mcg bid.  She had gotten it up as high as 2400 mcg bid, but she feels like symptoms flared up (headache, diarrhea) and would not stop, so she titrated down to 600 mcg bid on her own.  She feels like she can tolerate this dose though she still gets mild headaches.  Weight is up a bit.  She denies exertional dyspnea.  Doing yoga and going to gym to exercise.  Getting monthly pregnancy tests (negative).  No lightheadedness/syncope.  No chest pain.  She has a worrisome thyroid nodule, to see general surgery for excision.   6 minute walk (11/21): 427 meters 6 minute walk (12/21): 396.2 meters 6 minute walk (5/22): 518 meters 6 minute walk (11/22): 549 meters 6 minute walk (2/23): 457 meters (but she was in flip flops) 6 minute walk (6/23): 518 meters  Labs (11/21): K 5, creatinine 1.16, RF mildly elevated 14, anti-CCP  negative, HCV ab+ but RNA not detected, EBV IgG+ but IgM-, LFTs normal, TSH 22 with free T3 and free T4 normal, anti-SCL70 negative, anti-centromere negative, plasma normetanephrine elevated but 24 hour urine catecholeamines normal, PRA/PAC normal, ANA negative, HIV negative, BNP 905.  Labs (12/21): K 3.7, creatinine 1.0, Digoxin 0.6, BNP 40, serum pregnancy test: negative Labs (4/22): K 3.6,  creatinine 1.02 Labs (5/22): digoxin 0.3, BNP 32, K 3.6, creatinine 1.15 Labs (2/23): K 4.2, creatinine 0.98, BNP 12.9  PMH: 1. HTN - Renal artery dopplers without hemodynamically significant stenosis.  - Serum normetanephrine elevated but 24 hour urine catecholamines normal => saw endocrinologist, not thought to have pheochromocytoma.  - PRA/PAC ration normal.  2. Hypothyroidism 3. Prior h/o IVDU 4. Active smoker 5. Pulmonary hypertension: Suspect primary pulmonary hypertension.  - RHC (11/21): mean RA 6, PA 104/56 mean 74, mean PCWP 12, CI 1.41, PVR 23 - Echo (11/21): EF 50-55%, mild LVH, D-shaped septum, severe RV enlargement, moderately decreased RV systolic function, moderate-severe TR, PASP 109, severe RAE.  - V/Q scan (11/21) negative - CTA chest (11/21): No PE or ILD.  - PFTs (11/21): normal - Rheumatologic serologic workup negative, LFTs normal, HIV negative.  - Echo (2/22): EF 60-65%, moderate RV dilation with moderately decreased systolic function (RV EF 36%), D-shaped septum, PASP 53 mmHg (down from 109 mmHg on last echo), normal IVC.  - Negative sleep study.  - Echo (2/23): EF 55-60%, mild RV enlargement with normal RV systolic function, PASP 31.5 mmHg, normal IVC. 6. HCV antibody positive: RNA not detected.  Abdominal US showed normal liver.  7. COVID-19 infection 2020 8. Thyroid nodule  SH: Married, current smoker, prior IVDU. Occasional ETOH.   Family History  Problem Relation Age of Onset   Hypertension Paternal Grandfather    Hypertension Mother    ROS: All systems reviewed and negative except as per HPI.   Current Outpatient Medications  Medication Sig Dispense Refill   ALPRAZolam (XANAX) 1 MG tablet Take 1 mg by mouth 3 (three) times daily.     amLODipine (NORVASC) 5 MG tablet TAKE 1 TABLET (5 MG TOTAL) BY MOUTH DAILY. 90 tablet 3   buPROPion (WELLBUTRIN SR) 150 MG 12 hr tablet Take 150 mg by mouth 2 (two) times daily.     cetirizine (ZYRTEC) 10 MG tablet  Take 10 mg by mouth daily.     furosemide (LASIX) 40 MG tablet Take 0.5 tablets (20 mg total) by mouth daily. 180 tablet 1   ibuprofen (ADVIL) 800 MG tablet Take 400 mg by mouth every 6 (six) hours as needed for moderate pain.      lansoprazole (PREVACID) 15 MG capsule Take 15 mg by mouth daily.     LETAIRIS 10 MG tablet TAKE 1 TABLET DAILY 30 tablet 11   levonorgestrel (MIRENA) 20 MCG/24HR IUD 1 each by Intrauterine route once.     levothyroxine (SYNTHROID) 112 MCG tablet Take 1 tablet (112 mcg total) by mouth daily. 90 tablet 3   Selexipag (UPTRAVI) 600 MCG TABS Take 600 mcg by mouth 2 (two) times daily. 60 tablet 11   tadalafil (CIALIS) 20 MG tablet TAKE 2 TABLETS DAILY 60 tablet 11   tadalafil, PAH, (ADCIRCA) 20 MG tablet TAKE 2 TABLETS BY MOUTH EVERY DAY 60 tablet 11   No current facility-administered medications for this encounter.    BP 120/70   Pulse 98   Wt 81 kg (178 lb 9.6 oz)   SpO2 100%   BMI 29.72 kg/m  Wt Readings from Last 3 Encounters:  02/06/22 81 kg (178 lb 9.6 oz)  12/02/21 77.6 kg (171 lb)  10/05/21 76.1 kg (167 lb 12.8 oz)  General: NAD Neck: No JVD, no thyromegaly or thyroid nodule.  Lungs: Clear to auscultation bilaterally with normal respiratory effort. CV: Nondisplaced PMI.  Heart regular S1/S2, no S3/S4, no murmur.  No peripheral edema.  No carotid bruit.  Normal pedal pulses.  Abdomen: Soft, nontender, no hepatosplenomegaly, no distention.  Skin: Intact without lesions or rashes.  Neurologic: Alert and oriented x 3.  Psych: Normal affect. Extremities: No clubbing or cyanosis.  HEENT: Normal.   Assessment/Plan:  1. Pulmonary hypertension/RV failure: Echo 11/21 with normal LV size with mild LV hypertrophy, EF 50-55%,  D-shaped septum due to RV pressure/volume overload, severely dilated RV with moderately decreased RV systolic function, PASP 109 mmHg, dilated IVC. She had been symptomatic for months prior to this, suspect due to gradually worsening RV  failure.  CTA chest did not show significant lung pathology and did not show PE and V/Q scan negative. PFTs normal. No diet/weight loss drugs.  HIV negative and rheumatologic serologies negative.  HCV antibody positive but liver normal on [place] and LFTs normal.  RHC in 11/21 showed severe pulmonary arterial hypertension with low cardiac output and normal filling pressures.  She will need aggressive treatment of PAH and given low output, may need IV therapy eventually.  Echo in 2/22 showed moderate RV dilation with moderate RV dysfunction, but PASP down to 53 mmHg (109 mmHg on prior echo).  Echo in 2/23 with further improvement, EF 55-60%, mild RV enlargement with normal RV systolic function, PASP 31.5 mmHg, normal IVC.  Suspect primary pulmonary hypertension. Sleep study negative. She feels good, NYHA class I symptoms now, not volume overloaded on exam.  Improved 6 minute walk today.    - Continue Lasix 20 mg daily, does not want to stop it due to occasional "swelling."  BMET/BNP today.      - Continue tadalafil 40 mg daily. - Continue ambrisentan 10 mg daily today.  She has IUD and gets monthly pregnancy tests.  - Selexipag 600 mcg bid.  Unable to take higher dose due to headaches and diarrhea.   2. Systemic HTN: Patient has had systemic HTN for some time.  She has a strong family history of this. She thinks losartan and diltiazem caused facial swelling so has stopped them.  She thinks metoprolol caused a rash.  Concern for secondary HTN given young age (and family history).  Renal artery dopplers in 11/21 without evidence for hemodynamically significant stenosis.  Plasma renin and aldosterone ratio normal.  Serum normetanephrine elevated at 213 pg/mL with normal metanephrine.  This can be a false positive.  Since 24 hour urine catecholamines were negative, suspect not pheochromocytoma. BP now controlled.    - Continue amlodipine to 5 mg daily. 3. Hypothyroidism: Continue Levoxyl, followed by endocrinology.   Has thyroid nodule, to see surgery for possible excision.  4. 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 Says she has quit.  5. Hep C: HCV ab reactive. H/o IVDU and has had prior exposure to HCV. Abdominal US without cirrhosis and LFTs normal.  HCV RNA was negative => suspect infection was cleared.  6. Fatigue/sleepiness: Negative sleep study. 7. Active smoker: I strongly encouraged her to quit.  - Wellbutrin is helping.  8. She can stop ASA.   Followup in 4 months.   Marca Ancona 02/06/2022

## 2022-02-15 ENCOUNTER — Other Ambulatory Visit (HOSPITAL_COMMUNITY): Payer: Self-pay | Admitting: *Deleted

## 2022-02-15 ENCOUNTER — Telehealth (HOSPITAL_COMMUNITY): Payer: Self-pay | Admitting: *Deleted

## 2022-02-15 MED ORDER — UPTRAVI 800 MCG PO TABS: 800.0000 ug | ORAL_TABLET | Freq: Two times a day (BID) | ORAL | 3 refills | Status: DC

## 2022-02-15 NOTE — Telephone Encounter (Signed)
Pt called c/o worsening shortness of breath and fatigue. Pt asked if we could increase her uptravi. Pt said her symptoms have been increasing over the past few months but this week she feels worse.   Routed to Dr.McLean for advice

## 2022-02-15 NOTE — Telephone Encounter (Signed)
Left vm for pt and  Updated script sent.

## 2022-02-15 NOTE — Telephone Encounter (Signed)
Yes.  She can titrate back up on her Uptravi slowly to maximum 2400 mcg bid.  Would contact the nurse for the company who helps with this and get started on it.  Also needs to be seen back in office.

## 2022-03-01 ENCOUNTER — Other Ambulatory Visit (HOSPITAL_COMMUNITY): Payer: Self-pay | Admitting: *Deleted

## 2022-03-01 MED ORDER — UPTRAVI 1000 MCG PO TABS
1000.0000 ug | ORAL_TABLET | Freq: Two times a day (BID) | ORAL | 3 refills | Status: DC
Start: 2022-03-01 — End: 2022-03-27

## 2022-03-03 ENCOUNTER — Other Ambulatory Visit (INDEPENDENT_AMBULATORY_CARE_PROVIDER_SITE_OTHER): Payer: Medicaid Other

## 2022-03-03 DIAGNOSIS — E039 Hypothyroidism, unspecified: Secondary | ICD-10-CM | POA: Diagnosis not present

## 2022-03-03 LAB — TSH: TSH: 8.6 u[IU]/mL — ABNORMAL HIGH (ref 0.35–5.50)

## 2022-03-03 MED ORDER — LEVOTHYROXINE SODIUM 137 MCG PO TABS: 137.0000 ug | ORAL_TABLET | Freq: Every day | ORAL | 6 refills | Status: DC

## 2022-03-16 ENCOUNTER — Telehealth (HOSPITAL_COMMUNITY): Payer: Self-pay

## 2022-03-20 NOTE — Telephone Encounter (Signed)
error 

## 2022-03-27 ENCOUNTER — Other Ambulatory Visit (HOSPITAL_COMMUNITY): Payer: Self-pay | Admitting: *Deleted

## 2022-03-27 MED ORDER — UPTRAVI 1400 MCG PO TABS: 1400.0000 ug | ORAL_TABLET | Freq: Two times a day (BID) | ORAL | 11 refills | Status: DC

## 2022-05-04 ENCOUNTER — Other Ambulatory Visit (HOSPITAL_COMMUNITY): Payer: Self-pay

## 2022-05-22 ENCOUNTER — Telehealth (HOSPITAL_COMMUNITY): Payer: Self-pay | Admitting: Pharmacist

## 2022-05-22 NOTE — Telephone Encounter (Signed)
Advanced Heart Failure Patient Advocate Encounter  Prior Authorization for Zoe Roach has been approved.     Effective dates: 05/22/22 through 05/23/23  Zoe Roach, PharmD, BCPS, BCCP, CPP Heart Failure Clinic Pharmacist [phone]

## 2022-05-22 NOTE — Telephone Encounter (Signed)
Patient Advocate Encounter   Received notification from [facility] that prior authorization for Zoe Roach is required.   PA submitted on CoverMyMeds Key [id number] Status is pending   Will continue to follow.   Audry Riles, PharmD, BCPS, BCCP, CPP Heart Failure Clinic Pharmacist [phone]

## 2022-05-23 ENCOUNTER — Other Ambulatory Visit (HOSPITAL_COMMUNITY): Payer: Self-pay

## 2022-05-23 ENCOUNTER — Telehealth (HOSPITAL_COMMUNITY): Payer: Self-pay | Admitting: Pharmacist

## 2022-05-23 MED ORDER — AMBRISENTAN 10 MG PO TABS: 10.0000 mg | ORAL_TABLET | Freq: Every day | ORAL | 11 refills | Status: DC

## 2022-05-23 NOTE — Telephone Encounter (Signed)
Patient Advocate Encounter   Received notification from [facility] that prior authorization for ambrisentan is required. Of note, as of May 14, 2022, Medicaid prefers generic ambrisentan instead of brand Letairis.    PA submitted on CoverMyMeds Key [id number] Status is pending   Will continue to follow.   Audry Riles, PharmD, BCPS, BCCP, CPP Heart Failure Clinic Pharmacist [phone]

## 2022-05-23 NOTE — Telephone Encounter (Signed)
Advanced Heart Failure Patient Advocate Encounter  Prior Authorization for ambrisentan has been approved.    PA#  [id number] Effective dates: 05/23/22 through 05/24/23  Audry Riles, PharmD, BCPS, BCCP, CPP Heart Failure Clinic Pharmacist [phone]

## 2022-06-30 ENCOUNTER — Other Ambulatory Visit (HOSPITAL_COMMUNITY): Payer: Self-pay

## 2022-06-30 MED ORDER — AMLODIPINE BESYLATE 5 MG PO TABS: 5.0000 mg | ORAL_TABLET | Freq: Every day | ORAL | 3 refills | Status: DC

## 2022-07-04 ENCOUNTER — Ambulatory Visit (INDEPENDENT_AMBULATORY_CARE_PROVIDER_SITE_OTHER): Payer: Medicaid Other | Admitting: Internal Medicine

## 2022-07-04 ENCOUNTER — Encounter: Payer: Self-pay | Admitting: Internal Medicine

## 2022-07-04 VITALS — BP 124/82 | HR 102 | Ht 65.0 in | Wt 186.0 lb

## 2022-07-04 DIAGNOSIS — E042 Nontoxic multinodular goiter: Secondary | ICD-10-CM

## 2022-07-04 DIAGNOSIS — E039 Hypothyroidism, unspecified: Secondary | ICD-10-CM | POA: Diagnosis not present

## 2022-07-04 LAB — T4, FREE: Free T4: 1.4 ng/dL (ref 0.60–1.60)

## 2022-07-04 LAB — TSH: TSH: 7.05 u[IU]/mL — ABNORMAL HIGH (ref 0.35–5.50)

## 2022-07-04 NOTE — Progress Notes (Signed)
Name: Zoe Roach  MRN/ DOB: [id number], 1991-03-19    Age/ Sex: 31 y.o., female     PCP: Leilani Able, MD   Reason for Endocrinology Evaluation: Hypothyroidism     Initial Endocrinology Clinic Visit: 08/25/2020    PATIENT IDENTIFIER: [name] is a 31 y.o., female with a past medical history of HTN , IV drug abuse, Pulmonary HTN and RV failure. She has followed with [facility] since 08/25/2020 for consultative assistance with management of her Elevated catecholamines   HISTORICAL SUMMARY:   During evaluation for HTN/ Pulmonary hypertension and RV failure she was noted to have a slightly elevated (06/2020)  plasma  normetanephrine's 213.9 with normal plasma metanephrine.  Urinary dopamine, epinephrines, metanephrines and normetanephrines have all come back normal with slight elevation of norepinephrine at 141  ug ( reference 0-135 )   Aldo and renin are normal at 2.8 and 1.031 respectively.  Repeat 24-hr urine catecholamine and cortisol were all normal by 08/2020     THYROID HISTORY:   She has been diagnosed with hypothyroidism in the summer of 2021 , she was started On  levothyroxine at the time.    She was subsequently diagnosed with multinodular goiter in April 2023.  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 is here for hypothyroidism.She continues not to have labs between her visits   She has been noted with weight gain  She  continues with exercise  She was recently started on Phentermine through PCP Has occasional  constipation  Denies local neck symptoms  Denies palpitations   She continues to follow-up with cardiology for pulmonary hypertension   Levothyroxine 137 mcg daily     HISTORY:  Past Medical History:  Past Medical History:  Diagnosis Date  . CHF (congestive heart failure) (HCC)   . History of COVID-19   . Hypothyroidism (acquired)   . IVDU (intravenous drug user)   . SOB (shortness of breath)    Past Surgical History:  Past Surgical History:  Procedure Laterality Date  . CESAREAN SECTION    . RIGHT HEART CATH N/A 06/21/2020   Procedure: RIGHT HEART CATH;  Surgeon: Laurey Morale, MD;  Location: [facility] INVASIVE CV LAB;  Service: Cardiovascular;  Laterality: N/A;   Social History:  reports that she has been smoking cigarettes. She has a 7.50 pack-year smoking history. She has never used smokeless tobacco. She reports current alcohol use. She reports current drug use. Family History:  Family History  Problem Relation Age of Onset  . Hypertension Paternal Grandfather   . Hypertension Mother      HOME MEDICATIONS: Allergies as of 07/04/2022       Reactions   Almond (diagnostic) Hives   Cherry Hives   Diltiazem Other (See Comments)   Swollen face   Losartan Swelling   Peach [prunus Persica] Hives   Metoprolol Rash        Medication List        Accurate as of July 04, 2022 10:24 AM. If you have  any questions, ask your nurse or doctor.          ALPRAZolam 1 MG tablet Commonly known as: XANAX Take 1 mg by mouth 3 (three) times daily.   ambrisentan 10 MG tablet Commonly known as: LETAIRIS Take 1 tablet (10 mg total) by mouth daily.   amLODipine 5 MG tablet Commonly known as: NORVASC Take 1 tablet (5 mg total) by mouth daily.   cetirizine 10 MG tablet Commonly known as: ZYRTEC Take 10 mg by mouth daily.   cyclobenzaprine 10 MG tablet Commonly known as:  FLEXERIL Take 10 mg by mouth 3 (three) times daily.   furosemide 40 MG tablet Commonly known as: LASIX Take 0.5 tablets (20 mg total) by mouth daily.   ibuprofen 800 MG tablet Commonly known as: ADVIL Take 400 mg by mouth every 6 (six) hours as needed for moderate pain.   lansoprazole 15 MG capsule Commonly known as: PREVACID Take 15 mg by mouth daily.   levonorgestrel 20 MCG/24HR IUD Commonly known as: MIRENA 1 each by Intrauterine route once.   levothyroxine 137 MCG tablet Commonly known as: SYNTHROID Take 1 tablet (137 mcg total) by mouth daily before breakfast.   montelukast 10 MG tablet Commonly known as: SINGULAIR Take 10 mg by mouth daily.   phentermine 37.5 MG tablet Commonly known as: ADIPEX-P Take 37.5 mg by mouth daily.   tadalafil (PAH) 20 MG tablet Commonly known as: ADCIRCA TAKE 2 TABLETS BY MOUTH EVERY DAY   tadalafil 20 MG tablet Commonly known as: CIALIS TAKE 2 TABLETS DAILY   Uptravi 1400 MCG Tabs Generic drug: Selexipag Take 1,400 mcg by mouth in the morning and at bedtime.   Wellbutrin SR 150 MG 12 hr tablet Generic drug: buPROPion Take 150 mg by mouth 2 (two) times daily.          OBJECTIVE:   PHYSICAL EXAM: VS: BP 124/82 (BP Location: Left Arm, Patient Position: Sitting, Cuff Size: Small)   Pulse (!) 102   Ht 5\' 5"  (1.651 m)   Wt 186 lb (84.4 kg)   BMI 30.95 kg/m    EXAM: General: Pt appears well and is in NAD  Neck: General: Supple without adenopathy. Thyroid: Thyroid size normal.  No goiter or nodules appreciated. No thyroid bruit.  Lungs: Clear with good BS bilat with no rales, rhonchi, or wheezes  Heart: Auscultation: RRR.  Abdomen: Normoactive bowel sounds, soft, nontender, without masses or organomegaly palpable  Extremities:  BL LE: No pretibial edema normal ROM and strength.     DATA REVIEWED:  Latest Reference Range & Units 07/04/22 10:35  TSH 0.35 - 5.50 uIU/mL 7.05 (H)  T4,Free(Direct) 0.60 - 1.60 ng/dL  07/06/22    Thyroid ultrasound 12/06/2021  Estimated total number of nodules >/= 1 cm: 4   Number of spongiform nodules >/=  2 cm not described below (TR1): 0   Number of mixed cystic and solid nodules >/= 1.5 cm not described below (TR2): 0   _________________________________________________________   Nodule # 1:   Location: Right; Inferior   Maximum size: 2.0 cm; Other 2 dimensions: 2.0 x 1.5 cm   Composition: solid/almost completely solid (2)   Echogenicity: isoechoic (1)   Shape: taller-than-wide (3)   Margins: ill-defined (0)   Echogenic foci: none (0)   ACR TI-RADS total points: 6.   ACR TI-RADS risk category: TR4 (4-6 points).   ACR TI-RADS recommendations:   **Given size (>/= 1.5 cm) and appearance, fine needle aspiration of this moderately suspicious  nodule should be considered based on TI-RADS criteria.   _________________________________________________________   Nodule # 2:   Location: Left; Superior   Maximum size: 1.7 cm; Other 2 dimensions: 1.6 x 1.3 cm   Composition: mixed cystic and solid (1)   Echogenicity: isoechoic (1)   Shape: not taller-than-wide (0)   Margins: ill-defined (0)   Echogenic foci: none (0)   ACR TI-RADS total points: 2.   ACR TI-RADS risk category: TR2 (2 points).   ACR TI-RADS recommendations:   This nodule does NOT meet TI-RADS criteria for biopsy or dedicated follow-up.   _________________________________________________________   Nodule # 3:   Location: Left; Superior   Maximum size: 1.2 cm; Other 2 dimensions: 1.0 x 0.7 cm   Composition: solid/almost completely solid (2)   Echogenicity: hypoechoic (2)   Shape: not taller-than-wide (0)   Margins: ill-defined (0)   Echogenic foci: none (0)   ACR TI-RADS total points: 4.   ACR TI-RADS risk category: TR4 (4-6 points).   ACR TI-RADS recommendations:   *Given size (>/= 1 - 1.4 cm) and appearance, a follow-up ultrasound in 1 year should be  considered based on TI-RADS criteria.   _________________________________________________________   Nodule # 4:   Location: Left; Inferior   Maximum size: 2.0 cm; Other 2 dimensions: 1.5 x 0.9 cm   Composition: solid/almost completely solid (2)   Echogenicity: hypoechoic (2)   Shape: not taller-than-wide (0)   Margins: ill-defined (0)   Echogenic foci: none (0)   ACR TI-RADS total points: 4.   ACR TI-RADS risk category: TR4 (4-6 points).   ACR TI-RADS recommendations:   **Given size (>/= 1.5 cm) and appearance, fine needle aspiration of this moderately suspicious nodule should be considered based on TI-RADS criteria.   _________________________________________________________   Nonspecific thyroid heterogeneity and hypervascularity noted. No regional adenopathy.   IMPRESSION: 2 cm right inferior TR 4 nodule and 2 cm left inferior TR 4 nodule. Both meet criteria for biopsy as above. (Nodules 1 and 4)   1.2 cm left 2 superior thyroid TR 4 nodule meets criteria follow-up in 1 year.     FNA left inferior nodule 12/21/2021  Clinical History: Nodule #4: Left; Inferior, Maximum size: 2.0 cm; Other  2 dimensions: 1.5 x 0.9 cm, solid/almost completely solid, hypoechoic,  TI-RADS total points: 4.  Specimen Submitted:  A. 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 Specimen Submitted:  A. 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 Thyromegaly :   - No local neck symptoms  - She is s/p FNA of right inferior nodule  12/21/2021 with cytology report consistent of follicular lesion of undetermined significance (Bethesda category  III) , Afirma was benign  - She is s/p FNA of the left inferior nodule 5/10/2023with cytology report consistent of follicular lesion of undetermined significance (Bethesda category III) , Afirma was SUSPICIOUS - I had referred her to surgery in June, 2023 but she has not heard anything . Will refer again   F/U in 4 months  Labs in 8 weeks    Signed electronically by: Lyndle HerrlichAbby Jaralla Damyia Strider, MD  [facility]  [facility] [address] [place], [place] [address] Phone: [phone] FAX: [phone]      CC: Leilani Ableeese, Betti, MD [address] [place] [address] Phone: [phone]  Fax: [phone]   Return to Endocrinology clinic as below: Future Appointments  Date Time Provider Department Center  08/01/2022  1:40 PM Laurey MoraleMcLean, Dalton S, MD [facility] None

## 2022-07-04 NOTE — Patient Instructions (Signed)

## 2022-07-05 MED ORDER — LEVOTHYROXINE SODIUM 150 MCG PO TABS: 150.0000 ug | ORAL_TABLET | Freq: Every day | ORAL | 1 refills | Status: DC

## 2022-07-12 ENCOUNTER — Other Ambulatory Visit (HOSPITAL_COMMUNITY): Payer: Self-pay

## 2022-08-01 ENCOUNTER — Encounter (HOSPITAL_COMMUNITY): Payer: Self-pay | Admitting: Cardiology

## 2022-08-01 ENCOUNTER — Ambulatory Visit (HOSPITAL_COMMUNITY)
Admission: RE | Admit: 2022-08-01 | Discharge: 2022-08-01 | Disposition: A | Discharge status: Home / Self Care | Payer: Medicaid Other | Source: Ambulatory Visit | Attending: Cardiology | Admitting: Cardiology

## 2022-08-01 VITALS — BP 100/68 | HR 96 | Wt 179.4 lb

## 2022-08-01 DIAGNOSIS — R9431 Abnormal electrocardiogram [ECG] [EKG]: Secondary | ICD-10-CM | POA: Diagnosis not present

## 2022-08-01 DIAGNOSIS — I509 Heart failure, unspecified: Secondary | ICD-10-CM | POA: Diagnosis not present

## 2022-08-01 DIAGNOSIS — I2721 Secondary pulmonary arterial hypertension: Secondary | ICD-10-CM | POA: Diagnosis present

## 2022-08-01 DIAGNOSIS — E039 Hypothyroidism, unspecified: Secondary | ICD-10-CM | POA: Insufficient documentation

## 2022-08-01 DIAGNOSIS — B1920 Unspecified viral hepatitis C without hepatic coma: Secondary | ICD-10-CM | POA: Diagnosis not present

## 2022-08-01 DIAGNOSIS — R220 Localized swelling, mass and lump, head: Secondary | ICD-10-CM | POA: Insufficient documentation

## 2022-08-01 DIAGNOSIS — Z79890 Hormone replacement therapy (postmenopausal): Secondary | ICD-10-CM | POA: Diagnosis not present

## 2022-08-01 DIAGNOSIS — Z79899 Other long term (current) drug therapy: Secondary | ICD-10-CM | POA: Diagnosis not present

## 2022-08-01 DIAGNOSIS — F17200 Nicotine dependence, unspecified, uncomplicated: Secondary | ICD-10-CM | POA: Diagnosis not present

## 2022-08-01 DIAGNOSIS — I272 Pulmonary hypertension, unspecified: Secondary | ICD-10-CM | POA: Diagnosis not present

## 2022-08-01 DIAGNOSIS — G8929 Other chronic pain: Secondary | ICD-10-CM | POA: Insufficient documentation

## 2022-08-01 DIAGNOSIS — E041 Nontoxic single thyroid nodule: Secondary | ICD-10-CM | POA: Diagnosis not present

## 2022-08-01 DIAGNOSIS — I110 Hypertensive heart disease with heart failure: Secondary | ICD-10-CM | POA: Diagnosis present

## 2022-08-01 DIAGNOSIS — F1120 Opioid dependence, uncomplicated: Secondary | ICD-10-CM | POA: Insufficient documentation

## 2022-08-01 LAB — BASIC METABOLIC PANEL
Anion gap: 7 (ref 5–15)
BUN: 12 mg/dL (ref 6–20)
CO2: 26 mmol/L (ref 22–32)
Calcium: 9.1 mg/dL (ref 8.9–10.3)
Chloride: 105 mmol/L (ref 98–111)
Creatinine, Ser: 1.15 mg/dL — ABNORMAL HIGH (ref 0.44–1.00)
GFR, Estimated: >60 mL/min (ref >60)
Glucose, Bld: 81 mg/dL (ref 70–99)
Potassium: 3.7 mmol/L (ref 3.5–5.1)
Sodium: 138 mmol/L (ref 135–145)

## 2022-08-01 LAB — BRAIN NATRIURETIC PEPTIDE: B Natriuretic Peptide: 10.6 pg/mL (ref 0.0–100.0)

## 2022-08-01 MED ORDER — TADALAFIL (PAH) 20 MG PO TABS: 40.0000 mg | ORAL_TABLET | Freq: Every day | ORAL | 11 refills | Status: DC

## 2022-08-01 MED ORDER — BUPROPION HCL ER (SR) 150 MG PO TB12: 150.0000 mg | ORAL_TABLET | Freq: Two times a day (BID) | ORAL | 3 refills | Status: DC

## 2022-08-01 NOTE — Progress Notes (Signed)
PCP: Leilani Able, MD Cardiology: Dr. Shirlee Latch  31 y.o. with history of pulmonary hypertension and systemic hypertension presents for hospital followup after recent admission with RV failure. Patient also has prior history of IV drug use and COVID-19 infection roughly 1 year ago.  She was referred to general cardiology in 11/21 for evaluation of hypertension and abnormal EKG with findings consistent with right ventricular hypertrophy and RV strain.  She was initially placed on losartan for systemic hypertension but this was discontinued due to facial swelling concerning for angioedema. She was tried on diltiazem CD but also had trouble with this medication as well as Toprol XL.  She also complained of persistent dyspnea that had been present over the last year, following her COVID-19 infection.  Dyspnea gradually worsened over time to the point where it was present with minimal exertion.  Echo was done at the [facility] office in 11/21, showing EF 50-55%, mild LVH, D-shaped septum, severe RV enlargement, moderately decreased RV systolic function, moderate-severe TR, PASP 109, severe RAE.  Based on these findings, she was admitted to [facility] for evaluation of RV failure.    She was volume overloaded on exam and was diuresed with IV Lasix with significant improvement in her breathing.  RHC showed severe pulmonary arterial hypertension and low cardiac output.  V/Q scan and CTA chest were not suggestive of acute or chronic PE, PFTs were normal. Rheumatologic serologies and HIV were negative.  She was started on ambrisentan and tadalafil in the hospital.   In terms of her systemic HTN, were were concerned about secondary causes given her young age.  Renal artery dopplers did not show significant stenosis (worried about FMD), renin/aldosterone ratio was normal.  Plasma normetanephrine was elevated but 24 hour urine catecholamines were normal. She saw an endocrinologist and is not thought to have pheochromocytoma.    HCV antibody was normal but RNA not detected.  Abdominal US showed normal liver and LFTs were normal. She has had exposure to HCV in the past and used to use IV drugs.   She had an urticarial rash in the hospital of uncertain etiology (was present at admission), she got Solumedrol and this has resolved.   Echo 2/22 showed EF 60-65%, moderate RV dilation with moderately decreased systolic function, D-shaped septum, PASP 53 mmHg (down from 109 mmHg on last echo), normal IVC.   Echo in 2/23 showed EF 55-60%, mild RV enlargement with normal RV systolic function, PASP 31.5 mmHg, normal IVC.   Here today for follow up of pulmonary hypertension. She increased Uptravi back to 1400 mcg bid and cannot get any higher due to headaches.  She has been compliant with monthly home pregnancy tests.  She still gets fatigued easily but she denies exertional dyspnea or chest pain.  No lightheadedness or palpitations.  No orthopnea/PND. She does hot yoga for exercise. She is still smoking.   6 minute walk (11/21): 427 meters 6 minute walk (12/21): 396.2 meters 6 minute walk (5/22): 518 meters 6 minute walk (11/22): 549 meters 6 minute walk (2/23): 457 meters (but she was in flip flops) 6 minute walk (6/23): 518 meters  Labs (11/21): K 5, creatinine 1.16, RF mildly elevated 14, anti-CCP negative, HCV ab+ but RNA not detected, EBV IgG+ but IgM-, LFTs normal, TSH 22 with free T3 and free T4 normal, anti-SCL70 negative, anti-centromere negative, plasma normetanephrine elevated but 24 hour urine catecholeamines normal, PRA/PAC normal, ANA negative, HIV negative, BNP 905.  Labs (12/21): K 3.7, creatinine 1.0, Digoxin  0.6, BNP 40, serum pregnancy test: negative Labs (4/22): K 3.6, creatinine 1.02 Labs (5/22): digoxin 0.3, BNP 32, K 3.6, creatinine 1.15 Labs (2/23): K 4.2, creatinine 0.98, BNP 12.9 Labs (6/23): K 4.3, creatinine 1.2, BNP 12.5  PMH: 1. HTN - Renal artery dopplers without hemodynamically significant  stenosis.  - Serum normetanephrine elevated but 24 hour urine catecholamines normal => saw endocrinologist, not thought to have pheochromocytoma.  - PRA/PAC ration normal.  2. Hypothyroidism 3. Prior h/o IVDU 4. Active smoker 5. Pulmonary hypertension: Suspect primary pulmonary hypertension.  - RHC (11/21): mean RA 6, PA 104/56 mean 74, mean PCWP 12, CI 1.41, PVR 23 - Echo (11/21): EF 50-55%, mild LVH, D-shaped septum, severe RV enlargement, moderately decreased RV systolic function, moderate-severe TR, PASP 109, severe RAE.  - V/Q scan (11/21) negative - CTA chest (11/21): No PE or ILD.  - PFTs (11/21): normal - Rheumatologic serologic workup negative, LFTs normal, HIV negative.  - Echo (2/22): EF 60-65%, moderate RV dilation with moderately decreased systolic function (RV EF 36%), D-shaped septum, PASP 53 mmHg (down from 109 mmHg on last echo), normal IVC.  - Negative sleep study.  - Echo (2/23): EF 55-60%, mild RV enlargement with normal RV systolic function, PASP 31.5 mmHg, normal IVC. 6. HCV antibody positive: RNA not detected.  Abdominal US showed normal liver.  7. COVID-19 infection 2020 8. Thyroid nodule  SH: Married, current smoker, prior IVDU. Occasional ETOH.   Family History  Problem Relation Age of Onset   Hypertension Paternal Grandfather    Hypertension Mother    ROS: All systems reviewed and negative except as per HPI.   Current Outpatient Medications  Medication Sig Dispense Refill   ALPRAZolam (XANAX) 1 MG tablet Take 1 mg by mouth 3 (three) times daily.     ambrisentan (LETAIRIS) 10 MG tablet Take 1 tablet (10 mg total) by mouth daily. 30 tablet 11   amLODipine (NORVASC) 5 MG tablet Take 1 tablet (5 mg total) by mouth daily. 90 tablet 3   cetirizine (ZYRTEC) 10 MG tablet Take 10 mg by mouth daily.     cyclobenzaprine (FLEXERIL) 10 MG tablet Take 10 mg by mouth 3 (three) times daily.     furosemide (LASIX) 40 MG tablet Take 0.5 tablets (20 mg total) by mouth  daily. 180 tablet 1   ibuprofen (ADVIL) 800 MG tablet Take 400 mg by mouth every 6 (six) hours as needed for moderate pain.      lansoprazole (PREVACID) 15 MG capsule Take 15 mg by mouth daily.     levonorgestrel (MIRENA) 20 MCG/24HR IUD 1 each by Intrauterine route once.     levothyroxine (SYNTHROID) 150 MCG tablet Take 1 tablet (150 mcg total) by mouth daily. 90 tablet 1   montelukast (SINGULAIR) 10 MG tablet Take 10 mg by mouth daily.     Selexipag (UPTRAVI) 1400 MCG TABS Take 1,400 mcg by mouth in the morning and at bedtime. 60 tablet 11   buPROPion (WELLBUTRIN SR) 150 MG 12 hr tablet Take 1 tablet (150 mg total) by mouth 2 (two) times daily. 180 tablet 3   tadalafil, PAH, (ADCIRCA) 20 MG tablet Take 2 tablets (40 mg total) by mouth daily. 60 tablet 11   No current facility-administered medications for this encounter.    BP 100/68   Pulse 96   Wt 81.4 kg (179 lb 6.4 oz)   SpO2 97%   BMI 29.85 kg/m    Wt Readings from Last 3 Encounters:  08/01/22  81.4 kg (179 lb 6.4 oz)  07/04/22 84.4 kg (186 lb)  02/06/22 81 kg (178 lb 9.6 oz)  General: NAD Neck: No JVD, no thyromegaly or thyroid nodule.  Lungs: Clear to auscultation bilaterally with normal respiratory effort. CV: Nondisplaced PMI.  Heart regular S1/S2, no S3/S4, no murmur.  No peripheral edema.  No carotid bruit.  Normal pedal pulses.  Abdomen: Soft, nontender, no hepatosplenomegaly, no distention.  Skin: Intact without lesions or rashes.  Neurologic: Alert and oriented x 3.  Psych: Normal affect. Extremities: No clubbing or cyanosis.  HEENT: Normal.   Assessment/Plan:  1. Pulmonary hypertension/RV failure: Echo 11/21 with normal LV size with mild LV hypertrophy, EF 50-55%,  D-shaped septum due to RV pressure/volume overload, severely dilated RV with moderately decreased RV systolic function, PASP 109 mmHg, dilated IVC. She had been symptomatic for months prior to this, suspect due to gradually worsening RV failure.  CTA  chest did not show significant lung pathology and did not show PE and V/Q scan negative. PFTs normal. No diet/weight loss drugs.  HIV negative and rheumatologic serologies negative.  HCV antibody positive but liver normal on [place] and LFTs normal.  RHC in 11/21 showed severe pulmonary arterial hypertension with low cardiac output and normal filling pressures.  She will need aggressive treatment of PAH and given low output, may need IV therapy eventually.  Echo in 2/22 showed moderate RV dilation with moderate RV dysfunction, but PASP down to 53 mmHg (109 mmHg on prior echo).  Echo in 2/23 with further improvement, EF 55-60%, mild RV enlargement with normal RV systolic function, PASP 31.5 mmHg, normal IVC.  Suspect primary pulmonary hypertension. Sleep study negative. She is doing well, NYHA class I.  She is not volume overloaded.  - Continue Lasix 20 mg daily, does not want to stop it due to occasional "swelling."  BMET/BNP today.      - Continue tadalafil 40 mg daily. - Continue ambrisentan 10 mg daily today.  She has IUD and gets monthly pregnancy tests.  - Selexipag 1400 mcg bid.  Unable to take higher dose due to headaches and diarrhea.   - Repeat echo and 6 minute walk at followup appt.  2. Systemic HTN: Patient has had systemic HTN for some time.  She has a strong family history of this. She thinks losartan and diltiazem caused facial swelling so has stopped them.  She thinks metoprolol caused a rash.  Concern for secondary HTN given young age (and family history).  Renal artery dopplers in 11/21 without evidence for hemodynamically significant stenosis.  Plasma renin and aldosterone ratio normal.  Serum normetanephrine elevated at 213 pg/mL with normal metanephrine.  This can be a false positive.  Since 24 hour urine catecholamines were negative, suspect not pheochromocytoma. BP now controlled.    - Continue amlodipine to 5 mg daily. 3. Hypothyroidism: Continue Levoxyl, followed by endocrinology.  Has  thyroid nodule.  4. 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 Says she has quit.  5. Hep C: HCV ab reactive. H/o IVDU and has had prior exposure to HCV. Abdominal US without cirrhosis and LFTs normal.  HCV RNA was negative => suspect infection was cleared.  6. Fatigue/sleepiness: Negative sleep study. 7. Active smoker: I strongly encouraged her to quit.  - She is going to try Wellbutrin again.  She says it helped the first time but she was not ready to quit at that time.   Followup in 4 months with echo.   Samina Weekes  Shirlee LatchMcLean 08/01/2022

## 2022-08-01 NOTE — Patient Instructions (Signed)
START Wellbutrin 150mg  daily for 3 days then 150mg  Twice daily  Labs done today, your results will be available in MyChart, we will contact you for abnormal readings.  Your physician has requested that you have an echocardiogram. Echocardiography is a painless test that uses sound waves to create images of your heart. It provides your doctor with information about the size and shape of your heart and how well your heart's chambers and valves are working. This procedure takes approximately one hour. There are no restrictions for this procedure. Please do NOT wear cologne, perfume, aftershave, or lotions (deodorant is allowed). Please arrive 15 minutes prior to your appointment time.  Your physician recommends that you schedule a follow-up appointment in: 4 months with an echocardiogram ( April 2024)  ** please call the office in February to arrange your follow up appointment **  If you have any questions or concerns before your next appointment please send 1995 a message through [place] or call our office at [phone].    TO LEAVE A MESSAGE FOR THE NURSE SELECT OPTION 2, PLEASE LEAVE A MESSAGE INCLUDING: YOUR NAME DATE OF BIRTH CALL BACK NUMBER REASON FOR CALL**this is important as we prioritize the call backs  YOU WILL RECEIVE A CALL BACK THE SAME DAY AS LONG AS YOU CALL BEFORE 4:00 PM  At the [facility], you and your health needs are our priority. As part of our continuing mission to provide you with exceptional heart care, we have created designated Provider Care Teams. These Care Teams include your primary Cardiologist (physician) and Advanced Practice Providers (APPs- Physician Assistants and Nurse Practitioners) who all work together to provide you with the care you need, when you need it.   You may see any of the following providers on your designated Care Team at your next follow up: Dr [address] [phone] Dr. Arvilla Meres,  NP Marca Ancona, Marcos Eke [facility], [facility] [place], NP [place], PharmD   Please be sure to bring in all your medications bottles to every appointment.

## 2022-08-13 ENCOUNTER — Other Ambulatory Visit: Payer: Self-pay | Admitting: Internal Medicine

## 2022-08-29 ENCOUNTER — Telehealth: Payer: Self-pay | Admitting: Internal Medicine

## 2022-08-29 ENCOUNTER — Other Ambulatory Visit (INDEPENDENT_AMBULATORY_CARE_PROVIDER_SITE_OTHER): Payer: Medicaid Other

## 2022-08-29 DIAGNOSIS — E042 Nontoxic multinodular goiter: Secondary | ICD-10-CM | POA: Diagnosis not present

## 2022-08-29 NOTE — Telephone Encounter (Signed)
Patient came in for labs on 08/29/22 and asked that I relay to her provider and medical assistance that her last referral was not accepted by the receiving office.  She would like another referral to be paced.  Contact number is [phone]

## 2022-08-30 LAB — T4, FREE: Free T4: 1.39 ng/dL (ref 0.60–1.60)

## 2022-08-30 LAB — TSH: TSH: 4.07 u[IU]/mL (ref 0.35–5.50)

## 2022-11-08 ENCOUNTER — Ambulatory Visit (INDEPENDENT_AMBULATORY_CARE_PROVIDER_SITE_OTHER): Payer: Medicaid Other | Admitting: Internal Medicine

## 2022-11-08 ENCOUNTER — Encounter: Payer: Self-pay | Admitting: Internal Medicine

## 2022-11-08 VITALS — BP 110/74 | HR 93 | Ht 65.0 in | Wt 180.0 lb

## 2022-11-08 DIAGNOSIS — E042 Nontoxic multinodular goiter: Secondary | ICD-10-CM

## 2022-11-08 DIAGNOSIS — E039 Hypothyroidism, unspecified: Secondary | ICD-10-CM

## 2022-11-08 LAB — TSH: TSH: 1.81 u[IU]/mL (ref 0.35–5.50)

## 2022-11-08 MED ORDER — LEVOTHYROXINE SODIUM 150 MCG PO TABS
150.0000 ug | ORAL_TABLET | Freq: Every day | ORAL | 3 refills | Status: AC
Start: 2022-11-08 — End: ?

## 2022-11-08 NOTE — Progress Notes (Signed)
Name: Zoe Roach  MRN/ DOB: [id number], 06/03/1991    Age/ Sex: 32 y.o., female     PCP: Lin Landsman, MD   Reason for Endocrinology Evaluation: Hypothyroidism     Initial Endocrinology Clinic Visit: 08/25/2020    PATIENT IDENTIFIER: Zoe Roach is a 32 y.o., female with a past medical history of HTN , IV drug abuse, Pulmonary HTN and RV failure. She has followed with [facility] since 08/25/2020 for consultative assistance with management of her Elevated catecholamines   HISTORICAL SUMMARY:   During evaluation for HTN/ Pulmonary hypertension and RV failure she was noted to have a slightly elevated (06/2020)  plasma  normetanephrine's 213.9 with normal plasma metanephrine.  Urinary dopamine, epinephrines, metanephrines and normetanephrines have all come back normal with slight elevation of norepinephrine at 141  ug ( reference 0-135 )   Aldo and renin are normal at 2.8 and 1.031 respectively.  Repeat 24-hr urine catecholamine and cortisol were all normal by 08/2020     THYROID HISTORY:   She has been diagnosed with hypothyroidism in the summer of 2021 , she was started On  levothyroxine at the time.    She was subsequently diagnosed with multinodular goiter in April 2023.  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 [name] is here for hypothyroidism and MNG   She had been referred to surgery for surgical intervention of a suspicious thyroid nodule.   Dr. Ileene Hutchinson office contacted her and were waiting on a call back from her.   She continues to follow with Cardiology for pulmonary HTN Denies local neck swelling  Denies constipation or diarrhea  Denies palpitations  Denies tremors     Levothyroxine 150 mcg daily     HISTORY:  Past Medical History:  Past Medical History:  Diagnosis Date   CHF (congestive heart failure) ([place])    History of COVID-19    Hypothyroidism (acquired)    IVDU (intravenous drug user)    SOB (shortness of breath)    Past Surgical History:  Past Surgical History:  Procedure Laterality Date   CESAREAN SECTION     RIGHT HEART CATH N/A 06/21/2020   Procedure: RIGHT HEART CATH;  Surgeon: Larey Dresser, MD;  Location: [facility];  Service: Cardiovascular;  Laterality: N/A;   Social History:  reports that she has been smoking cigarettes. She has a 7.50 pack-year smoking history. She has never used smokeless tobacco. She reports current alcohol use. She reports current drug use. Family History:  Family History  Problem Relation Age of Onset   Hypertension Paternal Grandfather    Hypertension Mother      HOME MEDICATIONS: Allergies as of 11/08/2022       Reactions   Almond (diagnostic) Hives   Cherry Hives   Diltiazem Other (See Comments)   Swollen face   Losartan Swelling   Peach [prunus Persica] Hives   Metoprolol Rash        Medication List        Accurate as of November 08, 2022  7:31 AM. If you have any questions, ask your nurse or doctor.          ALPRAZolam 1 MG tablet Commonly known as: XANAX Take 1 mg by mouth 3 (three) times daily.   ambrisentan 10 MG tablet Commonly known as: LETAIRIS Take 1 tablet (10 mg total) by mouth daily.   amLODipine 5 MG tablet Commonly known as: NORVASC Take 1 tablet (5 mg total) by mouth daily.   buPROPion 150 MG 12 hr tablet Commonly known as: Wellbutrin SR Take 1 tablet (150 mg total) by mouth 2 (two) times daily.    cetirizine 10 MG tablet Commonly known as: ZYRTEC Take 10 mg by mouth daily.   cyclobenzaprine 10 MG tablet Commonly known as: FLEXERIL Take 10 mg by mouth 3 (three) times daily.   furosemide 40 MG tablet Commonly known as: LASIX Take 0.5 tablets (20 mg total) by mouth daily.   ibuprofen 800 MG tablet Commonly known as: ADVIL Take 400 mg by mouth every 6 (six) hours as needed for moderate pain.   lansoprazole 15 MG capsule Commonly known as: PREVACID Take 15 mg by mouth daily.   levonorgestrel 20 MCG/24HR IUD Commonly known as: MIRENA 1 each by Intrauterine route once.   levothyroxine 150 MCG tablet Commonly known as: SYNTHROID Take 1 tablet (150 mcg total) by mouth daily.   montelukast 10 MG tablet Commonly known as: SINGULAIR Take 10 mg by mouth daily.   tadalafil (PAH) 20 MG tablet Commonly known as: ADCIRCA Take 2 tablets (40 mg total) by mouth daily.   Uptravi 1400 MCG Tabs Generic drug: Selexipag Take 1,400 mcg by mouth in the morning and at bedtime.          OBJECTIVE:   PHYSICAL EXAM: VS: There were no vitals taken for this visit.   EXAM: General: Pt appears well and is in NAD  Neck: General: Supple without adenopathy. Thyroid: Thyroid size normal.  No goiter or nodules appreciated. No thyroid bruit.  Lungs: Clear with good BS bilat with no rales, rhonchi, or wheezes  Heart: Auscultation: RRR.  Abdomen: Normoactive bowel sounds, soft, nontender, without masses or organomegaly palpable  Extremities:  BL LE: No pretibial edema normal ROM and strength.     DATA REVIEWED:  Latest Reference Range & Units 11/08/22 10:11  TSH 0.35 - 5.50 uIU/mL 1.81    Thyroid ultrasound 12/06/2021  Estimated total number of nodules >/= 1 cm: 4   Number of spongiform nodules >/=  2 cm not described below (TR1): 0   Number of mixed cystic and solid nodules >/= 1.5 cm not described below ([place]): 0   _________________________________________________________    Nodule # 1:   Location: Right; Inferior   Maximum size: 2.0 cm; Other 2 dimensions: 2.0 x 1.5 cm   Composition: solid/almost completely solid (2)   Echogenicity: isoechoic (1)   Shape: taller-than-wide (3)   Margins: ill-defined (0)   Echogenic foci: none (0)   ACR TI-RADS total points: 6.   ACR TI-RADS risk category: TR4 (4-6 points).   ACR TI-RADS recommendations:   **Given size (>/= 1.5 cm) and appearance, fine needle aspiration of this moderately suspicious nodule should be considered based on TI-RADS criteria.   _________________________________________________________   Nodule # 2:   Location: Left; Superior   Maximum size: 1.7 cm; Other 2 dimensions: 1.6 x 1.3 cm   Composition: mixed cystic and solid (1)   Echogenicity: isoechoic (1)   Shape: not taller-than-wide (0)  Margins: ill-defined (0)   Echogenic foci: none (0)   ACR TI-RADS total points: 2.   ACR TI-RADS risk category: TR2 (2 points).   ACR TI-RADS recommendations:   This nodule does NOT meet TI-RADS criteria for biopsy or dedicated follow-up.   _________________________________________________________   Nodule # 3:   Location: Left; Superior   Maximum size: 1.2 cm; Other 2 dimensions: 1.0 x 0.7 cm   Composition: solid/almost completely solid (2)   Echogenicity: hypoechoic (2)   Shape: not taller-than-wide (0)   Margins: ill-defined (0)   Echogenic foci: none (0)   ACR TI-RADS total points: 4.   ACR TI-RADS risk category: TR4 (4-6 points).   ACR TI-RADS recommendations:   *Given size (>/= 1 - 1.4 cm) and appearance, a follow-up ultrasound in 1 year should be considered based on TI-RADS criteria.   _________________________________________________________   Nodule # 4:   Location: Left; Inferior   Maximum size: 2.0 cm; Other 2 dimensions: 1.5 x 0.9 cm   Composition: solid/almost completely solid (2)   Echogenicity: hypoechoic (2)   Shape: not  taller-than-wide (0)   Margins: ill-defined (0)   Echogenic foci: none (0)   ACR TI-RADS total points: 4.   ACR TI-RADS risk category: TR4 (4-6 points).   ACR TI-RADS recommendations:   **Given size (>/= 1.5 cm) and appearance, fine needle aspiration of this moderately suspicious nodule should be considered based on TI-RADS criteria.   _________________________________________________________   Nonspecific thyroid heterogeneity and hypervascularity noted. No regional adenopathy.   IMPRESSION: 2 cm right inferior TR 4 nodule and 2 cm left inferior TR 4 nodule. Both meet criteria for biopsy as above. (Nodules 1 and 4)   1.2 cm left 2 superior thyroid TR 4 nodule meets criteria follow-up in 1 year.     FNA left inferior nodule 12/21/2021  Clinical History: Nodule #4: Left; Inferior, Maximum size: 2.0 cm; Other  2 dimensions: 1.5 x 0.9 cm, solid/almost completely solid, hypoechoic,  TI-RADS total points: 4.  Specimen Submitted:  A. 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 Specimen Submitted:  A. 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 MNG:   - No local neck symptoms  - She is s/p FNA of right inferior nodule  12/21/2021 with cytology report consistent of follicular lesion of undetermined significance (Bethesda category  III) , Afirma was benign  - She is s/p FNA of the left inferior nodule 5/10/2023with cytology report consistent of follicular lesion of undetermined significance (Bethesda category III) , Afirma was SUSPICIOUS - I had referred her to surgery in June, 2023 , there was a note in the chart that Dr. Ileene Hutchinson office has contacted her and while waiting for response from her, patient did not get this message, the patient was provided with his contact information to schedule an appointment -I will proceed with repeat thyroid ultrasound  as it has been almost a year  F/U in 6 months     Signed electronically by: Mack Guise, MD  [facility]  [facility] [address] [place],  [address] Phone: [phone] FAX: [phone]      CC: Lin Landsman, [place] [address] Phone: [phone]  Fax: [phone]   Return to Endocrinology clinic as below: Future Appointments  Date Time Provider [place]  11/08/2022  9:50 AM Rayen Dafoe, Melanie Crazier, MD [facility] None  11/23/2022  2:00 PM [facility] ECHO OP 1 [facility]  11/23/2022  3:20 PM Larey Dresser, MD [facility] None

## 2022-11-08 NOTE — Patient Instructions (Addendum)
Please contact Dr. Ileene Hutchinson Office to schedule a consultation    Address: [address] [place], [place], [place] [address] Phone: [phone]

## 2022-11-23 ENCOUNTER — Ambulatory Visit (HOSPITAL_COMMUNITY)
Admission: RE | Admit: 2022-11-23 | Discharge: 2022-11-23 | Disposition: A | Discharge status: Home / Self Care | Payer: Medicaid Other | Source: Ambulatory Visit | Attending: Family Medicine | Admitting: Family Medicine

## 2022-11-23 ENCOUNTER — Ambulatory Visit (HOSPITAL_BASED_OUTPATIENT_CLINIC_OR_DEPARTMENT_OTHER)
Admission: RE | Admit: 2022-11-23 | Discharge: 2022-11-23 | Disposition: A | Discharge status: Home / Self Care | Payer: Medicaid Other | Source: Ambulatory Visit | Attending: Cardiology | Admitting: Cardiology

## 2022-11-23 VITALS — BP 100/70 | HR 78 | Wt 178.0 lb

## 2022-11-23 DIAGNOSIS — I110 Hypertensive heart disease with heart failure: Secondary | ICD-10-CM | POA: Insufficient documentation

## 2022-11-23 DIAGNOSIS — Z79890 Hormone replacement therapy (postmenopausal): Secondary | ICD-10-CM | POA: Diagnosis not present

## 2022-11-23 DIAGNOSIS — Z79899 Other long term (current) drug therapy: Secondary | ICD-10-CM | POA: Diagnosis not present

## 2022-11-23 DIAGNOSIS — B1920 Unspecified viral hepatitis C without hepatic coma: Secondary | ICD-10-CM | POA: Insufficient documentation

## 2022-11-23 DIAGNOSIS — E042 Nontoxic multinodular goiter: Secondary | ICD-10-CM | POA: Diagnosis not present

## 2022-11-23 DIAGNOSIS — I272 Pulmonary hypertension, unspecified: Secondary | ICD-10-CM

## 2022-11-23 DIAGNOSIS — R5383 Other fatigue: Secondary | ICD-10-CM | POA: Diagnosis not present

## 2022-11-23 DIAGNOSIS — Z793 Long term (current) use of hormonal contraceptives: Secondary | ICD-10-CM | POA: Diagnosis not present

## 2022-11-23 DIAGNOSIS — Z8616 Personal history of COVID-19: Secondary | ICD-10-CM | POA: Insufficient documentation

## 2022-11-23 DIAGNOSIS — F17200 Nicotine dependence, unspecified, uncomplicated: Secondary | ICD-10-CM | POA: Diagnosis not present

## 2022-11-23 DIAGNOSIS — I445 Left posterior fascicular block: Secondary | ICD-10-CM | POA: Insufficient documentation

## 2022-11-23 DIAGNOSIS — G8929 Other chronic pain: Secondary | ICD-10-CM | POA: Diagnosis not present

## 2022-11-23 DIAGNOSIS — I509 Heart failure, unspecified: Secondary | ICD-10-CM | POA: Insufficient documentation

## 2022-11-23 DIAGNOSIS — E039 Hypothyroidism, unspecified: Secondary | ICD-10-CM | POA: Insufficient documentation

## 2022-11-23 DIAGNOSIS — F1121 Opioid dependence, in remission: Secondary | ICD-10-CM | POA: Insufficient documentation

## 2022-11-23 DIAGNOSIS — Z8249 Family history of ischemic heart disease and other diseases of the circulatory system: Secondary | ICD-10-CM | POA: Insufficient documentation

## 2022-11-23 LAB — ECHOCARDIOGRAM COMPLETE
AR max vel: 3.53 cm2
AV Area VTI: 3.6 cm2
AV Area mean vel: 3.22 cm2
AV Mean grad: 3.5 mmHg
AV Peak grad: 5.1 mmHg
Ao pk vel: 1.13 m/s
Area-P 1/2: 3.27 cm2
Calc EF: 48.1 %
S' Lateral: 3.5 cm
Single Plane A2C EF: 42.5 %
Single Plane A4C EF: 56.3 %

## 2022-11-23 LAB — BRAIN NATRIURETIC PEPTIDE: B Natriuretic Peptide: 8 pg/mL (ref 0.0–100.0)

## 2022-11-23 LAB — BASIC METABOLIC PANEL
Anion gap: 9 (ref 5–15)
BUN: 20 mg/dL (ref 6–20)
CO2: 23 mmol/L (ref 22–32)
Calcium: 9.3 mg/dL (ref 8.9–10.3)
Chloride: 103 mmol/L (ref 98–111)
Creatinine, Ser: 1.16 mg/dL — ABNORMAL HIGH (ref 0.44–1.00)
GFR, Estimated: >60 mL/min (ref >60)
Glucose, Bld: 80 mg/dL (ref 70–99)
Potassium: 4.3 mmol/L (ref 3.5–5.1)
Sodium: 135 mmol/L (ref 135–145)

## 2022-11-23 NOTE — Patient Instructions (Addendum)
6 minute walk done today   Medication Changes:  No medication changes  Lab Work:  Labs done today, your results will be available in MyChart, we will contact you for abnormal readings.  Follow-Up in:   Your physician recommends that you schedule a follow-up appointment in: 3 MONTHS   Do the following things EVERYDAY: Weigh yourself in the morning before breakfast. Write it down and keep it in a log. Take your medicines as prescribed Eat low salt foods--Limit salt (sodium) to 2000 mg per day.  Stay as active as you can everyday Limit all fluids for the day to less than 2 liters     You are scheduled for a Cardiac Catheterization on Friday, April 26 with Dr. Marca Ancona.  1. Please arrive at the [facility] (Main Entrance A) at [facility]: [address] [place], [place] [address] at 11:30 AM (This time is two hours before your procedure to ensure your preparation). Free valet parking service is available. You will check in at ADMITTING. The support person will be asked to wait in the waiting room.  It is OK to have someone drop you off and come back when you are ready to be discharged.    Special note: Every effort is made to have your procedure done on time. Please understand that emergencies sometimes delay scheduled procedures.  2. Diet: Do not eat solid foods after midnight.  The patient may have clear liquids until 5am upon the day of the procedure.  4. Medication instructions in preparation for your procedure: DO NOT TAKE LASIX THE DAY OF THE PROCEDURE.   On the morning of your procedure, take any morning medicines NOT listed above.  You may use sips of water.  5. Plan to go home the same day, you will only stay overnight if medically necessary. 6. Bring a current list of your medications and current insurance cards. 7. You MUST have a responsible person to drive you home. 8. Someone MUST be with you the first 24 hours after you arrive home or your discharge  will be delayed. 9. Please wear clothes that are easy to get on and off and wear slip-on shoes.  Thank you for allowing [place] to care for you!   -- [facility]    Need to Contact us:  If you have any questions or concerns before your next appointment please send [place] a message through [place] or call our office at [phone].    TO LEAVE A MESSAGE FOR THE NURSE SELECT OPTION 2, PLEASE LEAVE A MESSAGE INCLUDING: YOUR NAME DATE OF BIRTH CALL BACK NUMBER REASON FOR CALL**this is important as we prioritize the call backs  YOU WILL RECEIVE A CALL BACK THE SAME DAY AS LONG AS YOU CALL BEFORE 4:00 PM   At the [facility], you and your health needs are our priority. As part of our continuing mission to provide you with exceptional heart care, we have created designated Provider Care Teams. These Care Teams include your primary Cardiologist (physician) and Advanced Practice Providers (APPs- Physician Assistants and Nurse Practitioners) who all work together to provide you with the care you need, when you need it.   You may see any of the following providers on your designated Care Team at your next follow up: Dr Arvilla Meres Dr Marca Ancona Dr. Marcos Eke, NP Robbie Lis, [place] [facility] [place], [place] Brynda Peon, NP Karle Plumber, PharmD   Please be  sure to bring in all your medications bottles to every appointment.    Thank you for choosing [facility]

## 2022-11-23 NOTE — Progress Notes (Addendum)
6 Min Walk Test Completed  Pt ambulated 213.36 O2 Sat ranged 99 on room air HR ranged 94

## 2022-11-24 ENCOUNTER — Other Ambulatory Visit (HOSPITAL_COMMUNITY): Payer: Self-pay

## 2022-11-24 DIAGNOSIS — I272 Pulmonary hypertension, unspecified: Secondary | ICD-10-CM

## 2022-11-24 NOTE — Progress Notes (Signed)
PCP: Leilani Able, MD Cardiology: Dr. Shirlee Latch  32 y.o. with history of pulmonary hypertension and systemic hypertension presents for hospital followup after recent admission with RV failure. Patient also has prior history of IV drug use and COVID-19 infection roughly 1 year ago.  She was referred to general cardiology in 11/21 for evaluation of hypertension and abnormal EKG with findings consistent with right ventricular hypertrophy and RV strain.  She was initially placed on losartan for systemic hypertension but this was discontinued due to facial swelling concerning for angioedema. She was tried on diltiazem CD but also had trouble with this medication as well as Toprol XL.  She also complained of persistent dyspnea that had been present over the last year, following her COVID-19 infection.  Dyspnea gradually worsened over time to the point where it was present with minimal exertion.  Echo was done at the [facility] office in 11/21, showing EF 50-55%, mild LVH, D-shaped septum, severe RV enlargement, moderately decreased RV systolic function, moderate-severe TR, PASP 109, severe RAE.  Based on these findings, she was admitted to [facility] for evaluation of RV failure.    She was volume overloaded on exam and was diuresed with IV Lasix with significant improvement in her breathing.  RHC showed severe pulmonary arterial hypertension and low cardiac output.  V/Q scan and CTA chest were not suggestive of acute or chronic PE, PFTs were normal. Rheumatologic serologies and HIV were negative.  She was started on ambrisentan and tadalafil in the hospital.   In terms of her systemic HTN, were were concerned about secondary causes given her young age.  Renal artery dopplers did not show significant stenosis (worried about FMD), renin/aldosterone ratio was normal.  Plasma normetanephrine was elevated but 24 hour urine catecholamines were normal. She saw an endocrinologist and is not thought to have pheochromocytoma.    HCV antibody was normal but RNA not detected.  Abdominal US showed normal liver and LFTs were normal. She has had exposure to HCV in the past and used to use IV drugs.   She had an urticarial rash in the hospital of uncertain etiology (was present at admission), she got Solumedrol and this has resolved.   Echo 2/22 showed EF 60-65%, moderate RV dilation with moderately decreased systolic function, D-shaped septum, PASP 53 mmHg (down from 109 mmHg on last echo), normal IVC.   Echo in 2/23 showed EF 55-60%, mild RV enlargement with normal RV systolic function, PASP 31.5 mmHg, normal IVC.  Echo was done today and reviewed, EF 55-60% with mild RV enlargement but normal RV systolic function, PASP 39 mmHg.    Here today for follow up of pulmonary hypertension. She increased Uptravi back to 1400 mcg bid and cannot get any higher due to headaches.  She has been compliant with monthly home pregnancy tests.  Still smoking, has not tried Chantix.  She is generally very active, cleans houses for work.  Does yoga, goes to gym and uses ellliptical and treadmill.  No significant exertional dyspnea though she gets tired at times. No lightheadedness.  No chest pain. 6 minute walk today was interrupted by workers in [employer] so I do not think it was accurate. Of note, she lowered her tadalafil dose to 20 mg daily.   6 minute walk (11/21): 427 meters 6 minute walk (12/21): 396.2 meters 6 minute walk (5/22): 518 meters 6 minute walk (11/22): 549 meters 6 minute walk (2/23): 457 meters (but she was in flip flops) 6 minute walk (6/23):  518 meters  Labs (11/21): K 5, creatinine 1.16, RF mildly elevated 14, anti-CCP negative, HCV ab+ but RNA not detected, EBV IgG+ but IgM-, LFTs normal, TSH 22 with free T3 and free T4 normal, anti-SCL70 negative, anti-centromere negative, plasma normetanephrine elevated but 24 hour urine catecholeamines normal, PRA/PAC normal, ANA negative, HIV negative, BNP 905.  Labs (12/21): K  3.7, creatinine 1.0, Digoxin 0.6, BNP 40, serum pregnancy test: negative Labs (4/22): K 3.6, creatinine 1.02 Labs (5/22): digoxin 0.3, BNP 32, K 3.6, creatinine 1.15 Labs (2/23): K 4.2, creatinine 0.98, BNP 12.9 Labs (6/23): K 4.3, creatinine 1.2, BNP 12.5 Labs (12/23): BNP 10, K 3.7, creatinine 1.15 Labs (3/24): TSH normal  PMH: 1. HTN - Renal artery dopplers without hemodynamically significant stenosis.  - Serum normetanephrine elevated but 24 hour urine catecholamines normal => saw endocrinologist, not thought to have pheochromocytoma.  - PRA/PAC ration normal.  2. Hypothyroidism 3. Prior h/o IVDU 4. Active smoker 5. Pulmonary hypertension: Suspect primary pulmonary hypertension.  - RHC (11/21): mean RA 6, PA 104/56 mean 74, mean PCWP 12, CI 1.41, PVR 23 - Echo (11/21): EF 50-55%, mild LVH, D-shaped septum, severe RV enlargement, moderately decreased RV systolic function, moderate-severe TR, PASP 109, severe RAE.  - V/Q scan (11/21) negative - CTA chest (11/21): No PE or ILD.  - PFTs (11/21): normal - Rheumatologic serologic workup negative, LFTs normal, HIV negative.  - Echo (2/22): EF 60-65%, moderate RV dilation with moderately decreased systolic function (RV EF 36%), D-shaped septum, PASP 53 mmHg (down from 109 mmHg on last echo), normal IVC.  - Negative sleep study.  - Echo (2/23): EF 55-60%, mild RV enlargement with normal RV systolic function, PASP 31.5 mmHg, normal IVC. - Echo (4/24): EF 55-60% with mild RV enlargement but normal RV systolic function, PASP 39 mmHg.  6. HCV antibody positive: RNA not detected.  Abdominal US showed normal liver.  7. COVID-19 infection 2020 8. Multinodular goiter  SH: Married, current smoker, prior IVDU. Occasional ETOH.   Family History  Problem Relation Age of Onset   Hypertension Paternal Grandfather    Hypertension Mother    ROS: All systems reviewed and negative except as per HPI.   Current Outpatient Medications  Medication Sig  Dispense Refill   ALPRAZolam (XANAX) 1 MG tablet Take 1 mg by mouth 3 (three) times daily.     ambrisentan (LETAIRIS) 10 MG tablet Take 1 tablet (10 mg total) by mouth daily. 30 tablet 11   amLODipine (NORVASC) 5 MG tablet Take 1 tablet (5 mg total) by mouth daily. 90 tablet 3   buPROPion (WELLBUTRIN SR) 150 MG 12 hr tablet Take 1 tablet (150 mg total) by mouth 2 (two) times daily. 180 tablet 3   cetirizine (ZYRTEC) 10 MG tablet Take 10 mg by mouth daily.     cyclobenzaprine (FLEXERIL) 10 MG tablet Take 10 mg by mouth 3 (three) times daily.     furosemide (LASIX) 40 MG tablet Take 0.5 tablets (20 mg total) by mouth daily. 180 tablet 1   ibuprofen (ADVIL) 800 MG tablet Take 400 mg by mouth every 6 (six) hours as needed for moderate pain.      lansoprazole (PREVACID) 15 MG capsule Take 15 mg by mouth daily.     levonorgestrel (MIRENA) 20 MCG/24HR IUD 1 each by Intrauterine route once.     levothyroxine (SYNTHROID) 150 MCG tablet Take 1 tablet (150 mcg total) by mouth daily. 90 tablet 3   montelukast (SINGULAIR) 10 MG tablet Take  10 mg by mouth daily.     Selexipag (UPTRAVI) 1400 MCG TABS Take 1,400 mcg by mouth in the morning and at bedtime. 60 tablet 11   tadalafil, PAH, (ADCIRCA) 20 MG tablet Take 2 tablets (40 mg total) by mouth daily. 60 tablet 11   No current facility-administered medications for this encounter.    BP 100/70   Pulse 78   Wt 80.7 kg (178 lb)   SpO2 97%   BMI 29.62 kg/m    Wt Readings from Last 3 Encounters:  11/23/22 80.7 kg (178 lb)  11/08/22 81.6 kg (180 lb)  08/01/22 81.4 kg (179 lb 6.4 oz)  General: NAD Neck: No JVD, no thyromegaly or thyroid nodule.  Lungs: Clear to auscultation bilaterally with normal respiratory effort. CV: Nondisplaced PMI.  Heart regular S1/S2, no S3/S4, no murmur.  No peripheral edema.  No carotid bruit.  Normal pedal pulses.  Abdomen: Soft, nontender, no hepatosplenomegaly, no distention.  Skin: Intact without lesions or rashes.   Neurologic: Alert and oriented x 3.  Psych: Normal affect. Extremities: No clubbing or cyanosis.  HEENT: Normal.    Assessment/Plan:  1. Pulmonary hypertension/RV failure: Echo 11/21 with normal LV size with mild LV hypertrophy, EF 50-55%,  D-shaped septum due to RV pressure/volume overload, severely dilated RV with moderately decreased RV systolic function, PASP 109 mmHg, dilated IVC. She had been symptomatic for months prior to this, suspect due to gradually worsening RV failure.  CTA chest did not show significant lung pathology and did not show PE and V/Q scan negative. PFTs normal. No diet/weight loss drugs.  HIV negative and rheumatologic serologies negative.  HCV antibody positive but liver normal on [place] and LFTs normal.  RHC in 11/21 showed severe pulmonary arterial hypertension with low cardiac output and normal filling pressures. Echo in 2/22 showed moderate RV dilation with moderate RV dysfunction, but PASP down to 53 mmHg (109 mmHg on prior echo).  Echo in 2/23 with further improvement, EF 55-60%, mild RV enlargement with normal RV systolic function, PASP 31.5 mmHg, normal IVC.  Echo today showed EF 55-60% with mild RV enlargement but normal RV systolic function, PASP 39 mmHg. Suspect primary pulmonary hypertension. Sleep study negative. She is doing well, NYHA class I.  She is not volume overloaded.  - Continue Lasix 20 mg daily, does not want to stop it due to occasional "swelling."  BMET/BNP today.      - Increase tadalafil back to 40 mg daily, has been taking 20 mg for a couple of weeks. - Continue ambrisentan 10 mg daily today.  She has IUD and gets monthly pregnancy tests.  - Selexipag 1400 mcg bid.  Unable to take higher dose due to headaches and diarrhea.   - 6 minute walk today was not accurate due to interruptions, repeat next appt.  - I am going to arrange for repeat RHC to reassess PA pressure and PVR.  We discussed risks/benefits and she agrees to procedure.  If PA pressure  remains elevated, I would like to get her on sotatercept.  2. Systemic HTN: Patient has had systemic HTN for some time.  She has a strong family history of this. She thinks losartan and diltiazem caused facial swelling so has stopped them.  She thinks metoprolol caused a rash.  Concern for secondary HTN given young age (and family history).  Renal artery dopplers in 11/21 without evidence for hemodynamically significant stenosis.  Plasma renin and aldosterone ratio normal.  Serum normetanephrine elevated at 213 pg/mL  with normal metanephrine.  This can be a false positive.  Since 24 hour urine catecholamines were negative, suspect not pheochromocytoma. BP now controlled.    - Continue amlodipine to 5 mg daily. 3. Hypothyroidism: Continue Levoxyl, followed by endocrinology.  Has multinodular goiter.  4. 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 Says she has quit.  5. Hep C: HCV ab reactive. H/o IVDU and has had prior exposure to HCV. Abdominal US without cirrhosis and LFTs normal.  HCV RNA was negative => suspect infection was cleared.  6. Fatigue/sleepiness: Negative sleep study. 7. Active smoker: I strongly encouraged her to quit.  - Has Chantix but has not tried it yet.   Followup in 3 months  Zoe Roach 11/24/2022

## 2022-11-24 NOTE — H&P (View-Only) (Signed)
PCP: Reese, Betti, MD Cardiology: Dr. Jamel Holzmann  32 y.o. with history of pulmonary hypertension and systemic hypertension presents for hospital followup after recent admission with RV failure. Patient also has prior history of IV drug use and COVID-19 infection roughly 1 year ago.  She was referred to general cardiology in 11/21 for evaluation of hypertension and abnormal EKG with findings consistent with right ventricular hypertrophy and RV strain.  She was initially placed on losartan for systemic hypertension but this was discontinued due to facial swelling concerning for angioedema. She was tried on diltiazem CD but also had trouble with this medication as well as Toprol XL.  She also complained of persistent dyspnea that had been present over the last year, following her COVID-19 infection.  Dyspnea gradually worsened over time to the point where it was present with minimal exertion.  Echo was done at the [address] office in 11/21, showing EF 50-55%, mild LVH, D-shaped septum, severe RV enlargement, moderately decreased RV systolic function, moderate-severe TR, PASP 109, severe RAE.  Based on these findings, she was admitted to [facility] for evaluation of RV failure.    She was volume overloaded on exam and was diuresed with IV Lasix with significant improvement in her breathing.  RHC showed severe pulmonary arterial hypertension and low cardiac output.  V/Q scan and CTA chest were not suggestive of acute or chronic PE, PFTs were normal. Rheumatologic serologies and HIV were negative.  She was started on ambrisentan and tadalafil in the hospital.   In terms of her systemic HTN, were were concerned about secondary causes given her young age.  Renal artery dopplers did not show significant stenosis (worried about FMD), renin/aldosterone ratio was normal.  Plasma normetanephrine was elevated but 24 hour urine catecholamines were normal. She saw an endocrinologist and is not thought to have pheochromocytoma.    HCV antibody was normal but RNA not detected.  Abdominal US showed normal liver and LFTs were normal. She has had exposure to HCV in the past and used to use IV drugs.   She had an urticarial rash in the hospital of uncertain etiology (was present at admission), she got Solumedrol and this has resolved.   Echo 2/22 showed EF 60-65%, moderate RV dilation with moderately decreased systolic function, D-shaped septum, PASP 53 mmHg (down from 109 mmHg on last echo), normal IVC.   Echo in 2/23 showed EF 55-60%, mild RV enlargement with normal RV systolic function, PASP 31.5 mmHg, normal IVC.  Echo was done today and reviewed, EF 55-60% with mild RV enlargement but normal RV systolic function, PASP 39 mmHg.    Here today for follow up of pulmonary hypertension. She increased Uptravi back to 1400 mcg bid and cannot get any higher due to headaches.  She has been compliant with monthly home pregnancy tests.  Still smoking, has not tried Chantix.  She is generally very active, cleans houses for work.  Does yoga, goes to gym and uses ellliptical and treadmill.  No significant exertional dyspnea though she gets tired at times. No lightheadedness.  No chest pain. 6 minute walk today was interrupted by workers in the hall so I do not think it was accurate. Of note, she lowered her tadalafil dose to 20 mg daily.   6 minute walk (11/21): 427 meters 6 minute walk (12/21): 396.2 meters 6 minute walk (5/22): 518 meters 6 minute walk (11/22): 549 meters 6 minute walk (2/23): 457 meters (but she was in flip flops) 6 minute walk (6/23):   518 meters  Labs (11/21): K 5, creatinine 1.16, RF mildly elevated 14, anti-CCP negative, HCV ab+ but RNA not detected, EBV IgG+ but IgM-, LFTs normal, TSH 22 with free T3 and free T4 normal, anti-SCL70 negative, anti-centromere negative, plasma normetanephrine elevated but 24 hour urine catecholeamines normal, PRA/PAC normal, ANA negative, HIV negative, BNP 905.  Labs (12/21): K  3.7, creatinine 1.0, Digoxin 0.6, BNP 40, serum pregnancy test: negative Labs (4/22): K 3.6, creatinine 1.02 Labs (5/22): digoxin 0.3, BNP 32, K 3.6, creatinine 1.15 Labs (2/23): K 4.2, creatinine 0.98, BNP 12.9 Labs (6/23): K 4.3, creatinine 1.2, BNP 12.5 Labs (12/23): BNP 10, K 3.7, creatinine 1.15 Labs (3/24): TSH normal  PMH: 1. HTN - Renal artery dopplers without hemodynamically significant stenosis.  - Serum normetanephrine elevated but 24 hour urine catecholamines normal => saw endocrinologist, not thought to have pheochromocytoma.  - PRA/PAC ration normal.  2. Hypothyroidism 3. Prior h/o IVDU 4. Active smoker 5. Pulmonary hypertension: Suspect primary pulmonary hypertension.  - RHC (11/21): mean RA 6, PA 104/56 mean 74, mean PCWP 12, CI 1.41, PVR 23 - Echo (11/21): EF 50-55%, mild LVH, D-shaped septum, severe RV enlargement, moderately decreased RV systolic function, moderate-severe TR, PASP 109, severe RAE.  - V/Q scan (11/21) negative - CTA chest (11/21): No PE or ILD.  - PFTs (11/21): normal - Rheumatologic serologic workup negative, LFTs normal, HIV negative.  - Echo (2/22): EF 60-65%, moderate RV dilation with moderately decreased systolic function (RV EF 36%), D-shaped septum, PASP 53 mmHg (down from 109 mmHg on last echo), normal IVC.  - Negative sleep study.  - Echo (2/23): EF 55-60%, mild RV enlargement with normal RV systolic function, PASP 31.5 mmHg, normal IVC. - Echo (4/24): EF 55-60% with mild RV enlargement but normal RV systolic function, PASP 39 mmHg.  6. HCV antibody positive: RNA not detected.  Abdominal US showed normal liver.  7. COVID-19 infection 2020 8. Multinodular goiter  SH: Married, current smoker, prior IVDU. Occasional ETOH.   Family History  Problem Relation Age of Onset   Hypertension Paternal Grandfather    Hypertension Mother    ROS: All systems reviewed and negative except as per HPI.   Current Outpatient Medications  Medication Sig  Dispense Refill   ALPRAZolam (XANAX) 1 MG tablet Take 1 mg by mouth 3 (three) times daily.     ambrisentan (LETAIRIS) 10 MG tablet Take 1 tablet (10 mg total) by mouth daily. 30 tablet 11   amLODipine (NORVASC) 5 MG tablet Take 1 tablet (5 mg total) by mouth daily. 90 tablet 3   buPROPion (WELLBUTRIN SR) 150 MG 12 hr tablet Take 1 tablet (150 mg total) by mouth 2 (two) times daily. 180 tablet 3   cetirizine (ZYRTEC) 10 MG tablet Take 10 mg by mouth daily.     cyclobenzaprine (FLEXERIL) 10 MG tablet Take 10 mg by mouth 3 (three) times daily.     furosemide (LASIX) 40 MG tablet Take 0.5 tablets (20 mg total) by mouth daily. 180 tablet 1   ibuprofen (ADVIL) 800 MG tablet Take 400 mg by mouth every 6 (six) hours as needed for moderate pain.      lansoprazole (PREVACID) 15 MG capsule Take 15 mg by mouth daily.     levonorgestrel (MIRENA) 20 MCG/24HR IUD 1 each by Intrauterine route once.     levothyroxine (SYNTHROID) 150 MCG tablet Take 1 tablet (150 mcg total) by mouth daily. 90 tablet 3   montelukast (SINGULAIR) 10 MG tablet Take   10 mg by mouth daily.     Selexipag (UPTRAVI) 1400 MCG TABS Take 1,400 mcg by mouth in the morning and at bedtime. 60 tablet 11   tadalafil, PAH, (ADCIRCA) 20 MG tablet Take 2 tablets (40 mg total) by mouth daily. 60 tablet 11   No current facility-administered medications for this encounter.    BP 100/70   Pulse 78   Wt 80.7 kg (178 lb)   SpO2 97%   BMI 29.62 kg/m    Wt Readings from Last 3 Encounters:  11/23/22 80.7 kg (178 lb)  11/08/22 81.6 kg (180 lb)  08/01/22 81.4 kg (179 lb 6.4 oz)  General: NAD Neck: No JVD, no thyromegaly or thyroid nodule.  Lungs: Clear to auscultation bilaterally with normal respiratory effort. CV: Nondisplaced PMI.  Heart regular S1/S2, no S3/S4, no murmur.  No peripheral edema.  No carotid bruit.  Normal pedal pulses.  Abdomen: Soft, nontender, no hepatosplenomegaly, no distention.  Skin: Intact without lesions or rashes.   Neurologic: Alert and oriented x 3.  Psych: Normal affect. Extremities: No clubbing or cyanosis.  HEENT: Normal.    Assessment/Plan:  1. Pulmonary hypertension/RV failure: Echo 11/21 with normal LV size with mild LV hypertrophy, EF 50-55%,  D-shaped septum due to RV pressure/volume overload, severely dilated RV with moderately decreased RV systolic function, PASP 109 mmHg, dilated IVC. She had been symptomatic for months prior to this, suspect due to gradually worsening RV failure.  CTA chest did not show significant lung pathology and did not show PE and V/Q scan negative. PFTs normal. No diet/weight loss drugs.  HIV negative and rheumatologic serologies negative.  HCV antibody positive but liver normal on US and LFTs normal.  RHC in 11/21 showed severe pulmonary arterial hypertension with low cardiac output and normal filling pressures. Echo in 2/22 showed moderate RV dilation with moderate RV dysfunction, but PASP down to 53 mmHg (109 mmHg on prior echo).  Echo in 2/23 with further improvement, EF 55-60%, mild RV enlargement with normal RV systolic function, PASP 31.5 mmHg, normal IVC.  Echo today showed EF 55-60% with mild RV enlargement but normal RV systolic function, PASP 39 mmHg. Suspect primary pulmonary hypertension. Sleep study negative. She is doing well, NYHA class I.  She is not volume overloaded.  - Continue Lasix 20 mg daily, does not want to stop it due to occasional "swelling."  BMET/BNP today.      - Increase tadalafil back to 40 mg daily, has been taking 20 mg for a couple of weeks. - Continue ambrisentan 10 mg daily today.  She has IUD and gets monthly pregnancy tests.  - Selexipag 1400 mcg bid.  Unable to take higher dose due to headaches and diarrhea.   - 6 minute walk today was not accurate due to interruptions, repeat next appt.  - I am going to arrange for repeat RHC to reassess PA pressure and PVR.  We discussed risks/benefits and she agrees to procedure.  If PA pressure  remains elevated, I would like to get her on sotatercept.  2. Systemic HTN: Patient has had systemic HTN for some time.  She has a strong family history of this. She thinks losartan and diltiazem caused facial swelling so has stopped them.  She thinks metoprolol caused a rash.  Concern for secondary HTN given young age (and family history).  Renal artery dopplers in 11/21 without evidence for hemodynamically significant stenosis.  Plasma renin and aldosterone ratio normal.  Serum normetanephrine elevated at 213 pg/mL   with normal metanephrine.  This can be a false positive.  Since 24 hour urine catecholamines were negative, suspect not pheochromocytoma. BP now controlled.    - Continue amlodipine to 5 mg daily. 3. Hypothyroidism: Continue Levoxyl, followed by endocrinology.  Has multinodular goiter.  4. 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 Says she has quit.  5. Hep C: HCV ab reactive. H/o IVDU and has had prior exposure to HCV. Abdominal US without cirrhosis and LFTs normal.  HCV RNA was negative => suspect infection was cleared.  6. Fatigue/sleepiness: Negative sleep study. 7. Active smoker: I strongly encouraged her to quit.  - Has Chantix but has not tried it yet.   Followup in 3 months  Harmonee Tozer 11/24/2022 

## 2022-12-08 ENCOUNTER — Other Ambulatory Visit: Payer: Medicaid Other

## 2022-12-08 ENCOUNTER — Other Ambulatory Visit: Payer: Self-pay

## 2022-12-08 ENCOUNTER — Ambulatory Visit (HOSPITAL_COMMUNITY)
Admission: RE | Admit: 2022-12-08 | Discharge: 2022-12-08 | Disposition: A | Discharge status: Home / Self Care | Payer: Medicaid Other | Attending: Cardiology | Admitting: Cardiology

## 2022-12-08 ENCOUNTER — Encounter (HOSPITAL_COMMUNITY): Payer: Self-pay | Admitting: Cardiology

## 2022-12-08 ENCOUNTER — Encounter (HOSPITAL_COMMUNITY)
Admission: RE | Disposition: A | Discharge status: Home / Self Care | Payer: Self-pay | Source: Home / Self Care | Attending: Cardiology

## 2022-12-08 DIAGNOSIS — Z8619 Personal history of other infectious and parasitic diseases: Secondary | ICD-10-CM | POA: Diagnosis not present

## 2022-12-08 DIAGNOSIS — Z79899 Other long term (current) drug therapy: Secondary | ICD-10-CM | POA: Diagnosis not present

## 2022-12-08 DIAGNOSIS — Z8249 Family history of ischemic heart disease and other diseases of the circulatory system: Secondary | ICD-10-CM | POA: Diagnosis not present

## 2022-12-08 DIAGNOSIS — I509 Heart failure, unspecified: Secondary | ICD-10-CM | POA: Insufficient documentation

## 2022-12-08 DIAGNOSIS — F17200 Nicotine dependence, unspecified, uncomplicated: Secondary | ICD-10-CM | POA: Diagnosis not present

## 2022-12-08 DIAGNOSIS — E039 Hypothyroidism, unspecified: Secondary | ICD-10-CM | POA: Diagnosis not present

## 2022-12-08 DIAGNOSIS — Z79890 Hormone replacement therapy (postmenopausal): Secondary | ICD-10-CM | POA: Insufficient documentation

## 2022-12-08 DIAGNOSIS — I110 Hypertensive heart disease with heart failure: Secondary | ICD-10-CM | POA: Diagnosis not present

## 2022-12-08 DIAGNOSIS — I272 Pulmonary hypertension, unspecified: Secondary | ICD-10-CM | POA: Diagnosis not present

## 2022-12-08 HISTORY — PX: RIGHT HEART CATH: CATH118263

## 2022-12-08 LAB — POCT I-STAT EG7
Acid-base deficit: 1 mmol/L (ref 0.0–2.0)
Acid-base deficit: 2 mmol/L (ref 0.0–2.0)
Acid-base deficit: 2 mmol/L (ref 0.0–2.0)
Bicarbonate: 23.3 mmol/L (ref 20.0–28.0)
Bicarbonate: 23.6 mmol/L (ref 20.0–28.0)
Bicarbonate: 24.4 mmol/L (ref 20.0–28.0)
Calcium, Ion: 1.21 mmol/L (ref 1.15–1.40)
Calcium, Ion: 1.23 mmol/L (ref 1.15–1.40)
Calcium, Ion: 1.23 mmol/L (ref 1.15–1.40)
HCT: 38 % (ref 36.0–46.0)
HCT: 38 % (ref 36.0–46.0)
HCT: 39 % (ref 36.0–46.0)
Hemoglobin: 12.9 g/dL (ref 12.0–15.0)
Hemoglobin: 12.9 g/dL (ref 12.0–15.0)
Hemoglobin: 13.3 g/dL (ref 12.0–15.0)
O2 Saturation: 59 %
O2 Saturation: 62 %
O2 Saturation: 70 %
Potassium: 3.9 mmol/L (ref 3.5–5.1)
Potassium: 3.9 mmol/L (ref 3.5–5.1)
Potassium: 4 mmol/L (ref 3.5–5.1)
Sodium: 140 mmol/L (ref 135–145)
Sodium: 140 mmol/L (ref 135–145)
Sodium: 141 mmol/L (ref 135–145)
TCO2: 24 mmol/L (ref 22–32)
TCO2: 25 mmol/L (ref 22–32)
TCO2: 26 mmol/L (ref 22–32)
pCO2, Ven: 39.2 mmHg — ABNORMAL LOW (ref 44–60)
pCO2, Ven: 41.4 mmHg — ABNORMAL LOW (ref 44–60)
pCO2, Ven: 42.3 mmHg — ABNORMAL LOW (ref 44–60)
pH, Ven: 7.364 (ref 7.25–7.43)
pH, Ven: 7.369 (ref 7.25–7.43)
pH, Ven: 7.381 (ref 7.25–7.43)
pO2, Ven: 32 mmHg (ref 32–45)
pO2, Ven: 33 mmHg (ref 32–45)
pO2, Ven: 37 mmHg (ref 32–45)

## 2022-12-08 LAB — CBC
HCT: 42.6 % (ref 36.0–46.0)
Hemoglobin: 14.5 g/dL (ref 12.0–15.0)
MCH: 29.8 pg (ref 26.0–34.0)
MCHC: 34 g/dL (ref 30.0–36.0)
MCV: 87.5 fL (ref 80.0–100.0)
Platelets: 158 10*3/uL (ref 150–400)
RBC: 4.87 MIL/uL (ref 3.87–5.11)
RDW: 13.4 % (ref 11.5–15.5)
WBC: 3.6 10*3/uL — ABNORMAL LOW (ref 4.0–10.5)
nRBC: 0 % (ref 0.0–0.2)

## 2022-12-08 LAB — PREGNANCY, URINE: Preg Test, Ur: NEGATIVE

## 2022-12-08 LAB — BASIC METABOLIC PANEL
Anion gap: 5 (ref 5–15)
BUN: 16 mg/dL (ref 6–20)
CO2: 25 mmol/L (ref 22–32)
Calcium: 8.7 mg/dL — ABNORMAL LOW (ref 8.9–10.3)
Chloride: 106 mmol/L (ref 98–111)
Creatinine, Ser: 1.19 mg/dL — ABNORMAL HIGH (ref 0.44–1.00)
GFR, Estimated: >60 mL/min (ref >60)
Glucose, Bld: 94 mg/dL (ref 70–99)
Potassium: 4.4 mmol/L (ref 3.5–5.1)
Sodium: 136 mmol/L (ref 135–145)

## 2022-12-08 SURGERY — RIGHT HEART CATH
Anesthesia: LOCAL

## 2022-12-08 MED ORDER — SODIUM CHLORIDE 0.9% FLUSH: 3.0000 mL | INTRAVENOUS | Status: DC | PRN

## 2022-12-08 MED ORDER — ONDANSETRON HCL 4 MG/2ML IJ SOLN: 4.0000 mg | Freq: Four times a day (QID) | INTRAMUSCULAR | Status: DC | PRN

## 2022-12-08 MED ORDER — SODIUM CHLORIDE 0.9 % IV SOLN: 250.0000 mL | INTRAVENOUS | Status: DC | PRN

## 2022-12-08 MED ORDER — SODIUM CHLORIDE 0.9% FLUSH: 3.0000 mL | Freq: Two times a day (BID) | INTRAVENOUS | Status: DC

## 2022-12-08 MED ORDER — HEPARIN (PORCINE) IN NACL 1000-0.9 UT/500ML-% IV SOLN
INTRAVENOUS | Status: DC | PRN
  Administered 2022-12-08: 500 mL

## 2022-12-08 MED ORDER — LIDOCAINE HCL (PF) 1 % IJ SOLN
INTRAMUSCULAR | Status: AC
  Filled 2022-12-08: qty 30

## 2022-12-08 MED ORDER — LABETALOL HCL 5 MG/ML IV SOLN: 10.0000 mg | INTRAVENOUS | Status: DC | PRN

## 2022-12-08 MED ORDER — ACETAMINOPHEN 325 MG PO TABS: 650.0000 mg | ORAL_TABLET | ORAL | Status: DC | PRN

## 2022-12-08 MED ORDER — LIDOCAINE HCL (PF) 1 % IJ SOLN
INTRAMUSCULAR | Status: DC | PRN
  Administered 2022-12-08: 5 mL via INTRADERMAL

## 2022-12-08 MED ORDER — SODIUM CHLORIDE 0.9 % IV SOLN: INTRAVENOUS | Status: DC

## 2022-12-08 MED ORDER — HYDRALAZINE HCL 20 MG/ML IJ SOLN: 10.0000 mg | INTRAMUSCULAR | Status: DC | PRN

## 2022-12-08 SURGICAL SUPPLY — 10 items
CATH BALLN WEDGE 5F 110CM (CATHETERS) IMPLANT
KIT HEART LEFT (KITS) ×1 IMPLANT
PACK CARDIAC CATHETERIZATION (CUSTOM PROCEDURE TRAY) ×1 IMPLANT
PROTECTION STATION PRESSURIZED (MISCELLANEOUS) ×1
SHEATH GLIDE SLENDER 4/5FR (SHEATH) IMPLANT
SHEATH PROBE COVER 6X72 (BAG) IMPLANT
STATION PROTECTION PRESSURIZED (MISCELLANEOUS) IMPLANT
TRANSDUCER W/STOPCOCK (MISCELLANEOUS) ×1 IMPLANT
WIRE EMERALD 3MM-J .025X260CM (WIRE) IMPLANT
WIRE MICROINTRODUCER 60CM (WIRE) IMPLANT

## 2022-12-08 NOTE — Interval H&P Note (Signed)
History and Physical Interval Note:  12/08/2022 1:57 PM  Zoe Roach  has presented today for surgery, with the diagnosis of CHF.  The various methods of treatment have been discussed with the patient and family. After consideration of risks, benefits and other options for treatment, the patient has consented to  Procedure(s): RIGHT HEART CATH (N/A) as a surgical intervention.  The patient's history has been reviewed, patient examined, no change in status, stable for surgery.  I have reviewed the patient's chart and labs.  Questions were answered to the patient's satisfaction.     Ariday Brinker [employer]

## 2022-12-08 NOTE — Progress Notes (Signed)
Patient was given discharge instructions. She verbalized understanding. 

## 2022-12-08 NOTE — Discharge Instructions (Signed)

## 2022-12-11 ENCOUNTER — Other Ambulatory Visit (HOSPITAL_COMMUNITY): Payer: Self-pay

## 2022-12-11 ENCOUNTER — Telehealth (HOSPITAL_COMMUNITY): Payer: Self-pay | Admitting: Pharmacist

## 2022-12-11 NOTE — Telephone Encounter (Signed)
Sent in enrollment application to [employer] for [employer].   Attempted to submit PA before sending, CMM rejection specified that a PA is not required for this medication at this time. Anticipate PA will be required in the future once added to formulary.   Application pending, will continue to follow.   Karle Plumber, PharmD, BCPS, BCCP, CPP Heart Failure Clinic Pharmacist [phone]

## 2022-12-12 ENCOUNTER — Ambulatory Visit
Admission: RE | Admit: 2022-12-12 | Discharge: 2022-12-12 | Disposition: A | Discharge status: Home / Self Care | Payer: Medicaid Other | Source: Ambulatory Visit | Attending: Internal Medicine | Admitting: Internal Medicine

## 2022-12-12 DIAGNOSIS — E042 Nontoxic multinodular goiter: Secondary | ICD-10-CM

## 2022-12-13 ENCOUNTER — Telehealth: Payer: Self-pay | Admitting: Internal Medicine

## 2022-12-13 NOTE — Telephone Encounter (Signed)
Patient advised and states that she has appointment on Monday

## 2022-12-13 NOTE — Telephone Encounter (Signed)
Please contact the patient and let her know that 2 more nodules have met criteria for biopsy, she is already supposed to be seen by surgery to have the thyroid taken out, I would suggest that she sees the surgeon as I am not sure she will need a biopsy if she is already already can have her thyroid taken out   I am very concerned about thyroid cancer, I have provided her Dr. Ulyses Jarred office information to contact them, based on the chart notes they have contacted her to schedule an appointment and she did not call them, on the last visit I told her to contact them to schedule an appointment

## 2022-12-26 NOTE — Telephone Encounter (Signed)
Advanced Heart Failure Patient Advocate Encounter  [facility] saw patient to administer first dose on 12/24/22. First injection given SQ into fatty tissue by patient.  Archer Asa, CPhT

## 2022-12-27 ENCOUNTER — Telehealth: Payer: Self-pay | Admitting: *Deleted

## 2022-12-27 NOTE — Telephone Encounter (Signed)
   Pre-operative Risk Assessment    Patient Name: Zoe Roach  DOB: [birth year] MRN: [id number]      Request for Surgical Clearance    Procedure:   Total Thyroidectomy with nerve monitoring  Date of Surgery:  Clearance TBD                                 Surgeon:  Dr. Linus Salmons Surgeon's Group or Practice Name:  [facility] Phone number:  [phone] Fax number:  [phone]   Type of Clearance Requested:   - Medical    Type of Anesthesia:  Not Indicated   Additional requests/questions:    Signed, Emmit Pomfret   12/27/2022, 1:09 PM

## 2022-12-28 NOTE — Telephone Encounter (Signed)
Patient recently seen by you on 11/23/22 and repeat RHC 12/08/22. Can you please address  cardiac risk assessment for total thyroidectomy and send your response to p cv div preop?  Thank you, Marcelino Duster

## 2023-01-02 ENCOUNTER — Telehealth (HOSPITAL_COMMUNITY): Payer: Self-pay

## 2023-01-02 NOTE — Telephone Encounter (Signed)
Faxed medical clearance to [facility] [phone]. Confirmation received

## 2023-01-09 ENCOUNTER — Other Ambulatory Visit (HOSPITAL_COMMUNITY): Payer: Self-pay

## 2023-01-09 DIAGNOSIS — I272 Pulmonary hypertension, unspecified: Secondary | ICD-10-CM

## 2023-01-09 NOTE — Telephone Encounter (Signed)
   Patient Name: Zoe Roach  DOB: Oct 30, 1990 MRN: [id number]  Primary Cardiologist: Armanda Magic, MD  Chart reviewed as part of pre-operative protocol coverage. Given past medical history and time since last visit, based on ACC/AHA guidelines, JODEL SCIANDRA is at acceptable risk for the planned procedure without further cardiovascular testing.   The patient was advised that if she develops new symptoms prior to surgery to contact our office to arrange for a follow-up visit, and she verbalized understanding.  I will route this recommendation to the requesting party via Epic fax function and remove from pre-op pool.  Please call with questions.  Napoleon Form, Leodis Rains, NP 01/09/2023, 12:30 PM

## 2023-01-11 ENCOUNTER — Ambulatory Visit (HOSPITAL_COMMUNITY)
Admission: RE | Admit: 2023-01-11 | Discharge: 2023-01-11 | Disposition: A | Discharge status: Home / Self Care | Payer: Medicaid Other | Source: Ambulatory Visit | Attending: Internal Medicine | Admitting: Internal Medicine

## 2023-01-11 ENCOUNTER — Other Ambulatory Visit (HOSPITAL_COMMUNITY): Payer: Self-pay | Admitting: Pharmacist

## 2023-01-11 DIAGNOSIS — I272 Pulmonary hypertension, unspecified: Secondary | ICD-10-CM | POA: Insufficient documentation

## 2023-01-11 LAB — CBC
HCT: 46.2 % — ABNORMAL HIGH (ref 36.0–46.0)
Hemoglobin: 15.7 g/dL — ABNORMAL HIGH (ref 12.0–15.0)
MCH: 29.8 pg (ref 26.0–34.0)
MCHC: 34 g/dL (ref 30.0–36.0)
MCV: 87.7 fL (ref 80.0–100.0)
Platelets: 129 10*3/uL — ABNORMAL LOW (ref 150–400)
RBC: 5.27 MIL/uL — ABNORMAL HIGH (ref 3.87–5.11)
RDW: 13.3 % (ref 11.5–15.5)
WBC: 4.6 10*3/uL (ref 4.0–10.5)
nRBC: 0 % (ref 0.0–0.2)

## 2023-01-11 MED ORDER — WINREVAIR 60 MG ~~LOC~~ KIT: PACK | SUBCUTANEOUS | Status: DC

## 2023-01-16 ENCOUNTER — Telehealth (HOSPITAL_COMMUNITY): Payer: Self-pay | Admitting: Pharmacist

## 2023-01-16 DIAGNOSIS — I272 Pulmonary hypertension, unspecified: Secondary | ICD-10-CM

## 2023-01-16 NOTE — Telephone Encounter (Signed)
Spoke with patient and have lab appointment scheduled

## 2023-01-16 NOTE — Addendum Note (Signed)
Addended by: Linda Hedges on: 01/16/2023 11:11 AM   Modules accepted: Orders

## 2023-01-16 NOTE — Telephone Encounter (Signed)
Received message from specialty pharmacy that patient received her second injection on Winrevair 01/15/23 and did well. She will need CBC's drawn before her first 5 doses to monitor HgB and PLT.   Karle Plumber, PharmD, BCPS, BCCP, CPP Heart Failure Clinic Pharmacist [phone]

## 2023-01-28 IMAGING — US US FNA BIOPSY THYROID 1ST LESION
1 series · 13 of 23 positions shown · non-contrast
Comparison: Ultrasound thyroid 12/06/2021

MEDICATIONS:
None

COMPLICATIONS:
None immediate.

INDICATION: Indeterminate thyroid nodules

EXAM:
ULTRASOUND GUIDED FINE NEEDLE ASPIRATION OF INDETERMINATE THYROID
NODULE
TECHNIQUE: Informed written consent was obtained from the patient after a
discussion of the risks, benefits and alternatives to treatment.
Questions regarding the procedure were encouraged and answered. A
timeout was performed prior to the initiation of the procedure.

[Series 1: us fna biopsy thyroid 1st lesion · 0.06mm/px · 23 acquisitions, 13 frames shown]
[im 1/23]
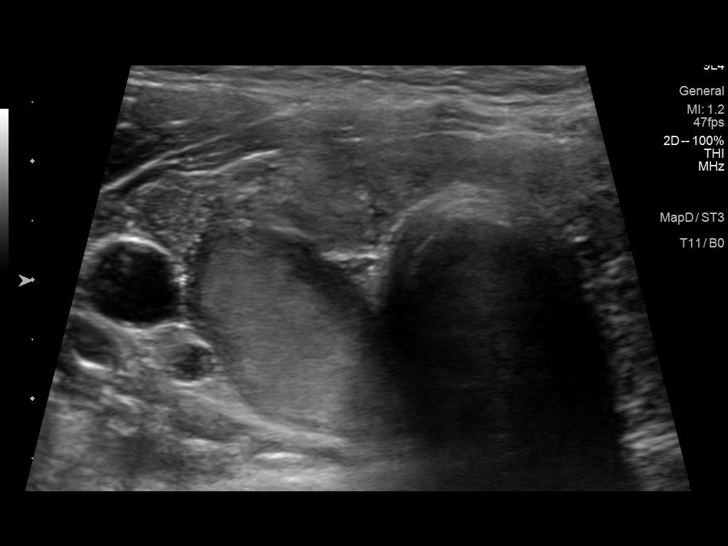
[im 3/23]
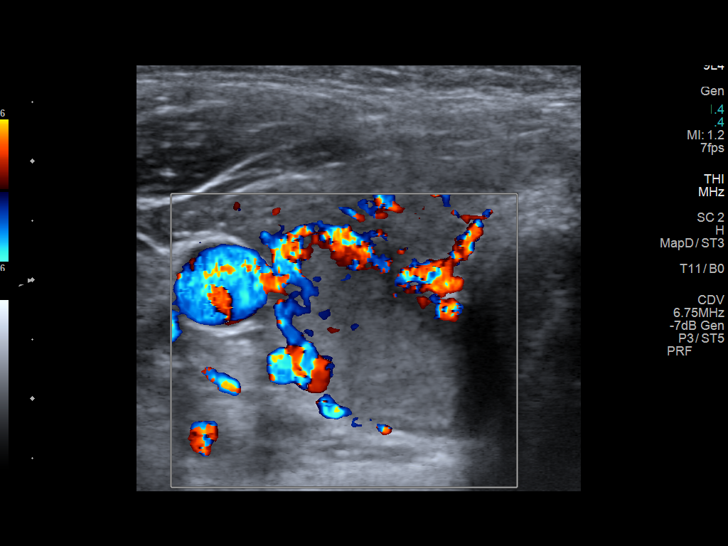
[im 5/23]
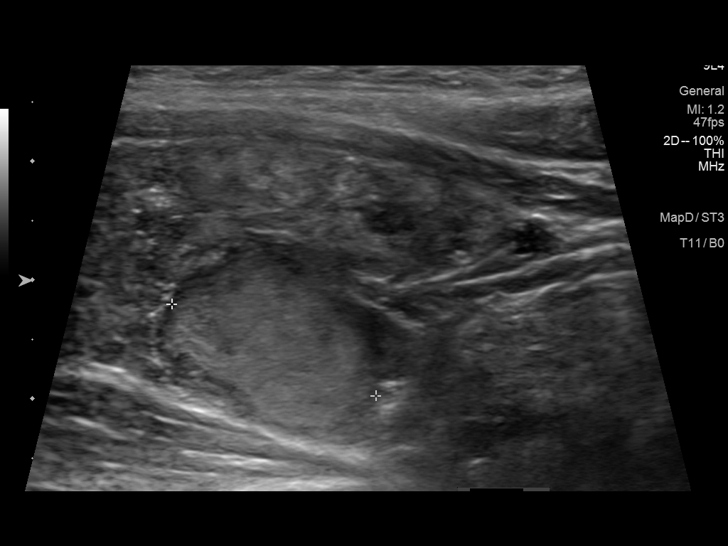
[im 7/23]
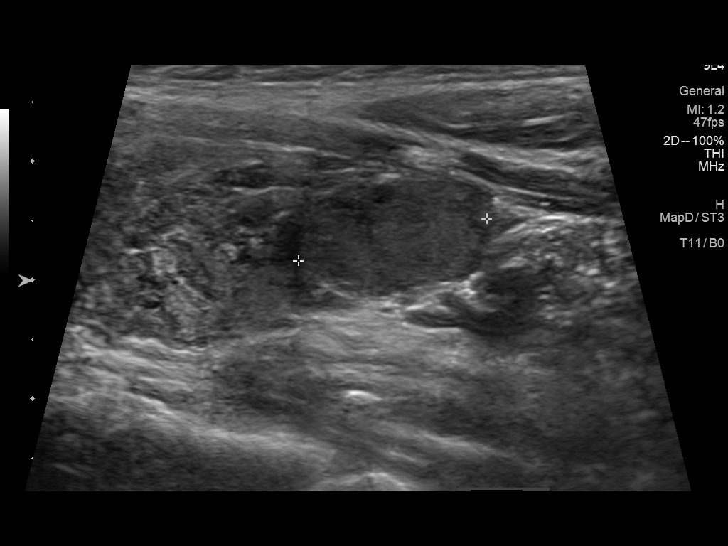
[im 8/23]
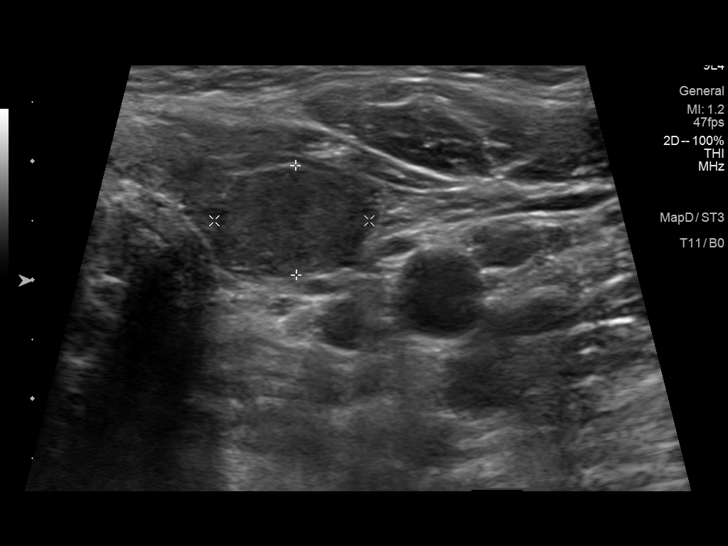
[im 10/23]
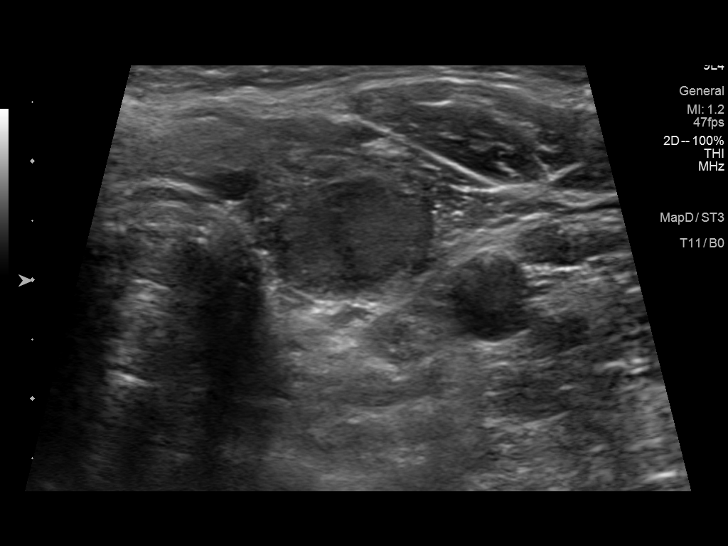
[im 12/23]
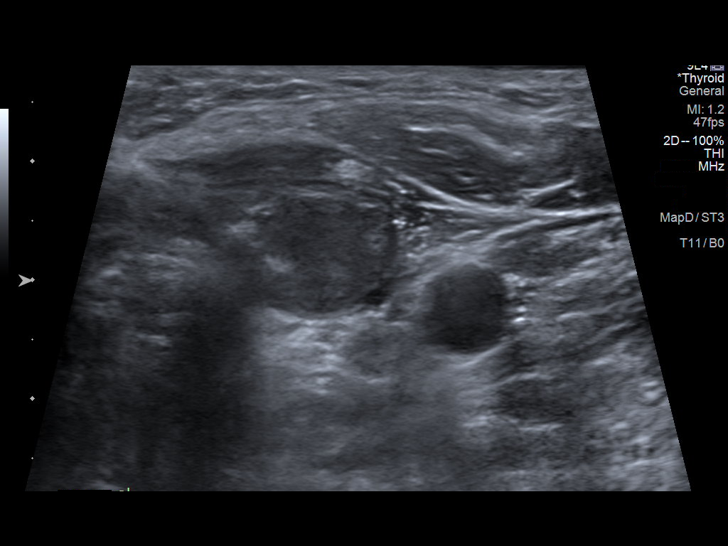
[im 14/23]
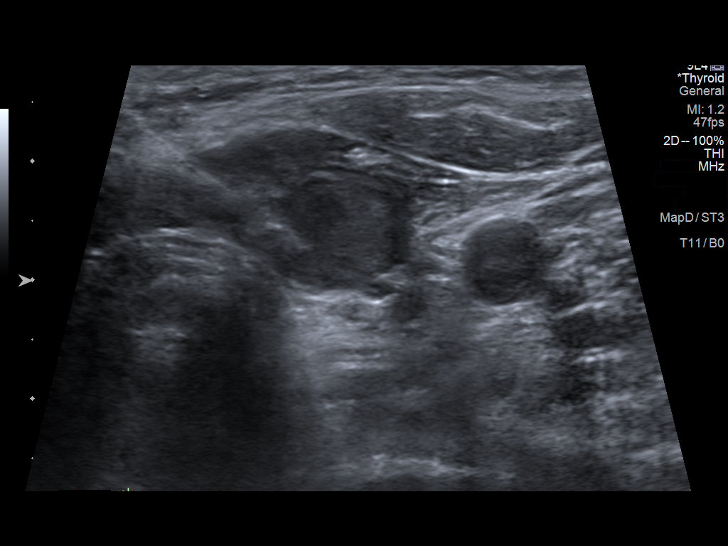
[im 16/23]
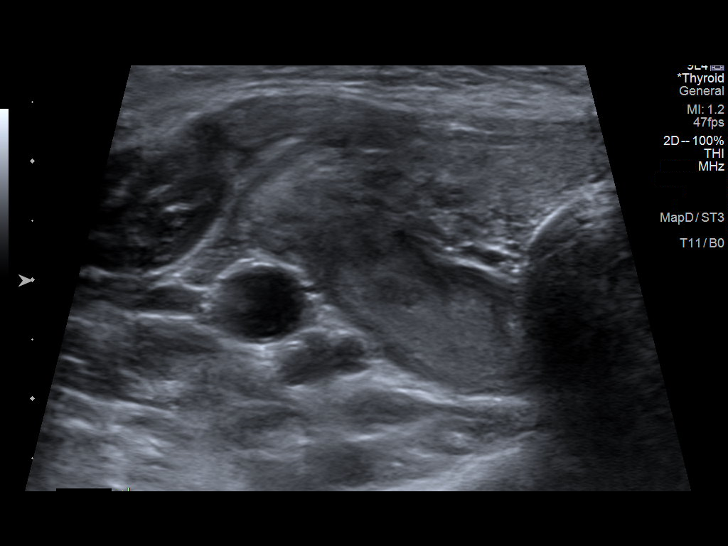
[im 17/23]
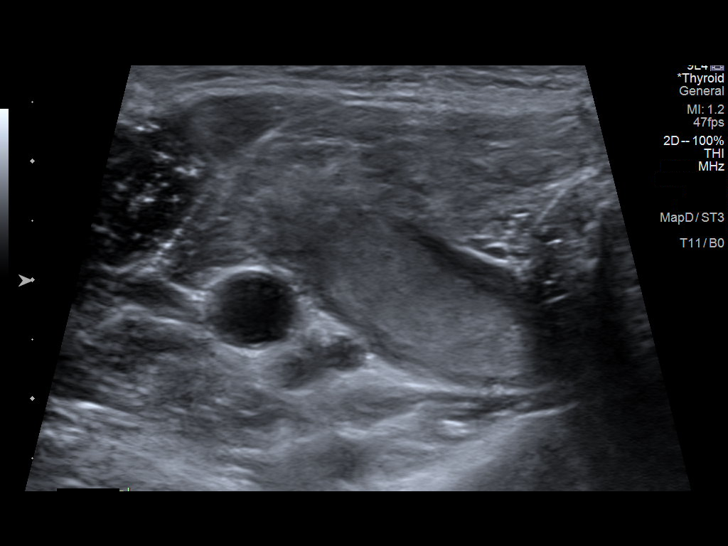
[im 19/23]
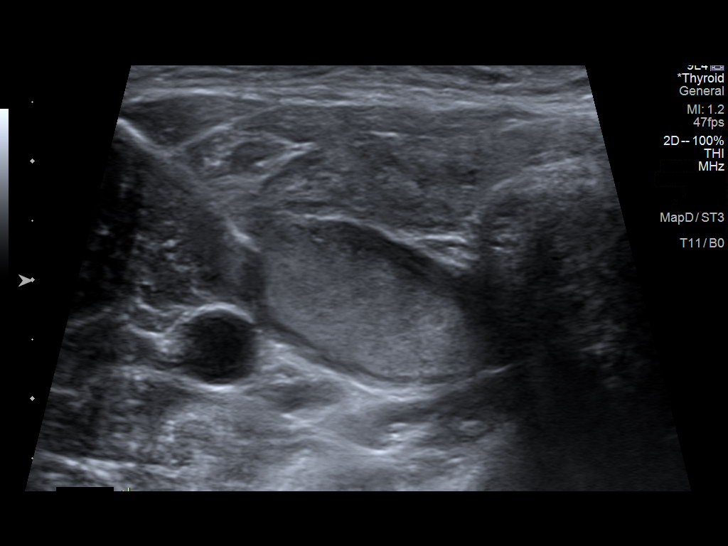
[im 21/23]
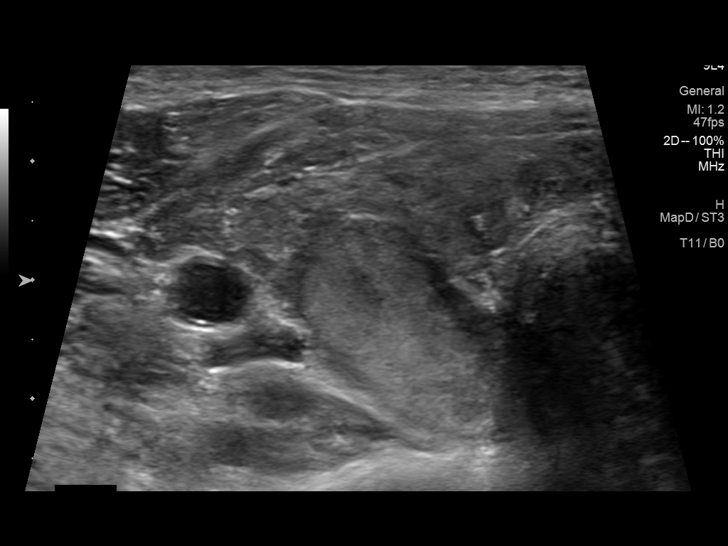
[im 23/23]
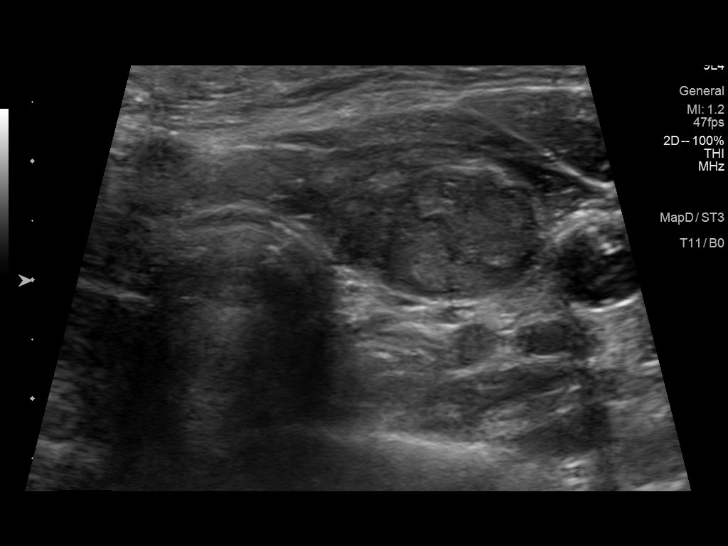

[13 of 23 positions shown; findings below may reference images not displayed]

Pre-procedural ultrasound scanning demonstrated unchanged size and
appearance of the indeterminate nodules within the right and left
lobes of the thyroid

The procedure was planned. The neck was prepped in the usual sterile
fashion, and a sterile drape was applied covering the operative
field. A timeout was performed prior to the initiation of the
procedure. Local anesthesia was provided with 1% lidocaine.

Under direct ultrasound guidance, 5 FNA biopsies were performed of
the right inferior nodule with a 27 gauge needle. Multiple
ultrasound images were saved for procedural documentation purposes.
The samples were prepared and submitted to pathology. Two of the
specimens were reserved for Afirma testing

Under direct ultrasound guidance, 5 FNA biopsies were performed of
the left inferior nodule with a 27 gauge needle. Multiple ultrasound
images were saved for procedural documentation purposes. The samples
were prepared and submitted to pathology. Two of the specimens were
reserved for Afirma testing.

Limited post procedural scanning was negative for hematoma or
additional complication. Dressings were placed. The patient
tolerated the above procedures procedure well without immediate
postprocedural complication.
FINDINGS: Nodule reference number based on prior diagnostic ultrasound: 1

Maximum size: 2.0 cm

Location: Right; inferior

ACR TI-RADS risk category: TR4 (4-6 points)

Reason for biopsy: meets ACR TI-RADS criteria

_________________________________________________________

Nodule reference number based on prior diagnostic ultrasound: 4

Maximum size: 2.0 cm

Location: Left; Inferior

ACR TI-RADS risk category: TR4 (4-6 points)

Reason for biopsy: meets ACR TI-RADS criteria

Ultrasound imaging confirms appropriate placement of the needles
within the thyroid nodule.
IMPRESSION: 1. Technically successful ultrasound guided fine needle aspiration
of right inferior nodule
2. Technically successful ultrasound guided fine needle aspiration
of left inferior nodule

Performed and read by Babin, Lila

## 2023-01-28 IMAGING — US US FNA BIOPSY THYROID 1ST LESION
1 series · 13 of 23 positions shown · non-contrast
Comparison: Ultrasound thyroid 12/06/2021

MEDICATIONS:
None

COMPLICATIONS:
None immediate.

INDICATION: Indeterminate thyroid nodules

EXAM:
ULTRASOUND GUIDED FINE NEEDLE ASPIRATION OF INDETERMINATE THYROID
NODULE
TECHNIQUE: Informed written consent was obtained from the patient after a
discussion of the risks, benefits and alternatives to treatment.
Questions regarding the procedure were encouraged and answered. A
timeout was performed prior to the initiation of the procedure.

[Series 1: us fna biopsy thyroid 1st lesion · 0.06mm/px · 23 acquisitions, 13 frames shown]
[im 1/23]
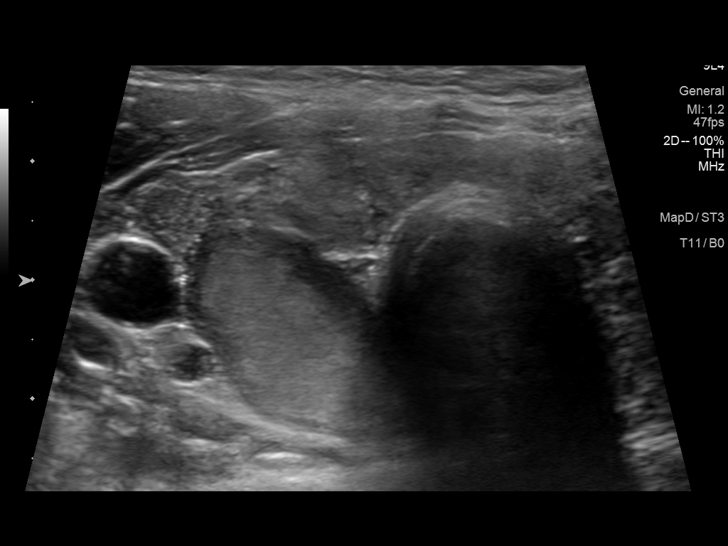
[im 3/23]
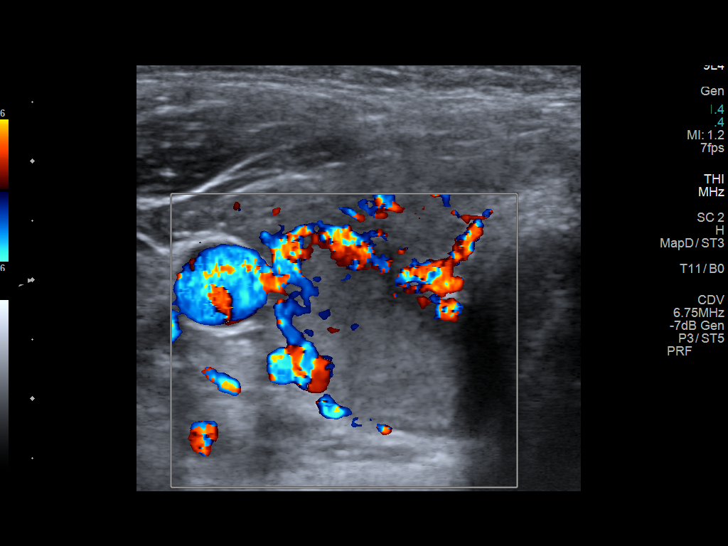
[im 5/23]
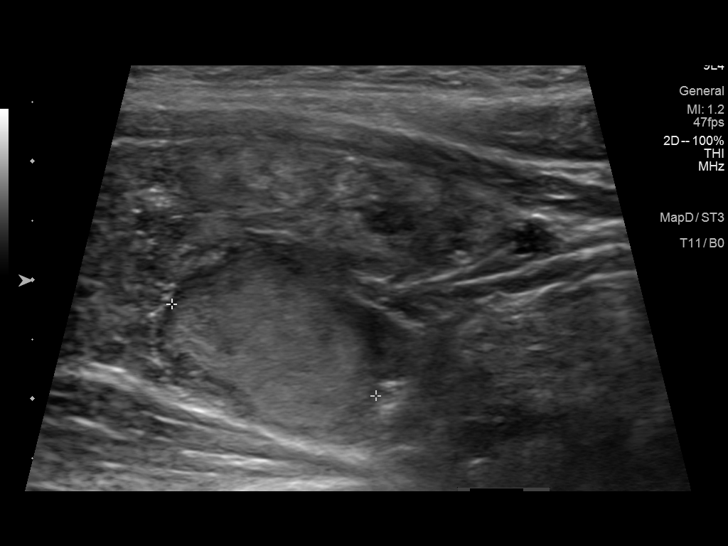
[im 7/23]
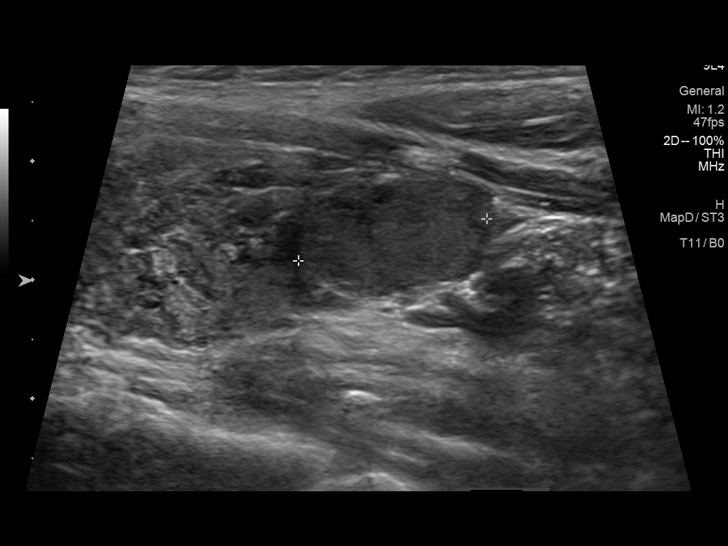
[im 8/23]
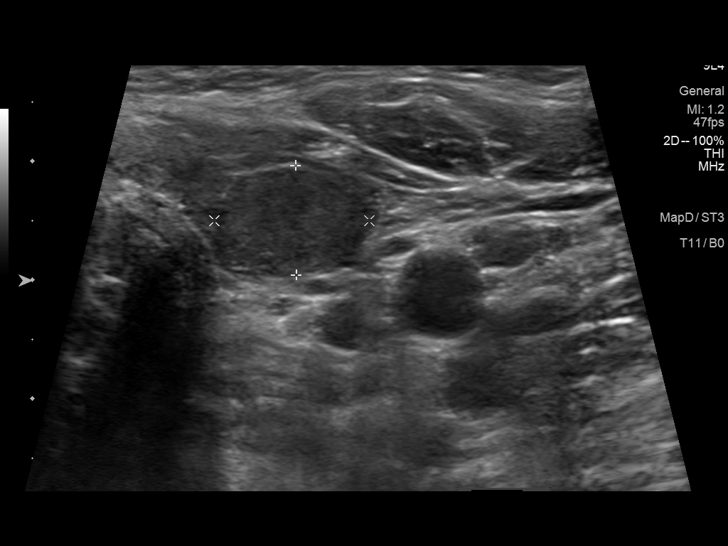
[im 10/23]
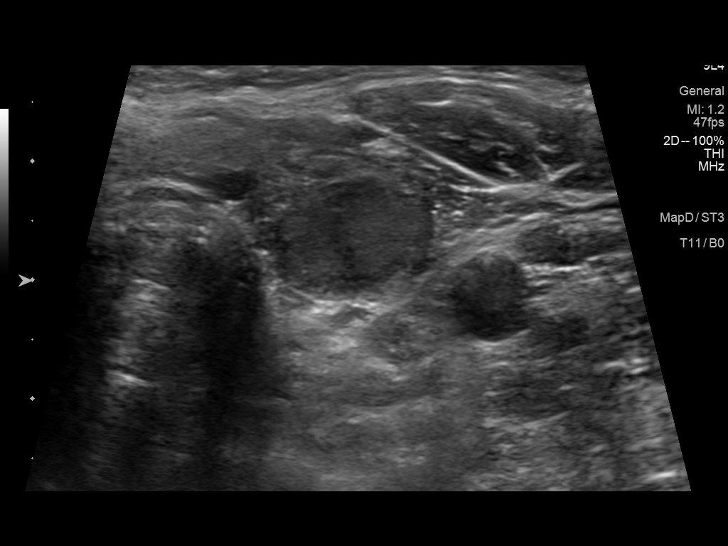
[im 12/23]
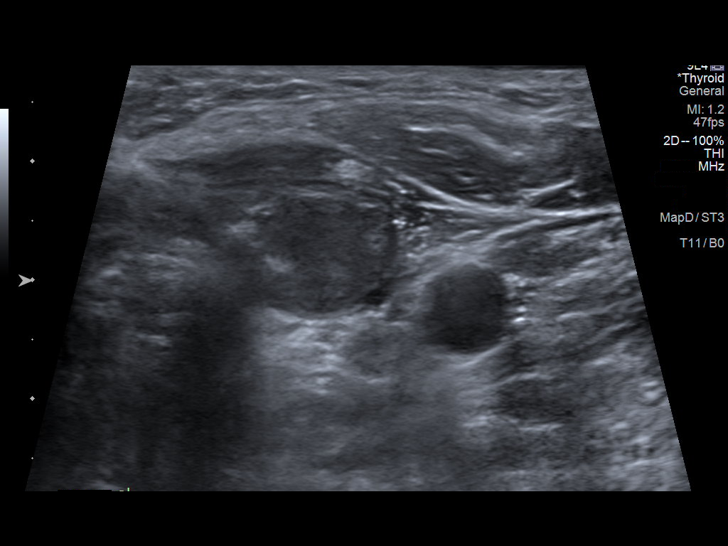
[im 14/23]
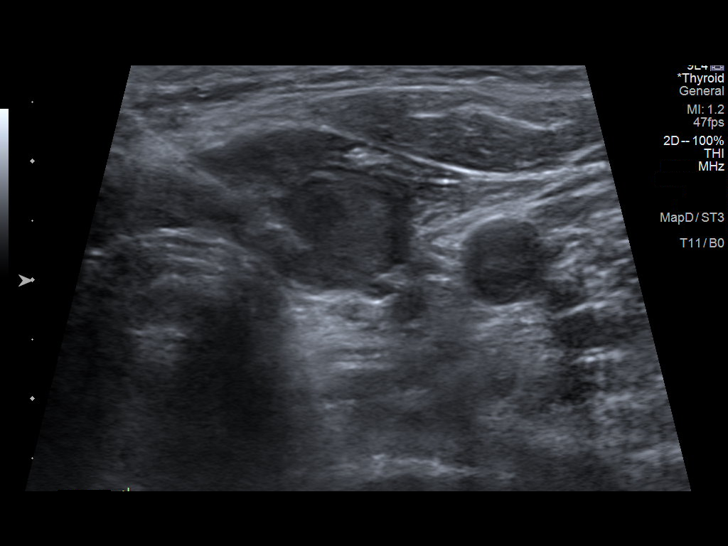
[im 16/23]
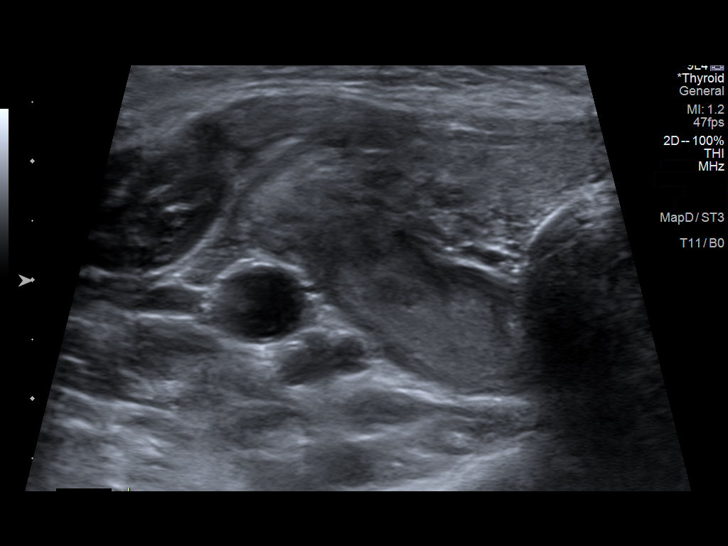
[im 17/23]
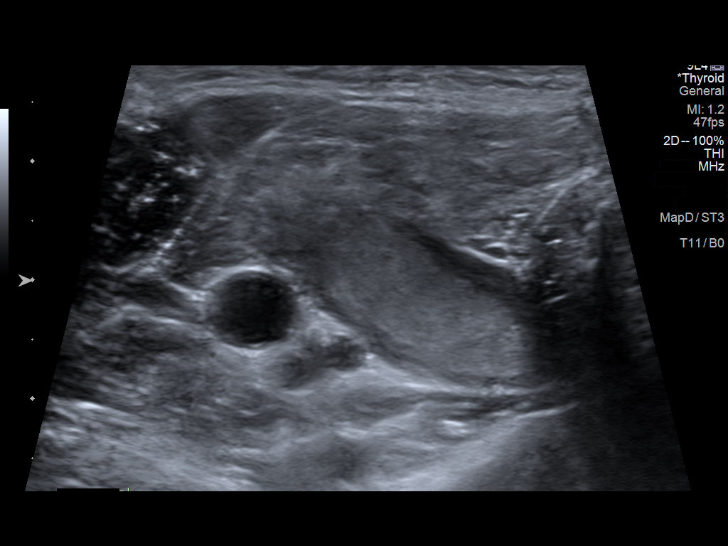
[im 19/23]
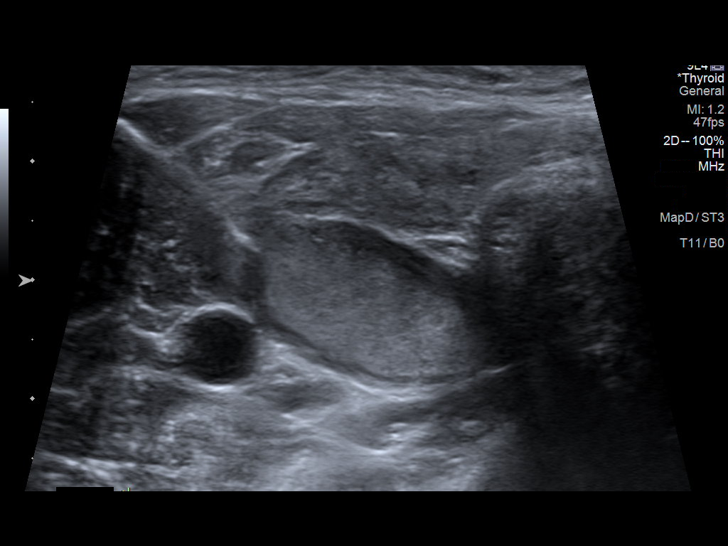
[im 21/23]
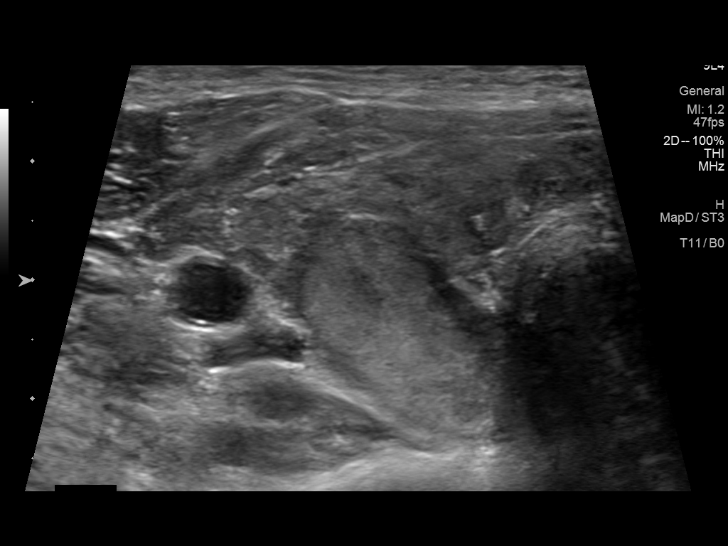
[im 23/23]
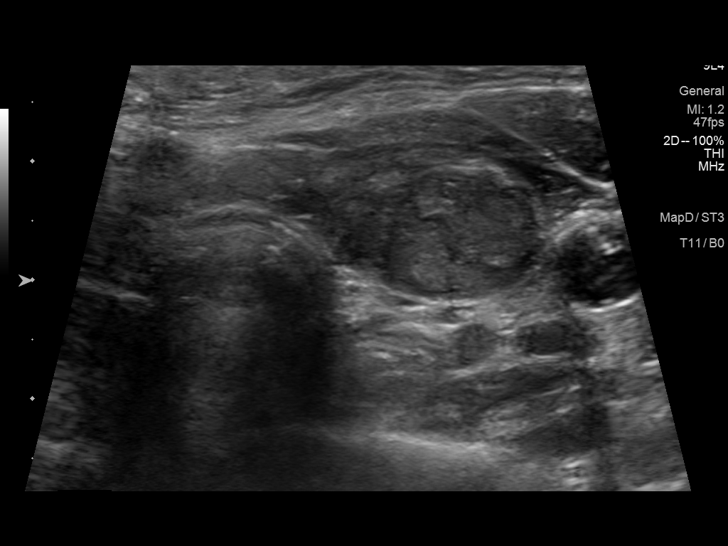

[13 of 23 positions shown; findings below may reference images not displayed]

Pre-procedural ultrasound scanning demonstrated unchanged size and
appearance of the indeterminate nodules within the right and left
lobes of the thyroid

The procedure was planned. The neck was prepped in the usual sterile
fashion, and a sterile drape was applied covering the operative
field. A timeout was performed prior to the initiation of the
procedure. Local anesthesia was provided with 1% lidocaine.

Under direct ultrasound guidance, 5 FNA biopsies were performed of
the right inferior nodule with a 27 gauge needle. Multiple
ultrasound images were saved for procedural documentation purposes.
The samples were prepared and submitted to pathology. Two of the
specimens were reserved for Afirma testing

Under direct ultrasound guidance, 5 FNA biopsies were performed of
the left inferior nodule with a 27 gauge needle. Multiple ultrasound
images were saved for procedural documentation purposes. The samples
were prepared and submitted to pathology. Two of the specimens were
reserved for Afirma testing.

Limited post procedural scanning was negative for hematoma or
additional complication. Dressings were placed. The patient
tolerated the above procedures procedure well without immediate
postprocedural complication.
FINDINGS: Nodule reference number based on prior diagnostic ultrasound: 1

Maximum size: 2.0 cm

Location: Right; inferior

ACR TI-RADS risk category: TR4 (4-6 points)

Reason for biopsy: meets ACR TI-RADS criteria

_________________________________________________________

Nodule reference number based on prior diagnostic ultrasound: 4

Maximum size: 2.0 cm

Location: Left; Inferior

ACR TI-RADS risk category: TR4 (4-6 points)

Reason for biopsy: meets ACR TI-RADS criteria

Ultrasound imaging confirms appropriate placement of the needles
within the thyroid nodule.
IMPRESSION: 1. Technically successful ultrasound guided fine needle aspiration
of right inferior nodule
2. Technically successful ultrasound guided fine needle aspiration
of left inferior nodule

Performed and read by Babin, Lila

## 2023-01-29 ENCOUNTER — Encounter
Admission: RE | Admit: 2023-01-29 | Discharge: 2023-01-29 | Disposition: A | Discharge status: Home / Self Care | Payer: Medicaid Other | Source: Ambulatory Visit | Attending: Unknown Physician Specialty | Admitting: Unknown Physician Specialty

## 2023-01-29 ENCOUNTER — Ambulatory Visit (HOSPITAL_COMMUNITY)
Admission: RE | Admit: 2023-01-29 | Discharge: 2023-01-29 | Disposition: A | Discharge status: Home / Self Care | Payer: Medicaid Other | Source: Ambulatory Visit | Attending: Internal Medicine | Admitting: Internal Medicine

## 2023-01-29 VITALS — Ht 65.0 in | Wt 176.4 lb

## 2023-01-29 DIAGNOSIS — Z79899 Other long term (current) drug therapy: Secondary | ICD-10-CM

## 2023-01-29 DIAGNOSIS — I10 Essential (primary) hypertension: Secondary | ICD-10-CM

## 2023-01-29 DIAGNOSIS — I272 Pulmonary hypertension, unspecified: Secondary | ICD-10-CM | POA: Insufficient documentation

## 2023-01-29 DIAGNOSIS — Z01812 Encounter for preprocedural laboratory examination: Secondary | ICD-10-CM

## 2023-01-29 DIAGNOSIS — Z01818 Encounter for other preprocedural examination: Secondary | ICD-10-CM

## 2023-01-29 HISTORY — DX: Depression, unspecified: F32.A

## 2023-01-29 HISTORY — DX: Other specified abnormal immunological findings in serum: R76.8

## 2023-01-29 HISTORY — DX: Tobacco use: Z72.0

## 2023-01-29 HISTORY — DX: Opioid dependence, in remission: F11.21

## 2023-01-29 HISTORY — DX: Nontoxic multinodular goiter: E04.2

## 2023-01-29 HISTORY — DX: Gastro-esophageal reflux disease without esophagitis: K21.9

## 2023-01-29 HISTORY — DX: Anxiety disorder, unspecified: F41.9

## 2023-01-29 HISTORY — DX: Dyspnea, unspecified: R06.00

## 2023-01-29 LAB — CBC
HCT: 45.5 % (ref 36.0–46.0)
Hemoglobin: 15.1 g/dL — ABNORMAL HIGH (ref 12.0–15.0)
MCH: 28.6 pg (ref 26.0–34.0)
MCHC: 33.2 g/dL (ref 30.0–36.0)
MCV: 86.2 fL (ref 80.0–100.0)
Platelets: 156 10*3/uL (ref 150–400)
RBC: 5.28 MIL/uL — ABNORMAL HIGH (ref 3.87–5.11)
RDW: 13.6 % (ref 11.5–15.5)
WBC: 4 10*3/uL (ref 4.0–10.5)
nRBC: 0 % (ref 0.0–0.2)

## 2023-01-29 NOTE — Patient Instructions (Addendum)
Your procedure is scheduled on:02-06-23 Tuesday Report to the Registration Desk on the 1st floor of the Medical Mall.Then proceed to the 2nd floor Surgery Desk  To find out your arrival time, please call [phone] between 1PM - 3PM on:02-05-23 Monday If your arrival time is 6:00 am, do not arrive before that time as the Medical Mall entrance doors do not open until 6:00 am.  REMEMBER: Instructions that are not followed completely may result in serious medical risk, up to and including death; or upon the discretion of your surgeon and anesthesiologist your surgery may need to be rescheduled.  Do not eat food after midnight the night before surgery.  No gum chewing or hard candies.  You may however, drink CLEAR liquids up to 2 hours before you are scheduled to arrive for your surgery. Do not drink anything within 2 hours of your scheduled arrival time.  Clear liquids include: - water  - apple juice without pulp - gatorade (not RED colors) - black coffee or tea (Do NOT add milk or creamers to the coffee or tea) Do NOT drink anything that is not on this list.  One week prior to surgery:Last dose today (01-29-23) Stop Anti-inflammatories (NSAIDS) such as Advil, Aleve, Ibuprofen, Motrin, Naproxen, Naprosyn and Aspirin based products such as Excedrin, Goody's Powder, BC Powder.You may however, take Tylenol if needed for pain up until the day of surgery. Stop ANY OVER THE COUNTER supplements/vitamins NOW (01-29-23) until after surgery.  TAKE ONLY THESE MEDICATIONS THE MORNING OF SURGERY WITH A SIP OF WATER: -ALPRAZolam (XANAX)  -buPROPion (WELLBUTRIN SR)  -amLODipine (NORVASC)  -amotidine (PEPCID)-take one the night before surgery and one the morning of surgery -levothyroxine (SYNTHROID)  -Selexipag (UPTRAVI)   Continue your tadalafil and furosemide (LASIX) up until the day prior to surgery-Do NOT take the morning of surgery  No Alcohol for 24 hours before or after surgery.  No Smoking  including e-cigarettes for 24 hours before surgery.  No chewable tobacco products for at least 6 hours before surgery.  No nicotine patches on the day of surgery.  Do not use any "recreational" drugs for at least a week (preferably 2 weeks) before your surgery.  Please be advised that the combination of cocaine and anesthesia may have negative outcomes, up to and including death. If you test positive for cocaine, your surgery will be cancelled.  On the morning of surgery brush your teeth with toothpaste and water, you may rinse your mouth with mouthwash if you wish. Do not swallow any toothpaste or mouthwash.  Use CHG Soap as directed on instruction sheet.  Do not wear jewelry, make-up, hairpins, clips or nail polish.  Do not wear lotions, powders, or perfumes.   Do not shave body hair from the neck down 48 hours before surgery.  Contact lenses, hearing aids and dentures may not be worn into surgery.  Do not bring valuables to the hospital. [facility] is not responsible for any missing/lost belongings or valuables.   Notify your doctor if there is any change in your medical condition (cold, fever, infection).  Wear comfortable clothing (specific to your surgery type) to the hospital.  After surgery, you can help prevent lung complications by doing breathing exercises.  Take deep breaths and cough every 1-2 hours. Your doctor may order a device called an Incentive Spirometer to help you take deep breaths. When coughing or sneezing, hold a pillow firmly against your incision with both hands. This is called "splinting." Doing this helps protect  your incision. It also decreases belly discomfort.  If you are being admitted to the hospital overnight, leave your suitcase in the car. After surgery it may be brought to your room.  In case of increased patient census, it may be necessary for you, the patient, to continue your postoperative care in the Same Day Surgery department.  If you  are being discharged the day of surgery, you will not be allowed to drive home. You will need a responsible individual to drive you home and stay with you for 24 hours after surgery.   If you are taking public transportation, you will need to have a responsible individual with you.  Please call the Pre-admissions Testing Dept. at [phone] if you have any questions about these instructions.  Surgery Visitation Policy:  Patients having surgery or a procedure may have two visitors.  Children under the age of 39 must have an adult with them who is not the patient.     Preparing for Surgery with CHLORHEXIDINE GLUCONATE (CHG) Soap  Chlorhexidine Gluconate (CHG) Soap  o An antiseptic cleaner that kills germs and bonds with the skin to continue killing germs even after washing  o Used for showering the night before surgery and morning of surgery  Before surgery, you can play an important role by reducing the number of germs on your skin.  CHG (Chlorhexidine gluconate) soap is an antiseptic cleanser which kills germs and bonds with the skin to continue killing germs even after washing.  Please do not use if you have an allergy to CHG or antibacterial soaps. If your skin becomes reddened/irritated stop using the CHG.  1. Shower the NIGHT BEFORE SURGERY and the MORNING OF SURGERY with CHG soap.  2. If you choose to wash your hair, wash your hair first as usual with your normal shampoo.  3. After shampooing, rinse your hair and body thoroughly to remove the shampoo.  4. Use CHG as you would any other liquid soap. You can apply CHG directly to the skin and wash gently with a scrungie or a clean washcloth.  5. Apply the CHG soap to your body only from the neck down. Do not use on open wounds or open sores. Avoid contact with your eyes, ears, mouth, and genitals (private parts). Wash face and genitals (private parts) with your normal soap.  6. Wash thoroughly, paying special attention to  the area where your surgery will be performed.  7. Thoroughly rinse your body with warm water.  8. Do not shower/wash with your normal soap after using and rinsing off the CHG soap.  9. Pat yourself dry with a clean towel.  10. Wear clean pajamas to bed the night before surgery.  12. Place clean sheets on your bed the night of your first shower and do not sleep with pets.  13. Shower again with the CHG soap on the day of surgery prior to arriving at the hospital.  14. Do not apply any deodorants/lotions/powders.  15. Please wear clean clothes to the hospital.

## 2023-01-30 ENCOUNTER — Encounter
Admission: RE | Admit: 2023-01-30 | Discharge: 2023-01-30 | Disposition: A | Discharge status: Home / Self Care | Payer: Medicaid Other | Source: Ambulatory Visit | Attending: Unknown Physician Specialty | Admitting: Unknown Physician Specialty

## 2023-01-30 DIAGNOSIS — Z79899 Other long term (current) drug therapy: Secondary | ICD-10-CM | POA: Insufficient documentation

## 2023-01-30 DIAGNOSIS — I10 Essential (primary) hypertension: Secondary | ICD-10-CM | POA: Insufficient documentation

## 2023-01-30 DIAGNOSIS — I272 Pulmonary hypertension, unspecified: Secondary | ICD-10-CM | POA: Diagnosis not present

## 2023-01-30 DIAGNOSIS — Z01812 Encounter for preprocedural laboratory examination: Secondary | ICD-10-CM | POA: Insufficient documentation

## 2023-01-30 LAB — BASIC METABOLIC PANEL
Anion gap: 8 (ref 5–15)
BUN: 14 mg/dL (ref 6–20)
CO2: 23 mmol/L (ref 22–32)
Calcium: 8.4 mg/dL — ABNORMAL LOW (ref 8.9–10.3)
Chloride: 106 mmol/L (ref 98–111)
Creatinine, Ser: 1.16 mg/dL — ABNORMAL HIGH (ref 0.44–1.00)
GFR, Estimated: >60 mL/min (ref >60)
Glucose, Bld: 79 mg/dL (ref 70–99)
Potassium: 4 mmol/L (ref 3.5–5.1)
Sodium: 137 mmol/L (ref 135–145)

## 2023-01-31 ENCOUNTER — Encounter: Payer: Self-pay | Admitting: Unknown Physician Specialty

## 2023-01-31 NOTE — Progress Notes (Signed)
Perioperative / Anesthesia Services  Pre-Admission Testing Clinical Review / Preoperative Anesthesia Consult  Date: 02/01/23  Patient Demographics:  Name: Zoe Roach DOB:   07/11/1991 MRN:   [id number]  Planned Surgical Procedure(s):    Case: [id number] Date/Time: 02/06/23 0715   Procedure: TOTAL THYROIDECTOMY (Bilateral)   Anesthesia type: General   Pre-op diagnosis: THYROID NODULE   Location: [facility] OR ROOM 09 / [facility] ORS FOR ANESTHESIA GROUP   Surgeons: Linus Salmons, MD     NOTE: Available PAT nursing documentation and vital signs have been reviewed. Clinical nursing staff has updated patient's PMH/PSHx, current medication list, and drug allergies/intolerances to ensure comprehensive history available to assist in medical decision making as it pertains to the aforementioned surgical procedure and anticipated anesthetic course. Extensive review of available clinical information personally performed. [place] PMH and PSHx updated with any diagnoses/procedures that  may have been inadvertently omitted during her intake with the pre-admission testing department's nursing staff.  Clinical Discussion:  Zoe Roach is a 33 y.o. female who is submitted for pre-surgical anesthesia review and clearance prior to her undergoing the above procedure. Patient is a Current Smoker (7.5 pack years). Pertinent PMH includes: CHF, LPFB, LEFT renal artery stenosis, hypothyroidism, multinodular goiter, pulmonary hypertension, GERD (on daily H2 blocker), SOB, history of narcotic addiction, history of IVDU, anxiety (on BZO).   Patient is followed by cardiology Shirlee Latch, MD). She was last seen in the cardiology clinic on 11/23/2022; notes reviewed. At the time of her clinic visit, patient doing well overall from a cardiovascular perspective. Patient denied any chest pain, shortness of breath, PND, orthopnea, palpitations, significant peripheral edema, weakness, fatigue, vertiginous symptoms, or  presyncope/syncope. Patient with a past medical history significant for cardiovascular diagnoses. Documented physical exam was grossly benign, providing no evidence of acute exacerbation and/or decompensation of the patient's known cardiovascular conditions.  Renal ultrasound performed on 06/19/2020 revealed a 1-59% stenosis of the LEFT renal artery.  Most recent TTE was performed on 11/23/2022 revealing a normal left ventricular systolic function with an EF of 55 to 60%.  There were no regional wall motion abnormalities.  Diastolic Doppler parameters were normal.  Right ventricular size and function normal.  PASP elevated at 38.8 mmHg consistent with patient's known pulmonary hypertension.  There was trivial mitral valve regurgitation. All transvalvular gradients were noted to be normal providing no evidence suggestive of valvular stenosis.  Aorta was normal in size providing no evidence of aneurysmal dilatation.  Patient's most recent RIGHT cardiac catheterization on 12/08/2022 revealing normal filling pressures, mild pulmonary hypertension, and preserved cardiac output.  Hemodynamics: mean RA = 2 mmHg, mean PA = 32 mmHg, mean PCWP = 6 mmHg, SVC saturation = 62%, PA saturation = 70%, AO saturation = 97%.  Cardiac output = 4.66 L/min, cardiac index = 2.5 L/min/m.  PVR = 5.6 Wood units.  Blood pressure well controlled at 100/70 mmHg on currently prescribed CCB (amlodipine) and diuretic (furosemide) therapies.  Patient is not on any type of lipid-lowering therapies.  She is not diabetic.  Patient does not have an OSAH diagnosis.  Pulmonary hypertension being managed with multiple pharmacological interventions including ambrisentan, selexipag, sotatercept, and tadalafil.  Patient active at baseline.  She cleans houses for work.  Patient participates in regular classes, goes to the gym, and participates in yoga.  While patient does get fatigued at times, she does not experience any significant exertional  dyspnea with the aforementioned activities.  With that being said, per the [facility]AS[facility], patient is felt  to be able to achieve >/= 4 METS of physical activity without experiencing any significant degrees of angina/anginal equivalent symptoms.  No changes were made to her medication regimen.  Patient to follow-up with outpatient cardiology in 3 months or sooner if needed.  Zoe Roach is scheduled for a TOTAL THYROIDECTOMY on 02/06/2023 with Dr. Linus Salmons, MD.  Given patient's past medical history significant for cardiovascular diagnoses, presurgical cardiac clearance was sought by the PAT team. Per cardiology, "based ACC/AHA guidelines, the patient's past medical history, and the amount of time since her last clinic visit, this patient would be at an overall MODERATE/ACCEPTABLE risk for the planned procedure without further cardiovascular testing or intervention at this time".    In review of her medication reconciliation, the patient is not noted to be taking any type of anticoagulation or antiplatelet therapies that would need to be held during her perioperative course.  Patient denies previous perioperative complications with anesthesia in the past.  In review her EMR, there are no records available for review pertaining to past procedural/anesthetic courses within the [facility] system.     01/29/2023    9:00 AM 12/08/2022    3:00 PM 12/08/2022    2:45 PM  Vitals with BMI  Height 5\' 5"     Weight 176 lbs 6 oz    BMI 29.35    Systolic  98 114  Diastolic  70 73  Pulse  61 69    Providers/Specialists:   NOTE: Primary physician provider listed below. Patient may have been seen by APP or partner within same practice.   PROVIDER ROLE / SPECIALTY LAST Delrae Rend, MD Otolaryngology (Surgeon) 12/18/2022  Leilani Able, MD Primary Care Provider  ???  Marca Ancona, MD Cardiology 11/23/2022; update preop APP call on 01/09/2023   Allergies:  Almond (diagnostic), Cherry, Diltiazem,  Losartan, Peach [prunus persica], and Metoprolol  Current Home Medications:   No current facility-administered medications for this encounter.    ALPRAZolam (XANAX) 1 MG tablet   ambrisentan (LETAIRIS) 10 MG tablet   amLODipine (NORVASC) 5 MG tablet   Aspirin-Acetaminophen-Caffeine 500-325-65 MG PACK   buPROPion (WELLBUTRIN SR) 150 MG 12 hr tablet   cetirizine (ZYRTEC) 10 MG tablet   cyclobenzaprine (FLEXERIL) 10 MG tablet   famotidine (PEPCID) 40 MG tablet   furosemide (LASIX) 40 MG tablet   ibuprofen (ADVIL) 200 MG tablet   levonorgestrel (MIRENA) 20 MCG/24HR IUD   levothyroxine (SYNTHROID) 150 MCG tablet   montelukast (SINGULAIR) 10 MG tablet   Selexipag (UPTRAVI) 1400 MCG TABS   sotatercept (WINREVAIR) KIT subcutaneous injection   tadalafil, PAH, (ADCIRCA) 20 MG tablet   History:   Past Medical History:  Diagnosis Date   Anxiety    a.) on BZO (alprazolam) PRN   CHF (congestive heart failure) (HCC) 06/18/2020   a.) TTE 06/18/2020: EF 50-55%, sev LA dil with bowing of IAS towards LA, mod-sev TR, triv MR, G2DD; b.) TTE 10/05/2020: EF 60-65%, mod red RVSF (EF 36%), mod RVE; c.) TTE 10/05/2021: EF 55-60%, mild RVE; d.) TTE 11/23/2022: EF 55-60%, mild RVE, triv MR   Depression    Dyspnea    GERD (gastroesophageal reflux disease)    HCV antibody positive    was reactive in 2021 and then rechecked and was not reactive   History of 2019 novel coronavirus disease (COVID-19) 06/09/2019   History of heroin use    History of narcotic addiction (HCC)    Hypothyroidism (acquired)    IVDU (intravenous drug  user)    Left posterior fascicular block (LPFB)    Left renal artery stenosis (HCC) 06/19/2020   a.) renal US 06/19/2020: 1-59% stenosis LEFT renal artery   Migraines    Multinodular goiter    Pulmonary HTN (HCC) 06/18/2020   a.) Tx'd with ambrisentan + selexipag + sotatercept + tadalafil; b.) TTE 06/18/20: EF 50-55, PA 4 cm, RVSP 108.7; c.) RHC 06/21/20: mRA 6, mPA 74, mPCWP 12,  PA sat 54, AO sat 98, CO 2.64, CI 1.41, PVR 23; d.)TTE 10/05/20: EF 60-65, PASP 53.4; e.)TTE 10/05/21: EF 55-60, PASP 31.5; f.)TTE 11/23/22: EF 55-60, PASP 38.8; g.)RHC 12/08/22: mRA 2, mPA 32, mPCWP 6, PA sat 70, AO sat 97, CO 4.66, CI 2.5, PVR 5.6   Tobacco use    Past Surgical History:  Procedure Laterality Date   CESAREAN SECTION     RIGHT HEART CATH N/A 06/21/2020   Procedure: RIGHT HEART CATH;  Surgeon: Laurey Morale, MD;  Location: [facility] INVASIVE CV LAB;  Service: Cardiovascular;  Laterality: N/A;   RIGHT HEART CATH N/A 12/08/2022   Procedure: RIGHT HEART CATH;  Surgeon: Laurey Morale, MD;  Location: [facility] INVASIVE CV LAB;  Service: Cardiovascular;  Laterality: N/A;   WISDOM TOOTH EXTRACTION     Family History  Problem Relation Age of Onset   Hypertension Paternal Grandfather    Hypertension Mother    Social History   Tobacco Use   Smoking status: Every Day    Packs/day: 0.50    Years: 15.00    Additional pack years: 0.00    Total pack years: 7.50    Types: Cigarettes   Smokeless tobacco: Never   Tobacco comments:    currently smokes a few cigarettes a day-just started on Wellbutrin in hopes to quit smoking prior to upcoming Thyroidectomy  Vaping Use   Vaping Use: Never used  Substance Use Topics   Alcohol use: Yes    Comment: occasionally - 1-2 drinks a few times a week   Drug use: Yes    Comment: Hx of heroine use clean for 3 1/2 years    Pertinent Clinical Results:  LABS:   Hospital Outpatient Visit on 01/30/2023  Component Date Value Ref Range Status   Sodium 01/30/2023 137  135 - 145 mmol/L Final   Potassium 01/30/2023 4.0  3.5 - 5.1 mmol/L Final   Chloride 01/30/2023 106  98 - 111 mmol/L Final   CO2 01/30/2023 23  22 - 32 mmol/L Final   Glucose, Bld 01/30/2023 79  70 - 99 mg/dL Final   Glucose reference range applies only to samples taken after fasting for at least 8 hours.   BUN 01/30/2023 14  6 - 20 mg/dL Final   Creatinine, Ser 01/30/2023 1.16 (H)  0.44 -  1.00 mg/dL Final   Calcium 16/05/9603 8.4 (L)  8.9 - 10.3 mg/dL Final   GFR, Estimated 01/30/2023 >60  >60 mL/min Final   Comment: (NOTE) Calculated using the CKD-EPI Creatinine Equation (2021)    Anion gap 01/30/2023 8  5 - 15 Final   Performed at [facility], [address] [place], [place] [address]    ECG: Date: 11/23/2022 Time ECG obtained: 1500 PM Rate: 75 bpm Rhythm: normal sinus Axis (leads I and aVF): Normal Intervals: PR 156 ms. QRS 84 ms. QTc 455 ms. ST segment and T wave changes: No evidence of acute ST segment elevation or depression Comparison: Similar to previous tracing obtained on 08/01/2022   IMAGING / PROCEDURES: US  THYROID performed on 12/12/2022 Findings suggestive of multinodular goiter.  Marked heterogeneity of the thyroid parenchyma with suspected glandular hyperemia, nonspecific though could be seen in the setting a acute thyroiditis. Clinical correlation is advised. Previously biopsied right-sided thyroid nodule (labeled #1) has minimally increased in size compared to the 2023 examination, measuring 2.4 cm, previously, 2.1 cm. Correlation with previous biopsy results is advised. Assuming a benign pathologic diagnosis, repeat sampling and/or continued dedicated follow-up is not recommended. Previously biopsied left-sided thyroid nodule (labeled #4) has decreased in size compared to the 11/2021 examination, measuring 1.5 cm, previously, 2.0 cm. Correlation with previous biopsy results is advised. Assuming a benign pathologic diagnosis as well as interval reduction in size, repeat sampling and/or continued dedicated follow-up is not recommended. Nodule labeled #3 has increased in size and now meets imaging criteria to recommend percutaneous sampling as indicated. Nodule labeled #2 is unchanged compared to the 11/2021 examination though again meets imaging criteria to recommend percutaneous sampling as indicated.  RIGHT HEART CATHETERIZATION performed  on 12/08/2022 RA mean = 2 mmHg RV = 47/8 mmHg PA = 47/22, mean 32 mmHg PCWP = mean 6 mmHg Oxygen saturations: SVC 62% PA 70% AO 97% Cardiac Output (Fick) = 4.66 L/min Cardiac Index (Fick) = 2.5 L/min/m PVR = 5.6 WU   TRANSTHORACIC ECHOCARDIOGRAM performed on 11/23/2022 Left ventricular ejection fraction, by estimation, is 55 to 60%. The left ventricle has normal function. The left ventricle has no regional wall motion abnormalities. Left ventricular diastolic parameters were normal.  Right ventricular systolic function is normal. The right ventricular size is mildly enlarged. There is mildly elevated pulmonary artery systolic pressure. The estimated right ventricular systolic pressure is 38.8 mmHg.  The mitral valve is normal in structure. Trivial mitral valve regurgitation. No evidence of mitral stenosis.  The aortic valve is tricuspid. Aortic valve regurgitation is not visualized. No aortic stenosis is present.   VAS US RENAL ARTERY DUPLEX performed on 06/19/2020 No evidence of RIGHT renal artery stenosis. RRV flow present.  1-59% stenosis of the LEFT renal artery. LRV flow present.     Impression and Plan:  Zoe Roach has been referred for pre-anesthesia review and clearance prior to her undergoing the planned anesthetic and procedural courses. Available labs, pertinent testing, and imaging results were personally reviewed by me in preparation for upcoming operative/procedural course. [facility] medical record has been updated following extensive record review and patient interview with PAT staff.   This patient has been appropriately cleared by cardiology with an overall MODERATE/ACCEPTABLE risk of experiencing significant perioperative cardiovascular complications. Based on clinical review performed today (02/01/23), barring any significant acute changes in the patient's overall condition, it is anticipated that she will be able to proceed with the planned surgical  intervention. Any acute changes in clinical condition may necessitate her procedure being postponed and/or cancelled. Patient will meet with anesthesia team (MD and/or CRNA) on the day of her procedure for preoperative evaluation/assessment. Questions regarding anesthetic course will be fielded at that time.   Pre-surgical instructions were reviewed with the patient during her PAT appointment, and questions were fielded to satisfaction by PAT clinical staff. She has been instructed on which medications that she will need to hold prior to surgery, as well as the ones that have been deemed safe/appropriate to take on the day of her procedure. As part of the general education provided by PAT, patient made aware both verbally and in writing, that she would need to abstain from the use of any illegal  substances during her perioperative course.  She was advised that failure to follow the provided instructions could necessitate case cancellation or result in serious perioperative complications up to and including death. Patient encouraged to contact PAT and/or her surgeon's office to discuss any questions or concerns that may arise prior to surgery; verbalized understanding.   Quentin Mulling, MSN, APRN, FNP-C, CEN [facility]  Peri-operative Services Nurse Practitioner Phone: [phone] Fax: [phone] 02/01/23 1:38 PM  NOTE: This note has been prepared using Dragon dictation software. Despite my best ability to proofread, there is always the potential that unintentional transcriptional errors may still occur from this process.

## 2023-02-01 ENCOUNTER — Encounter: Payer: Self-pay | Admitting: Unknown Physician Specialty

## 2023-02-06 ENCOUNTER — Other Ambulatory Visit: Payer: Self-pay

## 2023-02-06 ENCOUNTER — Ambulatory Visit: Payer: Medicaid Other | Admitting: Urgent Care

## 2023-02-06 ENCOUNTER — Encounter: Payer: Self-pay | Admitting: Unknown Physician Specialty

## 2023-02-06 ENCOUNTER — Encounter
Admission: RE | Disposition: A | Discharge status: Home / Self Care | Payer: Self-pay | Source: Home / Self Care | Attending: Unknown Physician Specialty

## 2023-02-06 ENCOUNTER — Observation Stay
Admission: RE | Admit: 2023-02-06 | Discharge: 2023-02-07 | Disposition: A | Discharge status: Home / Self Care | Payer: Medicaid Other | Attending: Unknown Physician Specialty | Admitting: Unknown Physician Specialty

## 2023-02-06 DIAGNOSIS — E041 Nontoxic single thyroid nodule: Secondary | ICD-10-CM | POA: Diagnosis not present

## 2023-02-06 DIAGNOSIS — Z01818 Encounter for other preprocedural examination: Secondary | ICD-10-CM

## 2023-02-06 DIAGNOSIS — E890 Postprocedural hypothyroidism: Secondary | ICD-10-CM

## 2023-02-06 HISTORY — DX: Left posterior fascicular block: I44.5

## 2023-02-06 HISTORY — DX: Opioid use, unspecified, in remission: F11.91

## 2023-02-06 HISTORY — PX: THYROIDECTOMY: SHX17

## 2023-02-06 HISTORY — DX: Migraine, unspecified, not intractable, without status migrainosus: G43.909

## 2023-02-06 LAB — HEPATIC FUNCTION PANEL
ALT: 10 U/L (ref 0–44)
AST: 15 U/L (ref 15–41)
Albumin: 3.6 g/dL (ref 3.5–5.0)
Alkaline Phosphatase: 27 U/L — ABNORMAL LOW (ref 38–126)
Bilirubin, Direct: <0.1 mg/dL (ref 0.0–0.2)
Total Bilirubin: 0.6 mg/dL (ref 0.3–1.2)
Total Protein: 6.1 g/dL — ABNORMAL LOW (ref 6.5–8.1)

## 2023-02-06 LAB — PREGNANCY, URINE: Preg Test, Ur: NEGATIVE

## 2023-02-06 LAB — CALCIUM: Calcium: 8.5 mg/dL — ABNORMAL LOW (ref 8.9–10.3)

## 2023-02-06 SURGERY — THYROIDECTOMY
Anesthesia: General | Laterality: Bilateral

## 2023-02-06 MED ORDER — PHENYLEPHRINE HCL-NACL 20-0.9 MG/250ML-% IV SOLN
INTRAVENOUS | Status: DC | PRN
  Administered 2023-02-06: 30 ug/min via INTRAVENOUS

## 2023-02-06 MED ORDER — ALPRAZOLAM 0.5 MG PO TABS: 1.0000 mg | ORAL_TABLET | Freq: Every evening | ORAL | Status: DC | PRN

## 2023-02-06 MED ORDER — SELEXIPAG 1400 MCG PO TABS
1400.0000 ug | ORAL_TABLET | Freq: Two times a day (BID) | ORAL | Status: DC
  Administered 2023-02-07 (×2): 1400 ug via ORAL
  Filled 2023-02-06: qty 1

## 2023-02-06 MED ORDER — LIDOCAINE-EPINEPHRINE 1 %-1:100000 IJ SOLN
INTRAMUSCULAR | Status: DC | PRN
  Administered 2023-02-06: 4 mL

## 2023-02-06 MED ORDER — PHENYLEPHRINE 80 MCG/ML (10ML) SYRINGE FOR IV PUSH (FOR BLOOD PRESSURE SUPPORT)
PREFILLED_SYRINGE | INTRAVENOUS | Status: AC
  Filled 2023-02-06: qty 10

## 2023-02-06 MED ORDER — OXYCODONE HCL 5 MG PO TABS
5.0000 mg | ORAL_TABLET | Freq: Once | ORAL | Status: AC | PRN
  Administered 2023-02-06: 5 mg via ORAL

## 2023-02-06 MED ORDER — PROPOFOL 10 MG/ML IV BOLUS
INTRAVENOUS | Status: DC | PRN
  Administered 2023-02-06: 200 mg via INTRAVENOUS

## 2023-02-06 MED ORDER — MIDAZOLAM HCL 2 MG/2ML IJ SOLN
INTRAMUSCULAR | Status: AC
  Filled 2023-02-06: qty 2

## 2023-02-06 MED ORDER — DEXTROSE-SODIUM CHLORIDE 5-0.45 % IV SOLN: INTRAVENOUS | Status: DC

## 2023-02-06 MED ORDER — OYSTER SHELL CALCIUM/D3 500-5 MG-MCG PO TABS
2.0000 | ORAL_TABLET | Freq: Two times a day (BID) | ORAL | Status: DC
  Administered 2023-02-06 – 2023-02-07 (×3): 2 via ORAL
  Filled 2023-02-06 (×3): qty 2

## 2023-02-06 MED ORDER — 0.9 % SODIUM CHLORIDE (POUR BTL) OPTIME
TOPICAL | Status: DC | PRN
  Administered 2023-02-06: 500 mL

## 2023-02-06 MED ORDER — PROPOFOL 10 MG/ML IV BOLUS
INTRAVENOUS | Status: AC
  Filled 2023-02-06: qty 20

## 2023-02-06 MED ORDER — LEVOTHYROXINE SODIUM 150 MCG PO TABS
150.0000 ug | ORAL_TABLET | Freq: Every day | ORAL | Status: DC
  Administered 2023-02-07: 150 ug via ORAL
  Filled 2023-02-06: qty 1
  Filled 2023-02-06: qty 3

## 2023-02-06 MED ORDER — ORAL CARE MOUTH RINSE: 15.0000 mL | Freq: Once | OROMUCOSAL | Status: AC

## 2023-02-06 MED ORDER — FENTANYL CITRATE (PF) 100 MCG/2ML IJ SOLN
INTRAMUSCULAR | Status: AC
  Filled 2023-02-06: qty 2

## 2023-02-06 MED ORDER — LIDOCAINE HCL (CARDIAC) PF 100 MG/5ML IV SOSY
PREFILLED_SYRINGE | INTRAVENOUS | Status: DC | PRN
  Administered 2023-02-06: 80 mg via INTRAVENOUS

## 2023-02-06 MED ORDER — ALBUTEROL SULFATE HFA 108 (90 BASE) MCG/ACT IN AERS
INHALATION_SPRAY | RESPIRATORY_TRACT | Status: DC | PRN
  Administered 2023-02-06: 6 via RESPIRATORY_TRACT

## 2023-02-06 MED ORDER — AMBRISENTAN 5 MG PO TABS
10.0000 mg | ORAL_TABLET | Freq: Every day | ORAL | Status: DC
  Administered 2023-02-06 – 2023-02-07 (×2): 10 mg via ORAL
  Filled 2023-02-06 (×2): qty 2

## 2023-02-06 MED ORDER — PROPOFOL 1000 MG/100ML IV EMUL
INTRAVENOUS | Status: AC
  Filled 2023-02-06: qty 100

## 2023-02-06 MED ORDER — CHLORHEXIDINE GLUCONATE 0.12 % MT SOLN
15.0000 mL | Freq: Once | OROMUCOSAL | Status: AC
  Administered 2023-02-06: 15 mL via OROMUCOSAL

## 2023-02-06 MED ORDER — ONDANSETRON HCL 4 MG PO TABS: 4.0000 mg | ORAL_TABLET | ORAL | Status: DC | PRN

## 2023-02-06 MED ORDER — ACETAMINOPHEN 325 MG PO TABS: 650.0000 mg | ORAL_TABLET | Freq: Four times a day (QID) | ORAL | Status: DC | PRN

## 2023-02-06 MED ORDER — AMBRISENTAN 5 MG PO TABS: 10.0000 mg | ORAL_TABLET | Freq: Every day | ORAL | Status: DC

## 2023-02-06 MED ORDER — LACTATED RINGERS IV SOLN: INTRAVENOUS | Status: DC

## 2023-02-06 MED ORDER — MIDAZOLAM HCL 2 MG/2ML IJ SOLN
INTRAMUSCULAR | Status: DC | PRN
  Administered 2023-02-06: 2 mg via INTRAVENOUS

## 2023-02-06 MED ORDER — SUCCINYLCHOLINE CHLORIDE 200 MG/10ML IV SOSY
PREFILLED_SYRINGE | INTRAVENOUS | Status: DC | PRN
  Administered 2023-02-06: 120 mg via INTRAVENOUS

## 2023-02-06 MED ORDER — OXYCODONE HCL 5 MG/5ML PO SOLN: 5.0000 mg | Freq: Once | ORAL | Status: AC | PRN

## 2023-02-06 MED ORDER — TADALAFIL 20 MG PO TABS
20.0000 mg | ORAL_TABLET | Freq: Every day | ORAL | Status: DC
  Administered 2023-02-06 – 2023-02-07 (×2): 20 mg via ORAL
  Filled 2023-02-06 (×2): qty 1

## 2023-02-06 MED ORDER — BACITRACIN ZINC 500 UNIT/GM EX OINT
TOPICAL_OINTMENT | Freq: Three times a day (TID) | CUTANEOUS | Status: DC
  Administered 2023-02-06 – 2023-02-07 (×2): 1 via TOPICAL
  Filled 2023-02-06 (×4): qty 0.9

## 2023-02-06 MED ORDER — ALBUTEROL SULFATE HFA 108 (90 BASE) MCG/ACT IN AERS
INHALATION_SPRAY | RESPIRATORY_TRACT | Status: AC
  Filled 2023-02-06: qty 6.7

## 2023-02-06 MED ORDER — BUPROPION HCL ER (SR) 150 MG PO TB12
150.0000 mg | ORAL_TABLET | Freq: Two times a day (BID) | ORAL | Status: DC
  Administered 2023-02-06 – 2023-02-07 (×2): 150 mg via ORAL
  Filled 2023-02-06 (×2): qty 1

## 2023-02-06 MED ORDER — SUCCINYLCHOLINE CHLORIDE 200 MG/10ML IV SOSY
PREFILLED_SYRINGE | INTRAVENOUS | Status: AC
  Filled 2023-02-06: qty 10

## 2023-02-06 MED ORDER — LIDOCAINE-EPINEPHRINE 1 %-1:100000 IJ SOLN
INTRAMUSCULAR | Status: AC
  Filled 2023-02-06: qty 1

## 2023-02-06 MED ORDER — FENTANYL CITRATE (PF) 100 MCG/2ML IJ SOLN
25.0000 ug | INTRAMUSCULAR | Status: DC | PRN
  Administered 2023-02-06: 25 ug via INTRAVENOUS
  Administered 2023-02-06: 50 ug via INTRAVENOUS
  Administered 2023-02-06: 25 ug via INTRAVENOUS

## 2023-02-06 MED ORDER — DEXAMETHASONE SODIUM PHOSPHATE 10 MG/ML IJ SOLN
INTRAMUSCULAR | Status: DC | PRN
  Administered 2023-02-06: 10 mg via INTRAVENOUS

## 2023-02-06 MED ORDER — PROPOFOL 500 MG/50ML IV EMUL
INTRAVENOUS | Status: DC | PRN
  Administered 2023-02-06: 100 ug/kg/min via INTRAVENOUS

## 2023-02-06 MED ORDER — CHLORHEXIDINE GLUCONATE 0.12 % MT SOLN
OROMUCOSAL | Status: AC
  Filled 2023-02-06: qty 15

## 2023-02-06 MED ORDER — OXYCODONE HCL 5 MG PO TABS
ORAL_TABLET | ORAL | Status: AC
  Filled 2023-02-06: qty 1

## 2023-02-06 MED ORDER — ONDANSETRON HCL 4 MG/2ML IJ SOLN
INTRAMUSCULAR | Status: DC | PRN
  Administered 2023-02-06: 4 mg via INTRAVENOUS

## 2023-02-06 MED ORDER — FENTANYL CITRATE (PF) 100 MCG/2ML IJ SOLN
INTRAMUSCULAR | Status: DC | PRN
  Administered 2023-02-06 (×2): 50 ug via INTRAVENOUS

## 2023-02-06 MED ORDER — ONDANSETRON HCL 4 MG/2ML IJ SOLN
INTRAMUSCULAR | Status: AC
  Filled 2023-02-06: qty 2

## 2023-02-06 MED ORDER — PHENYLEPHRINE 80 MCG/ML (10ML) SYRINGE FOR IV PUSH (FOR BLOOD PRESSURE SUPPORT)
PREFILLED_SYRINGE | INTRAVENOUS | Status: DC | PRN
  Administered 2023-02-06 (×2): 80 ug via INTRAVENOUS

## 2023-02-06 MED ORDER — FUROSEMIDE 40 MG PO TABS
40.0000 mg | ORAL_TABLET | Freq: Every day | ORAL | Status: DC
  Administered 2023-02-06 – 2023-02-07 (×2): 40 mg via ORAL
  Filled 2023-02-06 (×2): qty 1

## 2023-02-06 MED ORDER — ONDANSETRON HCL 4 MG/2ML IJ SOLN: 4.0000 mg | INTRAMUSCULAR | Status: DC | PRN

## 2023-02-06 MED ORDER — DEXAMETHASONE SODIUM PHOSPHATE 10 MG/ML IJ SOLN
INTRAMUSCULAR | Status: AC
  Filled 2023-02-06: qty 1

## 2023-02-06 MED ORDER — HYDROCODONE-ACETAMINOPHEN 5-325 MG PO TABS
1.0000 | ORAL_TABLET | ORAL | Status: DC | PRN
  Administered 2023-02-06 – 2023-02-07 (×5): 2 via ORAL
  Filled 2023-02-06 (×5): qty 2

## 2023-02-06 SURGICAL SUPPLY — 43 items
ADH SKN CLS APL DERMABOND .7 (GAUZE/BANDAGES/DRESSINGS) ×1
ATTRACTOMAT 16X20 MAGNETIC DRP (DRAPES) ×1 IMPLANT
BLADE SURG 15 STRL LF DISP TIS (BLADE) ×1 IMPLANT
BLADE SURG 15 STRL SS (BLADE) ×1
BULB RESERV EVAC DRAIN JP 100C (MISCELLANEOUS) IMPLANT
CORD BIP STRL DISP 12FT (MISCELLANEOUS) ×1 IMPLANT
DERMABOND ADVANCED .7 DNX12 (GAUZE/BANDAGES/DRESSINGS) ×1 IMPLANT
DRAIN JP 10F RND SILICONE (MISCELLANEOUS) IMPLANT
DRSG TEGADERM 2-3/8X2-3/4 SM (GAUZE/BANDAGES/DRESSINGS) ×1 IMPLANT
ELECT CAUTERY BLADE TIP 2.5 (TIP) ×1
ELECT LARYNGEAL DUAL CHAN (ELECTRODE) ×1 IMPLANT
ELECT NEEDLE 20X.3 GREEN (MISCELLANEOUS) ×1
ELECT REM PT RETURN 9FT ADLT (ELECTROSURGICAL) ×1
ELECTRODE CAUTERY BLDE TIP 2.5 (TIP) ×1 IMPLANT
ELECTRODE NDL 20X.3 GREEN (MISCELLANEOUS) IMPLANT
ELECTRODE NEEDLE 20X.3 GREEN (MISCELLANEOUS) ×1 IMPLANT
ELECTRODE REM PT RTRN 9FT ADLT (ELECTROSURGICAL) ×1 IMPLANT
FORCEPS JEWEL BIP 4-3/4 STR (INSTRUMENTS) ×1 IMPLANT
GAUZE 4X4 16PLY ~~LOC~~+RFID DBL (SPONGE) ×2 IMPLANT
GLOVE BIO SURGEON STRL SZ7.5 (GLOVE) ×2 IMPLANT
GOWN STRL REUS W/ TWL LRG LVL3 (GOWN DISPOSABLE) ×3 IMPLANT
GOWN STRL REUS W/TWL LRG LVL3 (GOWN DISPOSABLE) ×3
HEMOSTAT SURGICEL 2X3 (HEMOSTASIS) ×1 IMPLANT
HOLSTER ELECTROSUGICAL PENCIL (MISCELLANEOUS) ×1 IMPLANT
HOOK STAY BLUNT/RETRACTOR 5M (MISCELLANEOUS) IMPLANT
KIT TURNOVER KIT A (KITS) ×1 IMPLANT
LABEL OR SOLS (LABEL) ×1 IMPLANT
MANIFOLD NEPTUNE II (INSTRUMENTS) ×1 IMPLANT
NS IRRIG 500ML POUR BTL (IV SOLUTION) ×1 IMPLANT
PACK HEAD/NECK (MISCELLANEOUS) ×1 IMPLANT
PROBE NEUROSIGN BIPOL (MISCELLANEOUS) ×1 IMPLANT
PROBE NEUROSIGN BIPOLAR (MISCELLANEOUS) ×1
SHEARS HARMONIC 9CM CVD (BLADE) ×1 IMPLANT
SOL PREP PVP 2OZ (MISCELLANEOUS) ×1
SOLUTION PREP PVP 2OZ (MISCELLANEOUS) ×1 IMPLANT
SPONGE KITTNER 5P (MISCELLANEOUS) ×1 IMPLANT
STAPLER SKIN PROX 35W (STAPLE) ×1 IMPLANT
SUT SILK 2 0 (SUTURE) ×1
SUT SILK 2 0 SH (SUTURE) ×1 IMPLANT
SUT SILK 2-0 18XBRD TIE 12 (SUTURE) ×1 IMPLANT
SUT VIC AB 4-0 RB1 18 (SUTURE) ×2 IMPLANT
TRAP FLUID SMOKE EVACUATOR (MISCELLANEOUS) ×1 IMPLANT
WATER STERILE IRR 500ML POUR (IV SOLUTION) ×1 IMPLANT

## 2023-02-06 NOTE — Progress Notes (Signed)
Placed order for patient to continue home medication UPTRAVI BID for Pulmonary Hypertension.  Asked for pharmacy to review but no one able in pharmacy to review tonight.  Patient cleared to take her home medication as not available at [facility].

## 2023-02-06 NOTE — Transfer of Care (Signed)
Immediate Anesthesia Transfer of Care Note  Patient: Zoe Roach  Procedure(s) Performed: TOTAL THYROIDECTOMY (Bilateral)  Patient Location: PACU  Anesthesia Type:General  Level of Consciousness: awake, alert , and oriented  Airway & Oxygen Therapy: Patient Spontanous Breathing and Patient connected to nasal cannula oxygen  Post-op Assessment: Report given to RN and Post -op Vital signs reviewed and stable  Post vital signs: stable  Last Vitals:  Vitals Value Taken Time  BP 115/63 02/06/23 0950  Temp    Pulse 101 02/06/23 0958  Resp 13 02/06/23 0958  SpO2 89 % 02/06/23 0958  Vitals shown include unvalidated device data.  Last Pain:  Vitals:   02/06/23 0102  TempSrc: Temporal  PainSc: 0-No pain      Patients Stated Pain Goal: 0 (02/06/23 7253)  Complications: No notable events documented.

## 2023-02-06 NOTE — Op Note (Signed)
OPERATIVE REPORT  DATE OF SURGERY: 02/06/2023  PATIENT:  Zoe Roach,  32 y.o. female  PRE-OPERATIVE DIAGNOSIS:  THYROID NODULE  POST-OPERATIVE DIAGNOSIS:  THYROID NODULE  PROCEDURE:  Procedure(s): TOTAL THYROIDECTOMY; recurrent laryngeal nerve monitoring 2 hours  SURGEON:  Davina Poke, MD  ASSISTANTS: Vaught  ANESTHESIA:   General   EBL: Less than 10 cc ml  DRAINS: None  LOCAL MEDICATIONS USED: 1% lidocaine with 1 to 100,000 units of epinephrine    COUNTS:  Correct  PROCEDURE DETAILS: The patient was taken to the operating room and placed on the operating table in the supine position. A shoulder roll was placed beneath the shoulder blades and the neck was extended.  Incision line was marked transversely in the midline just below the cricoid cartilage.  A local anesthetic of 1% lidocaine with 100,000 units epinephrine is used to inject the incision site a total of 6 cc was used.  The neck was then prepped and draped sterilely.  The neck gently extended a 15 blade was used to incise down through into the platysma muscle.  Hemostasis was achieved using the Bovie cautery.  The strap muscles were identified and divided in the midline.  Beginning on the right-hand side the strap muscles were retracted laterally the harmonic scalpel was used dissect through the fibrofatty tissue down to the thyroid gland.  The dissection proceeded superiorly to the superior pole where the pole was isolated and divided using harmonic scalpel.  The gland was then gently retracted medially.  There was a large nodule in the inferior aspect posteriorly of the gland.  The recurrent laryngeal nerve was identified running over this nodule this was stimulated and remained intact throughout the procedure.  The nerve was gently dissected off of the nodule and retracted laterally.  The gland was then retracted medially superior and inferior parathyroid glands were identified left on the vascular pedicles and  remained intact throughout the procedure.  The gland was then gently dissected over the anterior trachea Berry's ligament was released using the microbipolar.  At the end of the section of the right lobe the recurrent laryngeal nerve was stimulated and remained intact throughout the procedure.  The left side was then examined again the strap muscles were retracted laterally.  The harmonic scalpel was used to dissect the small vessels leading in and about the gland.  The superior pole was isolated and divided using the harmonic scalpel.  This allowed the superior pole to pull down from and superiorly to inferiorly.  There was a large nodule on the posterior aspect of the superior pole which was gently retracted medially.  As the gland was retracted medially the recurrent laryngeal nerve was identified in the tracheoesophageal groove.  This was stimulated and remained and intact throughout the procedure.  The superior and inferior parathyroid glands were identified and also left on the vascular pedicles.  The gland was then retracted medially with care taken not to damage to current angio nerve and its entry into the larynx.  As the gland was retracted medially Berry's ligament was identified and this was divided using the microbipolar.  The gland was then gently dissected off the anterior wall of the trachea and removed in its entirety.  A stitch was placed in the left upper lobe.  With the gland removed the nerve stimulator was used to stimulate each recurrent laryngeal nerve these were intact.  The wound was then copiously irrigated with saline.  Any small bleeding points were cauterized using  the microbipolar.  With no active bleeding Surgicel was placed within the neurovascular bed bilaterally.  I did not feel that any drains were needed at this point.  The strap muscles were reapproximated using 4-0 Vicryl the platysma layer was closed using 4-0 Vicryl the subcutaneous tissues were closed using interrupted 4-0  Vicryl and the skin was closed using Dermabond.  The patient tolerated procedure well was awakened in the operating room taken recovery room in stable condition.  Specimen: Total thyroid stitch left upper lobe  Disposition: Good  Plan: Will admit for overnight observation and monitoring of calcium.

## 2023-02-06 NOTE — Anesthesia Postprocedure Evaluation (Signed)
Anesthesia Post Note  Patient: Zoe Roach  Procedure(s) Performed: TOTAL THYROIDECTOMY (Bilateral)  Patient location during evaluation: PACU Anesthesia Type: General Level of consciousness: awake and alert Pain management: pain level controlled Vital Signs Assessment: post-procedure vital signs reviewed and stable Respiratory status: spontaneous breathing, nonlabored ventilation, respiratory function stable and patient connected to nasal cannula oxygen Cardiovascular status: blood pressure returned to baseline and stable Postop Assessment: no apparent nausea or vomiting Anesthetic complications: no  No notable events documented.   Last Vitals:  Vitals:   02/06/23 1045 02/06/23 1050  BP: 113/84 113/84  Pulse: 86 84  Resp: 13 16  Temp:  36.5 C  SpO2: 92% 92%    Last Pain:  Vitals:   02/06/23 1050  TempSrc:   PainSc: 4                  Stephanie Coup

## 2023-02-06 NOTE — Progress Notes (Signed)
02/06/2023 4:31 PM  Zoe Roach [id number]  Post-Op Day 0    Temp:  [96.7 F (35.9 C)-97.9 F (36.6 C)] 97.9 F (36.6 C) (06/25 1455) Pulse Rate:  [75-100] 85 (06/25 1455) Resp:  [11-18] 18 (06/25 1455) BP: (108-119)/(63-88) 113/88 (06/25 1455) SpO2:  [90 %-96 %] 95 % (06/25 1455) Weight:  [80 kg] 80 kg (06/25 6578),     Intake/Output Summary (Last 24 hours) at 02/06/2023 1631 Last data filed at 02/06/2023 0946 Gross per 24 hour  Intake 600 ml  Output 5 ml  Net 595 ml    Results for orders placed or performed during the hospital encounter of 02/06/23 (from the past 24 hour(s))  Hepatic function panel     Status: Abnormal   Collection Time: 02/06/23 10:35 AM  Result Value Ref Range   Total Protein 6.1 (L) 6.5 - 8.1 g/dL   Albumin 3.6 3.5 - 5.0 g/dL   AST 15 15 - 41 U/L   ALT 10 0 - 44 U/L   Alkaline Phosphatase 27 (L) 38 - 126 U/L   Total Bilirubin 0.6 0.3 - 1.2 mg/dL   Bilirubin, Direct <4.6 0.0 - 0.2 mg/dL   Indirect Bilirubin NOT CALCULATED 0.3 - 0.9 mg/dL  Calcium     Status: Abnormal   Collection Time: 02/06/23 10:35 AM  Result Value Ref Range   Calcium 8.5 (L) 8.9 - 10.3 mg/dL    SUBJECTIVE: Doing well postoperatively some pain but otherwise feeling fine.  OBJECTIVE: Incision clean and dry no evidence of hematoma  IMPRESSION: Status post total thyroidectomy doing well voice sounds good.  Calcium is stable, at 8.5.  Was 8.41-week ago.  Will continue to follow this closely continue calcium supplementation anticipate discharge in the morning assuming her calcium remained stable.  PLAN: Will watch overnight anticipate discharge in a.m.  Davina Poke 02/06/2023, 4:31 PM

## 2023-02-06 NOTE — Anesthesia Preprocedure Evaluation (Signed)
Anesthesia Evaluation  Patient identified by MRN, date of birth, ID band Patient awake    Reviewed: Allergy & Precautions, NPO status , Patient's Chart, lab work & pertinent test results  Airway Mallampati: III  TM Distance: >3 FB Neck ROM: full    Dental  (+) Dental Advidsory Given, Teeth Intact   Pulmonary neg pulmonary ROS, neg shortness of breath, Current Smoker and Patient abstained from smoking.   Pulmonary exam normal        Cardiovascular hypertension, Normal cardiovascular exam(-) dysrhythmias      Neuro/Psych  PSYCHIATRIC DISORDERS Anxiety     negative neurological ROS     GI/Hepatic Neg liver ROS,GERD  Medicated,,  Endo/Other  Hypothyroidism    Renal/GU      Musculoskeletal   Abdominal   Peds  Hematology negative hematology ROS (+)   Anesthesia Other Findings Past Medical History: No date: Anxiety     Comment:  a.) on BZO (alprazolam) PRN 06/18/2020: CHF (congestive heart failure) (HCC)     Comment:  a.) TTE 06/18/2020: EF 50-55%, sev LA dil with bowing of              IAS towards LA, mod-sev TR, triv MR, G2DD; b.) TTE               10/05/2020: EF 60-65%, mod red RVSF (EF 36%), mod RVE;               c.) TTE 10/05/2021: EF 55-60%, mild RVE; d.) TTE               11/23/2022: EF 55-60%, mild RVE, triv MR No date: Depression No date: Dyspnea No date: GERD (gastroesophageal reflux disease) No date: HCV antibody positive     Comment:  was reactive in 2021 and then rechecked and was not               reactive 06/09/2019: History of 2019 novel coronavirus disease (COVID-19) No date: History of heroin use No date: History of narcotic addiction (HCC) No date: Hypothyroidism (acquired) No date: IVDU (intravenous drug user) No date: Left posterior fascicular block (LPFB) 06/19/2020: Left renal artery stenosis (HCC)     Comment:  a.) renal US 06/19/2020: 1-59% stenosis LEFT renal               artery No  date: Migraines No date: Multinodular goiter 06/18/2020: Pulmonary HTN (HCC)     Comment:  a.) Tx'd with ambrisentan + selexipag + sotatercept +               tadalafil; b.) TTE 06/18/20: EF 50-55, PA 4 cm, RVSP               108.7; c.) RHC 06/21/20: mRA 6, mPA 74, mPCWP 12, PA sat               54, AO sat 98, CO 2.64, CI 1.41, PVR 23; d.)TTE 10/05/20:               EF 60-65, PASP 53.4; e.)TTE 10/05/21: EF 55-60, PASP 31.5;              f.)TTE 11/23/22: EF 55-60, PASP 38.8; g.)RHC 12/08/22: mRA               2, mPA 32, mPCWP 6, PA sat 70, AO sat 97, CO 4.66, CI               2.5, PVR 5.6 No date: Tobacco use  Past  Surgical History: No date: CESAREAN SECTION 06/21/2020: RIGHT HEART CATH; N/A     Comment:  Procedure: RIGHT HEART CATH;  Surgeon: Laurey Morale,              MD;  Location: [facility] INVASIVE CV LAB;  Service:               Cardiovascular;  Laterality: N/A; 12/08/2022: RIGHT HEART CATH; N/A     Comment:  Procedure: RIGHT HEART CATH;  Surgeon: Laurey Morale,              MD;  Location: [facility] INVASIVE CV LAB;  Service:               Cardiovascular;  Laterality: N/A; No date: WISDOM TOOTH EXTRACTION  BMI    Body Mass Index: 29.35 kg/m      Reproductive/Obstetrics negative OB ROS                             Anesthesia Physical Anesthesia Plan  ASA: 2  Anesthesia Plan: General ETT   Post-op Pain Management:    Induction: Intravenous  PONV Risk Score and Plan: Ondansetron, Dexamethasone, Midazolam and Treatment may vary due to age or medical condition  Airway Management Planned: Oral ETT  Additional Equipment:   Intra-op Plan:   Post-operative Plan: Extubation in OR  Informed Consent: I have reviewed the patients History and Physical, chart, labs and discussed the procedure including the risks, benefits and alternatives for the proposed anesthesia with the patient or authorized representative who has indicated his/her understanding and  acceptance.     Dental Advisory Given  Plan Discussed with: Anesthesiologist, CRNA and Surgeon  Anesthesia Plan Comments: (Patient consented for risks of anesthesia including but not limited to:  - adverse reactions to medications - damage to eyes, teeth, lips or other oral mucosa - nerve damage due to positioning  - sore throat or hoarseness - Damage to heart, brain, nerves, lungs, other parts of body or loss of life  Patient voiced understanding.)       Anesthesia Quick Evaluation

## 2023-02-06 NOTE — H&P (Signed)
The patient's history has been reviewed, patient examined, no change in status, stable for surgery.  Questions were answered to the patients satisfaction.  

## 2023-02-06 NOTE — Anesthesia Procedure Notes (Signed)
Procedure Name: Intubation Date/Time: 02/06/2023 7:39 AM  Performed by: Emeterio Reeve, CRNAPre-anesthesia Checklist: Patient identified, Emergency Drugs available, Suction available and Patient being monitored Patient Re-evaluated:Patient Re-evaluated prior to induction Oxygen Delivery Method: Circle system utilized Preoxygenation: Pre-oxygenation with 100% oxygen Induction Type: IV induction Ventilation: Mask ventilation without difficulty Laryngoscope Size: McGraph and 4 Grade View: Grade I Tube type: Oral Tube size: 7.5 mm Number of attempts: 2 (Grade one view, cords clear; DL x2 for optimal intubating condition, no trauma; placed under direct vision of ACT and surgeons. CA) Airway Equipment and Method: Stylet and Oral airway Placement Confirmation: ETT inserted through vocal cords under direct vision, positive ETCO2 and breath sounds checked- equal and bilateral Secured at: 22 cm Tube secured with: Tape Dental Injury: Teeth and Oropharynx as per pre-operative assessment

## 2023-02-06 NOTE — Consult Note (Signed)
Monitoring by Pharmacy for Pulmonary Hypertension Treatment   Indication - Continuation of prior to admission medication   Patient is 32 y.o.  with history of PAH on chronic ambrisentan (LETAIRIS) PTA and will be continued while hospitalized.   Continuing this medication order as an inpatient requires that monitoring parameters per REMS requirements must be met.  Chronic therapy is under the supervision of Marca Ancona who is enrolled in the REMS program and is being notified of continuation of therapy. A staff message in EPIC has been sent notifying the certified prescriber.  Per patient report has previously been educated on Pregnancy risk and Hepatotoxicity. On admission pregnancy risk has been assessed and pregnancy test dated 02/06/23 was negative, and oral contraceptive pills (OCP) has been confirmed as birth control option for this patient.  Hepatic function has been evaluated. AST / ALT appropriate to continue medication at this time.     Latest Ref Rng & Units 02/06/2023   10:35 AM 06/22/2020    4:07 AM  Hepatic Function  Total Protein 6.5 - 8.1 g/dL 6.1  6.0   Albumin 3.5 - 5.0 g/dL 3.6  2.9   AST 15 - 41 U/L 15  28   ALT 0 - 44 U/L 10  32   Alk Phosphatase 38 - 126 U/L 27  50   Total Bilirubin 0.3 - 1.2 mg/dL 0.6  0.8   Bilirubin, Direct 0.0 - 0.2 mg/dL <1.6  0.1     If any question arise or pregnancy is identified during hospitalization, contact for bosentan: [phone]; macitentan: [phone]; ambrisentan: [phone].  Thank for you allowing [place] to participate in the care of this patient.  Lowella Bandy 02/06/2023, 6:31 PM Clinical Pharmacist  Guides for Female Patient:  ambrisentan (LETAIRIS), macitentan (OPSUMIT), bosentan (TRACLEER).

## 2023-02-07 ENCOUNTER — Encounter: Payer: Self-pay | Admitting: Unknown Physician Specialty

## 2023-02-07 DIAGNOSIS — E041 Nontoxic single thyroid nodule: Secondary | ICD-10-CM | POA: Diagnosis not present

## 2023-02-07 LAB — CALCIUM
Calcium: 7.8 mg/dL — ABNORMAL LOW (ref 8.9–10.3)
Calcium: 8 mg/dL — ABNORMAL LOW (ref 8.9–10.3)

## 2023-02-07 NOTE — Discharge Summary (Signed)
02/07/2023 2:45 PM  [name] [id number]  Post-Op Day 1   Temp:  [97.5 F (36.4 C)-98.4 F (36.9 C)] 97.5 F (36.4 C) (06/26 0816) Pulse Rate:  [65-85] 65 (06/26 0816) Resp:  [16-18] 18 (06/26 0816) BP: (113-131)/(88-98) 122/95 (06/26 0816) SpO2:  [91 %-96 %] 93 % (06/26 0816),     Intake/Output Summary (Last 24 hours) at 02/07/2023 1445 Last data filed at 02/07/2023 1444 Gross per 24 hour  Intake 864.97 ml  Output --  Net 864.97 ml    Results for orders placed or performed during the hospital encounter of 02/06/23 (from the past 24 hour(s))  Pregnancy, urine     Status: None   Collection Time: 02/06/23  5:48 PM  Result Value Ref Range   Preg Test, Ur NEGATIVE NEGATIVE  Calcium     Status: Abnormal   Collection Time: 02/07/23  4:57 AM  Result Value Ref Range   Calcium 7.8 (L) 8.9 - 10.3 mg/dL  Calcium     Status: Abnormal   Collection Time: 02/07/23  1:14 PM  Result Value Ref Range   Calcium 8.0 (L) 8.9 - 10.3 mg/dL    SUBJECTIVE:  Feeling better  OBJECTIVE:  exam unchanged  IMPRESSION:  Stable  PLAN:  Ca up this afternoon to 8.0.  I think safe to discharge to home.  She is aware of the signs/symptoms of low calcium including perioral tingling and/or hand numbness/tingling.  Any of these signs she should come to the ED for Ca check immediately.  She has oscal at home she will continue.  I will send norco electronically to her pharmacy from my office.    Zoe Roach 02/07/2023, 2:45 PM

## 2023-02-07 NOTE — TOC CM/SW Note (Signed)
Transition of Care [facility]) - Inpatient Brief Assessment   Patient Details  Name: Zoe Roach MRN: [id number] Date of Birth: 1990/11/21  [facility] CM/SW Contact:    Chapman Fitch, RN Phone Number: 02/07/2023, 3:36 PM   Clinical Narrative:    Transition of Care Asessment: Insurance and Status: Insurance coverage has been reviewed Patient has primary care physician: Yes     Prior/Current Home Services: No current home services Social Determinants of Health Reivew: SDOH reviewed no interventions necessary Readmission risk has been reviewed: Yes Transition of care needs: no transition of care needs at this time

## 2023-02-07 NOTE — Progress Notes (Signed)
02/07/2023 7:59 AM  Zoe Roach [id number]  Post-Op Day 1    Temp:  [97.7 F (36.5 C)-98.4 F (36.9 C)] 97.8 F (36.6 C) (06/26 0515) Pulse Rate:  [72-100] 72 (06/26 0515) Resp:  [11-18] 18 (06/26 0515) BP: (108-131)/(63-98) 120/93 (06/26 0515) SpO2:  [90 %-96 %] 91 % (06/26 0515),     Intake/Output Summary (Last 24 hours) at 02/07/2023 0759 Last data filed at 02/07/2023 0400 Gross per 24 hour  Intake 1224.97 ml  Output 5 ml  Net 1219.97 ml    Results for orders placed or performed during the hospital encounter of 02/06/23 (from the past 24 hour(s))  Hepatic function panel     Status: Abnormal   Collection Time: 02/06/23 10:35 AM  Result Value Ref Range   Total Protein 6.1 (L) 6.5 - 8.1 g/dL   Albumin 3.6 3.5 - 5.0 g/dL   AST 15 15 - 41 U/L   ALT 10 0 - 44 U/L   Alkaline Phosphatase 27 (L) 38 - 126 U/L   Total Bilirubin 0.6 0.3 - 1.2 mg/dL   Bilirubin, Direct <4.0 0.0 - 0.2 mg/dL   Indirect Bilirubin NOT CALCULATED 0.3 - 0.9 mg/dL  Calcium     Status: Abnormal   Collection Time: 02/06/23 10:35 AM  Result Value Ref Range   Calcium 8.5 (L) 8.9 - 10.3 mg/dL  Pregnancy, urine     Status: None   Collection Time: 02/06/23  5:48 PM  Result Value Ref Range   Preg Test, Ur NEGATIVE NEGATIVE  Calcium     Status: Abnormal   Collection Time: 02/07/23  4:57 AM  Result Value Ref Range   Calcium 7.8 (L) 8.9 - 10.3 mg/dL    SUBJECTIVE:  Feeling better this am.  Pain improved  OBJECTIVE:  Neck healing nicely, no hematoma noted.  IMPRESSION:  Stable ON.  Calcium down slightly at 7.8.     PLAN: Will saline lock her IV and recheck Ca at approx 1pm. Continue oral supplement.  If Ca stabilizes, will likely discharge later today.  Path pending.    Davina Poke 02/07/2023, 7:59 AM

## 2023-02-21 ENCOUNTER — Other Ambulatory Visit (HOSPITAL_COMMUNITY): Payer: Self-pay

## 2023-02-21 MED ORDER — UPTRAVI 1400 MCG PO TABS: 1400.0000 ug | ORAL_TABLET | Freq: Two times a day (BID) | ORAL | 11 refills | Status: DC

## 2023-04-30 ENCOUNTER — Other Ambulatory Visit (HOSPITAL_COMMUNITY): Payer: Self-pay | Admitting: Cardiology

## 2023-05-07 ENCOUNTER — Other Ambulatory Visit (HOSPITAL_COMMUNITY): Payer: Self-pay

## 2023-05-07 ENCOUNTER — Telehealth (HOSPITAL_COMMUNITY): Payer: Self-pay | Admitting: Pharmacy Technician

## 2023-05-07 NOTE — Telephone Encounter (Signed)
Patient Advocate Encounter   Received notification from [facility] that prior authorization for Zoe Roach is required.   PA submitted on CoverMyMeds Key [id number] Status is pending   Will continue to follow.

## 2023-05-08 ENCOUNTER — Other Ambulatory Visit (HOSPITAL_COMMUNITY): Payer: Self-pay

## 2023-05-08 NOTE — Telephone Encounter (Signed)
Advanced Heart Failure Patient Advocate Encounter  Prior Authorization for Cordie Grice has been approved.    PA# [id number] Effective dates: 05/07/23 through 05/06/24  Archer Asa, CPhT

## 2023-05-14 ENCOUNTER — Encounter: Payer: Self-pay | Admitting: Internal Medicine

## 2023-05-14 ENCOUNTER — Ambulatory Visit (INDEPENDENT_AMBULATORY_CARE_PROVIDER_SITE_OTHER): Payer: Medicaid Other | Admitting: Internal Medicine

## 2023-05-14 VITALS — BP 130/88 | HR 110 | Ht 65.0 in | Wt 182.0 lb

## 2023-05-14 DIAGNOSIS — E559 Vitamin D deficiency, unspecified: Secondary | ICD-10-CM

## 2023-05-14 DIAGNOSIS — E890 Postprocedural hypothyroidism: Secondary | ICD-10-CM | POA: Diagnosis not present

## 2023-05-14 LAB — TSH: TSH: 16.53 u[IU]/mL — ABNORMAL HIGH (ref 0.35–5.50)

## 2023-05-14 LAB — ALBUMIN: Albumin: 4.2 g/dL (ref 3.5–5.2)

## 2023-05-14 LAB — VITAMIN D 25 HYDROXY (VIT D DEFICIENCY, FRACTURES): VITD: 23.97 ng/mL — ABNORMAL LOW (ref 30.00–100.00)

## 2023-05-14 NOTE — Patient Instructions (Signed)

## 2023-05-14 NOTE — Progress Notes (Unsigned)
Name: Zoe Roach  MRN/ DOB: [id number], April 08, 1991    Age/ Sex: 32 y.o., female     PCP: Leilani Able, MD   Reason for Endocrinology Evaluation: Hypothyroidism     Initial Endocrinology Clinic Visit: 08/25/2020    PATIENT IDENTIFIER: Zoe Roach is a 32 y.o., female with a past medical history of HTN , IV drug abuse, Pulmonary HTN and RV failure. She has followed with [facility] since 08/25/2020 for consultative assistance with management of her Elevated catecholamines   HISTORICAL SUMMARY:   During evaluation for HTN/ Pulmonary hypertension and RV failure she was noted to have a slightly elevated (06/2020)  plasma  normetanephrine's 213.9 with normal plasma metanephrine.  Urinary dopamine, epinephrines, metanephrines and normetanephrines have all come back normal with slight elevation of norepinephrine at 141  ug ( reference 0-135 )   Aldo and renin are normal at 2.8 and 1.031 respectively.  Repeat 24-hr urine catecholamine and cortisol were all normal by 08/2020     THYROID HISTORY:   She has been diagnosed with hypothyroidism in the summer of 2021 , she was started On  levothyroxine at the time.    She was subsequently diagnosed with multinodular goiter in April 2023.  Thyroid ultrasound showed multiple thyroid nodules with 2 nodules meeting FNA criteria  She is s/p FNA of right inferior nodule  12/21/2021 with cytology report consistent of follicular lesion of undetermined significance (Bethesda category III) , Afirma was benign  She is s/p FNA of the left inferior nodule 5/10/2023with cytology report consistent of follicular lesion of undetermined significance (Bethesda category III) , Afirma was SUSPICIOUS   Patient was referred for surgical intervention 01/2022   She is S/P total thyroidectomy 02/06/2023, with pathology report consistent with adenomatous nodules.  Diffuse lymphocytic thyroiditis (Hashimoto's)   SUBJECTIVE:    Today  (05/14/2023):  Zoe Roach is here for hypothyroidism and MNG   She is s/p total thyroidectomy 01/2023 Postop course was complicated by hypocalcemia with a nadir of 7.8 Mg/DL She continues to follow with Cardiology for pulmonary HTN  Has noted hoarseness since sx  She denies any cramps but had noted tingling of hands and peri-oral, took a few calcium tabs , symptoms resolved  Denies palpitations  Denies tremors  Denies constipation or diarrhea     Levothyroxine 150 mcg daily     HISTORY:  Past Medical History:  Past Medical History:  Diagnosis Date   Anxiety    a.) on BZO (alprazolam) PRN   CHF (congestive heart failure) (HCC) 06/18/2020   a.) TTE 06/18/2020: EF 50-55%, sev LA dil with bowing of IAS towards LA, mod-sev TR, triv MR, G2DD; b.) TTE 10/05/2020: EF 60-65%, mod red RVSF (EF 36%), mod RVE; c.) TTE 10/05/2021: EF 55-60%, mild RVE; d.) TTE 11/23/2022: EF 55-60%, mild RVE, triv MR   Depression    Dyspnea    GERD (gastroesophageal reflux disease)    HCV antibody positive    was reactive in 2021 and then rechecked and was not reactive   History of 2019 novel coronavirus disease (COVID-19) 06/09/2019   History of heroin use    History of narcotic addiction (HCC)    Hypothyroidism (acquired)    IVDU (intravenous drug user)    Left posterior fascicular block (LPFB)    Left renal artery stenosis (HCC) 06/19/2020   a.) renal US 06/19/2020: 1-59% stenosis LEFT renal artery   Migraines    Multinodular goiter    Pulmonary HTN (HCC)  06/18/2020   a.) Tx'd with ambrisentan + selexipag + sotatercept + tadalafil; b.) TTE 06/18/20: EF 50-55, PA 4 cm, RVSP 108.7; c.) RHC 06/21/20: mRA 6, mPA 74, mPCWP 12, PA sat 54, AO sat 98, CO 2.64, CI 1.41, PVR 23; d.)TTE 10/05/20: EF 60-65, PASP 53.4; e.)TTE 10/05/21: EF 55-60, PASP 31.5; f.)TTE 11/23/22: EF 55-60, PASP 38.8; g.)RHC 12/08/22: mRA 2, mPA 32, mPCWP 6, PA sat 70, AO sat 97, CO 4.66, CI 2.5, PVR 5.6   Tobacco use    Past Surgical History:   Past Surgical History:  Procedure Laterality Date   CESAREAN SECTION     RIGHT HEART CATH N/A 06/21/2020   Procedure: RIGHT HEART CATH;  Surgeon: Laurey Morale, MD;  Location[facility] INVASIVE CV LAB;  Service: Cardiovascular;  Laterality: N/A;   RIGHT HEART CATH N/A 12/08/2022   Procedure: RIGHT HEART CATH;  Surgeon: Laurey Morale, MD;  Location: [facility] INVASIVE CV LAB;  Service: Cardiovascular;  Laterality: N/A;   THYROIDECTOMY Bilateral 02/06/2023   Procedure: TOTAL THYROIDECTOMY;  Surgeon: Linus Salmons, MD;  Location: [facility] ORS;  Service: ENT;  Laterality: Bilateral;   WISDOM TOOTH EXTRACTION     Social History:  reports that she has been smoking cigarettes. She has a 7.5 pack-year smoking history. She has never used smokeless tobacco. She reports current alcohol use. She reports current drug use. Family History:  Family History  Problem Relation Age of Onset   Hypertension Paternal Grandfather    Hypertension Mother      HOME MEDICATIONS: Allergies as of 05/14/2023       Reactions   Almond (diagnostic) Hives   Cherry Hives   Diltiazem Other (See Comments)   Swollen face   Losartan Swelling   Peach [prunus Persica] Hives   Metoprolol Rash        Medication List        Accurate as of May 14, 2023 11:13 AM. If you have any questions, ask your nurse or doctor.          ALPRAZolam 1 MG tablet Commonly known as: XANAX Take 1 mg by mouth 3 (three) times daily as needed for anxiety or sleep.   ambrisentan 10 MG tablet Commonly known as: LETAIRIS Take 1 tablet daily. Generic for [employer].   amLODipine 5 MG tablet Commonly known as: NORVASC Take 1 tablet (5 mg total) by mouth daily. What changed: when to take this   amphetamine-dextroamphetamine 15 MG tablet Commonly known as: ADDERALL Take 1 tablet by mouth daily.   Aspirin-Acetaminophen-Caffeine 500-325-65 MG Pack Take 1 Package by mouth daily as needed (Headache).   buPROPion 150 MG 12 hr  tablet Commonly known as: Wellbutrin SR Take 1 tablet (150 mg total) by mouth 2 (two) times daily.   cetirizine 10 MG tablet Commonly known as: ZYRTEC Take 10 mg by mouth at bedtime.   cyclobenzaprine 10 MG tablet Commonly known as: FLEXERIL Take 10 mg by mouth daily as needed for muscle spasms.   famotidine 40 MG tablet Commonly known as: PEPCID Take 40 mg by mouth every morning.   furosemide 40 MG tablet Commonly known as: LASIX Take 0.5 tablets (20 mg total) by mouth daily. What changed: when to take this   HYDROcodone-acetaminophen 5-325 MG tablet Commonly known as: NORCO/VICODIN Take 1-2 tablets by mouth every 6 (six) hours as needed.   ibuprofen 200 MG tablet Commonly known as: ADVIL Take 400 mg by mouth every 6 (six) hours as needed for headache.   levonorgestrel  20 MCG/24HR IUD Commonly known as: MIRENA 1 each by Intrauterine route once.   levothyroxine 150 MCG tablet Commonly known as: SYNTHROID Take 1 tablet (150 mcg total) by mouth daily. What changed: when to take this   montelukast 10 MG tablet Commonly known as: SINGULAIR Take 10 mg by mouth at bedtime.   Oyster Shell Calcium w/D 500-5 MG-MCG Tabs Take 2 tablets by mouth 2 (two) times daily with a meal.   predniSONE 10 MG tablet Commonly known as: DELTASONE Take 10 mg by mouth 3 (three) times daily.   tadalafil (PAH) 20 MG tablet Commonly known as: ADCIRCA Take 2 tablets (40 mg total) by mouth daily. What changed: when to take this   Uptravi 1400 MCG Tabs Generic drug: Selexipag Take 1,400 mcg by mouth in the morning and at bedtime.   Winrevair subcutaneous injection Generic drug: sotatercept Inject into the skin. What changed: Another medication with the same name was changed. Make sure you understand how and when to take each.   Winrevair Kit subcutaneous injection Generic drug: sotatercept Inject 0.7 mg/kg subcutaneous every 21 days What changed:  how much to take when to take  this          OBJECTIVE:   PHYSICAL EXAM: VS: BP 134/88 (BP Location: Left Arm, Patient Position: Sitting, Cuff Size: Large)   Pulse (!) 110   Ht 5\' 5"  (1.651 m)   Wt 182 lb (82.6 kg)   SpO2 95%   BMI 30.29 kg/m    EXAM: General: Pt appears well and is in NAD  Neck: General: Supple without adenopathy. Thyroid: Thyroid size normal.  No goiter or nodules appreciated. No thyroid bruit.  Lungs: Clear with good BS bilat   Heart: Auscultation: RRR.  Abdomen: soft, nontender  Extremities:  BL LE: No pretibial edema normal ROM and strength.     DATA REVIEWED:  Latest Reference Range & Units 11/08/22 10:11  TSH 0.35 - 5.50 uIU/mL 1.81    Thyroid ultrasound 12/12/2022  FINDINGS: Parenchymal Echotexture: Markedly heterogenous suspected diffuse glandular hyperemia (images 6 and 19).   Isthmus: Normal in size measuring 0.5 cm in diameter, unchanged   Right lobe: Borderline enlarged measuring 5.5 x 2.1 x 2.0 cm, previously, 5.4 x 2.4 x 2.3 cm   Left lobe: Enlarged measuring 6.0 x 2.4 x 1.9 cm, previously, 6.4 x 2.3 x 2.0 cm   _________________________________________________________   Estimated total number of nodules >/= 1 cm: 4   Number of spongiform nodules >/=  2 cm not described below (TR1): 0   Number of mixed cystic and solid nodules >/= 1.5 cm not described below (TR2): 0   _________________________________________________________   Previously biopsied approximately 2.4 x 1.9 x 1.3 cm hypoechoic nodule either within or immediately adjacent to the inferior pole of the right lobe of the thyroid (labeled 1) appears similar to the 11/2021 examination, previously, 2.1 x 1.9 x 1.1 cm. Correlation with previous biopsy results is advised.   _________________________________________________________   Nodule # 2:   Prior biopsy: No   Location: Left; Superior   Maximum size: 1.8 cm; Other 2 dimensions: 1.6 x 1.3 cm, previously, 1.7 x 1.6 x 1.3 cm    Composition: solid/almost completely solid (2)   Echogenicity: hypoechoic (2)   Shape: not taller-than-wide (0)   Margins: ill-defined (0)   Echogenic foci: none (0)   ACR TI-RADS total points: 4.   ACR TI-RADS risk category:  TR4 (4-6 points).   Significant change in size (>/= 20%  in two dimensions and minimal increase of 2 mm): No   Change in features: No   Change in ACR TI-RADS risk category: No   ACR TI-RADS recommendations:   **Given size (>/= 1.5 cm) and appearance, fine needle aspiration of this moderately suspicious nodule should be considered based on TI-RADS criteria.   _________________________________________________________   Nodule # 3:   Prior biopsy: No   Location: Left; Superior   Maximum size: 1.6 cm; Other 2 dimensions: 1.4 x 1.0 cm, previously, 1.2 x 1.0 x 0.7 cm   Composition: solid/almost completely solid (2)   Echogenicity: hypoechoic (2)   Shape: not taller-than-wide (0)   Margins: ill-defined (0)   Echogenic foci: none (0)   ACR TI-RADS total points: 4.   ACR TI-RADS risk category:  TR4 (4-6 points).   Significant change in size (>/= 20% in two dimensions and minimal increase of 2 mm): Yes   Change in features: No   Change in ACR TI-RADS risk category: No   ACR TI-RADS recommendations:   **Given size (>/= 1.5 cm) and appearance, fine needle aspiration of this moderately suspicious nodule should be considered based on TI-RADS criteria.   _________________________________________________________   Previously biopsied now approximately 1.5 x 1.3 x 1.0 cm hypoechoic nodule within the inferior pole of the left lobe of the thyroid (labeled 4), has decreased in size compared to the 11/2021 examination, previously, 2.0 x 1.5 x 0.9 cm. Correlation previous biopsy results is advised.   IMPRESSION: 1. Findings suggestive of multinodular goiter. 2. Marked heterogeneity of the thyroid parenchyma with suspected glandular  hyperemia, nonspecific though could be seen in the setting a acute thyroiditis. Clinical correlation is advised. 3. Previously biopsied right-sided thyroid nodule (labeled #1) has minimally increased in size compared to the 2023 examination, measuring 2.4 cm, previously, 2.1 cm. Correlation with previous biopsy results is advised. Assuming a benign pathologic diagnosis, repeat sampling and/or continued dedicated follow-up is not recommended. 4. Previously biopsied left-sided thyroid nodule (labeled #4) has decreased in size compared to the 11/2021 examination, measuring 1.5 cm, previously, 2.0 cm. Correlation with previous biopsy results is advised. Assuming a benign pathologic diagnosis as well as interval reduction in size, repeat sampling and/or continued dedicated follow-up is not recommended. 5. Nodule labeled #3 has increased in size and now meets imaging criteria to recommend percutaneous sampling as indicated. 6. Nodule labeled #2 is unchanged compared to the 11/2021 examination though again meets imaging criteria to recommend percutaneous sampling as indicated.  FNA left inferior nodule 12/21/2021  Clinical History: Nodule #4: Left; Inferior, Maximum size: 2.0 cm; Other  2 dimensions: 1.5 x 0.9 cm, solid/almost completely solid, hypoechoic,  TI-RADS total points: 4.  Specimen Submitted:  A. 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 Specimen Submitted:  A. THYROID, RT INFERIOR, FINE NEEDLE ASPIRATION:   FINAL MICROSCOPIC DIAGNOSIS: - Follicular lesion of undetermined significance (Bethesda category III)   Afirma Benign    ASSESSMENT / PLAN / RECOMMENDATIONS:  Postoperative hypothyroidism :    -S/p total thyroidectomy 01/2023 with benign pathology -No local neck symptoms  -Repeat TSH is normal   Medications :   Continue levothyroxine 150 mcg daily    2.  Hypocalcemia:  F/U in 6 months     Signed electronically by: Lyndle Herrlich, MD  [facility]  [facility] [address] [place], [place] [address] Phone: [phone] FAX: [phone]      CC: Leilani Able, MD [address] [place] [address] Phone: [phone]  Fax: [phone]   Return to Endocrinology clinic as below: No future appointments.

## 2023-05-15 DIAGNOSIS — E559 Vitamin D deficiency, unspecified: Secondary | ICD-10-CM | POA: Insufficient documentation

## 2023-05-15 LAB — PTH, INTACT AND CALCIUM
Calcium: 8.7 mg/dL (ref 8.6–10.2)
PTH: 57 pg/mL (ref 16–77)

## 2023-05-15 MED ORDER — LEVOTHYROXINE SODIUM 175 MCG PO TABS: 175.0000 ug | ORAL_TABLET | Freq: Every day | ORAL | 3 refills | Status: DC

## 2023-06-15 ENCOUNTER — Telehealth (HOSPITAL_COMMUNITY): Payer: Self-pay | Admitting: Pharmacy Technician

## 2023-06-15 NOTE — Telephone Encounter (Signed)
Patient Advocate Encounter   Received notification from [facility] that prior authorization for Ambrisentan is required.   PA submitted on CoverMyMeds Key [id number] Status is pending   Will continue to follow.

## 2023-06-20 ENCOUNTER — Other Ambulatory Visit (HOSPITAL_COMMUNITY): Payer: Self-pay

## 2023-06-20 NOTE — Telephone Encounter (Signed)
Advanced Heart Failure Patient Advocate Encounter  Prior Authorization for Ambrisentan has been approved.    PA# [id number] Effective dates: 06/15/23 through 06/14/24  Patients co-pay is $4

## 2023-06-29 ENCOUNTER — Other Ambulatory Visit (HOSPITAL_COMMUNITY): Payer: Self-pay | Admitting: Pharmacy Technician

## 2023-06-29 ENCOUNTER — Other Ambulatory Visit (HOSPITAL_COMMUNITY): Payer: Self-pay

## 2023-06-29 ENCOUNTER — Encounter (HOSPITAL_COMMUNITY): Payer: Self-pay

## 2023-06-29 ENCOUNTER — Other Ambulatory Visit (HOSPITAL_COMMUNITY): Payer: Self-pay | Admitting: Pharmacist

## 2023-06-29 ENCOUNTER — Other Ambulatory Visit: Payer: Self-pay

## 2023-06-29 MED ORDER — AMBRISENTAN 10 MG PO TABS
10.0000 mg | ORAL_TABLET | Freq: Every day | ORAL | 11 refills | Status: DC
  Filled 2023-07-02: qty 30, 30d supply, fill #0
  Filled 2023-07-20: qty 30, 30d supply, fill #1
  Filled 2023-08-23: qty 30, 30d supply, fill #2
  Filled 2023-09-26 – 2023-09-28 (×3): qty 30, 30d supply, fill #3
  Filled 2023-10-23: qty 30, 30d supply, fill #4
  Filled 2023-11-22: qty 30, 30d supply, fill #5
  Filled 2023-12-25: qty 30, 30d supply, fill #6
  Filled 2024-01-23 – 2024-02-07 (×3): qty 30, 30d supply, fill #7
  Filled 2024-02-28 – 2024-03-03 (×2): qty 30, 30d supply, fill #8
  Filled 2024-03-28: qty 30, 30d supply, fill #9
  Filled 2024-04-24: qty 30, 30d supply, fill #10
  Filled 2024-05-22: qty 30, 30d supply, fill #11

## 2023-06-29 NOTE — Progress Notes (Addendum)
Specialty Pharmacy Initial Fill Coordination Note  Zoe Roach is a 32 y.o. female contacted today regarding refills of specialty medication(s) Ambrisentan   Patient requested Delivery   Delivery date: 07/03/23   Verified address: [address] [place] [address]   Medication will be filled on Monday 07/02/23.   Patient is aware of $4 copayment.   Patient REMS ID [id number]  Dr. Shirlee Latch REMS ID [id number]  Auth number [id number] good til 11/22  Pregnancy test taken 10/26.  Archer Asa, CPhT

## 2023-06-29 NOTE — Progress Notes (Signed)
Specialty Pharmacy Initiation Note   Zoe Roach is a 32 y.o. female who will be followed by the specialty pharmacy service for RxSp Cardiology    Review of administration, indication, effectiveness, safety, potential side effects, storage/disposable, and missed dose instructions occurred today for patient's specialty medication(s) Ambrisentan     Patient/Caregiver did not have any additional questions or concerns.   Patient's therapy is appropriate to: Continue    Goals Addressed             This Visit's Progress    Maintain optimal adherence to therapy       Patient is on track. Patient will maintain adherence      Maintain or improve exercise capacity (6 minute walk)       Patient is on track. Patient will maintain adherence and be monitored by provider to determine if a change in treatment plan is warranted         Evon Slack Specialty Pharmacist

## 2023-06-29 NOTE — Progress Notes (Deleted)
Pregnancy test taken 10/26.  Archer Asa, CPhT

## 2023-07-02 ENCOUNTER — Other Ambulatory Visit: Payer: Self-pay

## 2023-07-02 ENCOUNTER — Other Ambulatory Visit (HOSPITAL_COMMUNITY): Payer: Self-pay

## 2023-07-02 ENCOUNTER — Encounter (HOSPITAL_COMMUNITY): Payer: Self-pay

## 2023-07-02 NOTE — Telephone Encounter (Signed)
Advanced Heart Failure Patient Advocate Encounter  Patient successfully enrolled in [facility] specialty pharmacy services. The medication will be shipped to her today.  Archer Asa, CPhT

## 2023-07-02 NOTE — Progress Notes (Signed)
Advanced Heart Failure Patient Advocate Encounter  Transaction ID for REMS loopback submission, [id number].  Zoe Roach, CPhT

## 2023-07-04 ENCOUNTER — Other Ambulatory Visit: Payer: Self-pay

## 2023-07-19 ENCOUNTER — Other Ambulatory Visit: Payer: Self-pay

## 2023-07-20 ENCOUNTER — Other Ambulatory Visit: Payer: Self-pay

## 2023-07-20 ENCOUNTER — Other Ambulatory Visit (HOSPITAL_COMMUNITY): Payer: Self-pay

## 2023-07-20 NOTE — Progress Notes (Signed)
Specialty Pharmacy Refill Coordination Note  Zoe Roach is a 32 y.o. female contacted today regarding refills of specialty medication(s) Ambrisentan   Patient requested [name] at [facility] at [place] date: 07/30/23   Medication will be filled on 07/27/23.   Patient REMS ID [id number]  Dr. Alford Highland REMS ID [id number]  Pregnancy test taken 07/06/23.  Calling for authorization 07/23/23.  Transaction ID will be 1.  Dispense Authorization: [id number]  Expires: 07/31/23.

## 2023-07-21 ENCOUNTER — Other Ambulatory Visit (HOSPITAL_COMMUNITY): Payer: Self-pay

## 2023-07-23 ENCOUNTER — Other Ambulatory Visit (HOSPITAL_COMMUNITY): Payer: Self-pay

## 2023-07-23 ENCOUNTER — Other Ambulatory Visit: Payer: Self-pay

## 2023-07-24 ENCOUNTER — Other Ambulatory Visit: Payer: Self-pay

## 2023-07-24 ENCOUNTER — Other Ambulatory Visit (HOSPITAL_COMMUNITY): Payer: Self-pay

## 2023-07-25 ENCOUNTER — Other Ambulatory Visit (HOSPITAL_COMMUNITY): Payer: Self-pay

## 2023-07-25 ENCOUNTER — Other Ambulatory Visit (HOSPITAL_COMMUNITY): Payer: Self-pay | Admitting: Cardiology

## 2023-07-25 ENCOUNTER — Other Ambulatory Visit: Payer: Self-pay

## 2023-07-25 DIAGNOSIS — I272 Pulmonary hypertension, unspecified: Secondary | ICD-10-CM

## 2023-07-27 ENCOUNTER — Other Ambulatory Visit: Payer: Self-pay

## 2023-07-27 NOTE — Progress Notes (Signed)
Loopback completed, Transaction ID [id number]

## 2023-07-30 ENCOUNTER — Other Ambulatory Visit (HOSPITAL_COMMUNITY): Payer: Self-pay

## 2023-08-07 ENCOUNTER — Other Ambulatory Visit (HOSPITAL_COMMUNITY): Payer: Self-pay | Admitting: Cardiology

## 2023-08-09 ENCOUNTER — Other Ambulatory Visit (HOSPITAL_COMMUNITY): Payer: Self-pay | Admitting: Cardiology

## 2023-08-22 ENCOUNTER — Other Ambulatory Visit: Payer: Self-pay

## 2023-08-23 ENCOUNTER — Other Ambulatory Visit: Payer: Self-pay

## 2023-08-23 ENCOUNTER — Other Ambulatory Visit (HOSPITAL_COMMUNITY): Payer: Self-pay

## 2023-08-23 NOTE — Progress Notes (Signed)
 Specialty Pharmacy Refill Coordination Note  Zoe Roach is a 33 y.o. female contacted today regarding refills of specialty medication(s) Ambrisentan  (LETAIRIS )   Patient requested Marylyn at [facility] at [place] date: 08/28/23   Medication will be filled on 01.13.25.   Rems auth # : [id number] good until 1.16.25

## 2023-08-24 ENCOUNTER — Other Ambulatory Visit (HOSPITAL_COMMUNITY): Payer: Self-pay

## 2023-08-25 ENCOUNTER — Other Ambulatory Visit (HOSPITAL_COMMUNITY): Payer: Self-pay

## 2023-08-27 ENCOUNTER — Other Ambulatory Visit: Payer: Self-pay

## 2023-08-28 ENCOUNTER — Other Ambulatory Visit: Payer: Self-pay

## 2023-08-28 ENCOUNTER — Other Ambulatory Visit (HOSPITAL_COMMUNITY): Payer: Self-pay

## 2023-08-28 ENCOUNTER — Other Ambulatory Visit (HOSPITAL_BASED_OUTPATIENT_CLINIC_OR_DEPARTMENT_OTHER): Payer: Self-pay

## 2023-08-29 ENCOUNTER — Other Ambulatory Visit: Payer: Self-pay

## 2023-08-29 ENCOUNTER — Other Ambulatory Visit (HOSPITAL_COMMUNITY): Payer: Self-pay | Admitting: Cardiology

## 2023-08-29 NOTE — Progress Notes (Signed)
 Abrisentan order delivered to Specialty Pharmacy 08/29/23.  Rems auth # : [id number] good until 1.16.25  Transaction ID: [id number]  Abristentan toted and couriered to [facility] for PPU on 08/30/23. Spoke with patient and confirmed updated pick up date. Confirmed $4.00. No questions or concerns at this time.

## 2023-08-30 ENCOUNTER — Other Ambulatory Visit (HOSPITAL_COMMUNITY): Payer: Self-pay

## 2023-08-31 ENCOUNTER — Other Ambulatory Visit: Payer: Self-pay

## 2023-09-17 ENCOUNTER — Other Ambulatory Visit (HOSPITAL_COMMUNITY): Payer: Self-pay | Admitting: Cardiology

## 2023-09-18 MED ORDER — FUROSEMIDE 40 MG PO TABS: 20.0000 mg | ORAL_TABLET | Freq: Every day | ORAL | 7 refills | Status: AC

## 2023-09-18 NOTE — Addendum Note (Signed)
Addended by: Noralee Space on: 09/18/2023 12:50 PM   Modules accepted: Orders

## 2023-09-19 ENCOUNTER — Other Ambulatory Visit (HOSPITAL_COMMUNITY): Payer: Self-pay

## 2023-09-19 ENCOUNTER — Other Ambulatory Visit: Payer: Self-pay

## 2023-09-26 ENCOUNTER — Other Ambulatory Visit (HOSPITAL_COMMUNITY): Payer: Self-pay | Admitting: Cardiology

## 2023-09-26 ENCOUNTER — Telehealth (HOSPITAL_COMMUNITY): Payer: Self-pay | Admitting: Pharmacy Technician

## 2023-09-26 ENCOUNTER — Other Ambulatory Visit: Payer: Self-pay

## 2023-09-26 ENCOUNTER — Telehealth (HOSPITAL_COMMUNITY): Payer: Self-pay | Admitting: Vascular Surgery

## 2023-09-26 ENCOUNTER — Other Ambulatory Visit (HOSPITAL_COMMUNITY): Payer: Self-pay

## 2023-09-26 MED ORDER — TADALAFIL (PAH) 20 MG PO TABS
40.0000 mg | ORAL_TABLET | Freq: Every day | ORAL | 3 refills | Status: DC
  Filled 2023-09-26: qty 180, 90d supply, fill #0
  Filled 2023-09-26: qty 60, 30d supply, fill #0
  Filled 2023-09-26: qty 180, 90d supply, fill #0

## 2023-09-26 MED ORDER — TADALAFIL (PAH) 20 MG PO TABS
40.0000 mg | ORAL_TABLET | Freq: Every day | ORAL | 3 refills | Status: DC
  Filled 2023-09-26: qty 90, 45d supply, fill #0

## 2023-09-26 NOTE — Progress Notes (Signed)
Specialty Pharmacy Refill Coordination Note  DORINNE GRAEFF is a 33 y.o. female contacted today regarding refills of specialty medication(s) Ambrisentan [name]   Patient requested [name] at [facility] at [place] date: 09/28/23   Medication will be filled on 09/27/23.   Negative pregnancy test 09/26/23.

## 2023-09-26 NOTE — Addendum Note (Signed)
Addended by: Theresia Bough on: 09/26/2023 02:04 PM   Modules accepted: Orders

## 2023-09-26 NOTE — Telephone Encounter (Signed)
Advanced Heart Failure Patient Advocate Encounter  Received message from the specialty pharmacy that the patient is currently an inmate in [place] [facility]. Not sure how to get her the Ambrisentan. I called the facility and spoke with Gabriel Rung (medical supervisor, [phone]) several times. The facility will give her a pregnancy test every month and will send the results to the office. The patients significant other will take the Ambrisentan and Tadalafil to the facility. The facility has agreed to this process. Received negative pregnancy test today. Sent 90 day RX request to Chantel (CMA) to send to [facility].   She should be able to continue to refill Uptravi through [facility].  When I brought up Winrevair and the process the facility would need to go by, Joe stated that the patient did not want to receive it while she is there.   Spent several hours coordinating this process.  Archer Asa, CPhT

## 2023-09-27 ENCOUNTER — Other Ambulatory Visit: Payer: Self-pay

## 2023-09-27 ENCOUNTER — Other Ambulatory Visit (HOSPITAL_COMMUNITY): Payer: Self-pay

## 2023-09-27 NOTE — Progress Notes (Signed)
09/27/23 REMS Authorization Code: [id number] Exp date 10/04/23

## 2023-09-27 NOTE — Progress Notes (Signed)
Spoke with Ivin Booty. He is okay with getting the ambrisentan same day couriered on Friday 09/28/23.   [facility] at [facility] will place ambrisentan in totes and return RX back to [facility].   Specialty Pharmacy will same day courier to address provided by Ivin Booty (added to temporary field)  [address] [place], [place] [address]  Best number to reach [place]: [phone]

## 2023-09-27 NOTE — Progress Notes (Signed)
LVM, because of REMS requirements medication must be shipped.  Verify shipping address and SAME DAY COURIER when he calls back.

## 2023-09-28 ENCOUNTER — Other Ambulatory Visit (HOSPITAL_COMMUNITY): Payer: Self-pay

## 2023-09-28 ENCOUNTER — Other Ambulatory Visit: Payer: Self-pay

## 2023-09-28 NOTE — Progress Notes (Signed)
09/28/23-CA: Called [facility] and spoke with Dahlia Client regarding ambrisentan. She is letting the driver know to leave package at the door. 'Signature required' may have been placed on the package label by mistake.

## 2023-09-29 ENCOUNTER — Other Ambulatory Visit (HOSPITAL_COMMUNITY): Payer: Self-pay

## 2023-10-01 ENCOUNTER — Other Ambulatory Visit (HOSPITAL_COMMUNITY): Payer: Self-pay

## 2023-10-03 ENCOUNTER — Other Ambulatory Visit (HOSPITAL_COMMUNITY): Payer: Self-pay

## 2023-10-04 ENCOUNTER — Other Ambulatory Visit (HOSPITAL_COMMUNITY): Payer: Self-pay

## 2023-10-04 ENCOUNTER — Other Ambulatory Visit: Payer: Self-pay

## 2023-10-16 ENCOUNTER — Ambulatory Visit: Payer: Medicaid Other | Admitting: Internal Medicine

## 2023-10-16 NOTE — Progress Notes (Deleted)
 Name: Zoe Roach  MRN/ DOB: [id number], 11/24/1990    Age/ Sex: 33 y.o., female     PCP: Leilani Able, MD   Reason for Endocrinology Evaluation: Hypothyroidism     Initial Endocrinology Clinic Visit: 08/25/2020    PATIENT IDENTIFIER: [name] is a 33 y.o., female with a past medical history of HTN , IV drug abuse, Pulmonary HTN and RV failure. She has followed with [facility] since 08/25/2020 for consultative assistance with management of her Elevated catecholamines   HISTORICAL SUMMARY:   During evaluation for HTN/ Pulmonary hypertension and RV failure she was noted to have a slightly elevated (06/2020)  plasma  normetanephrine's 213.9 with normal plasma metanephrine.  Urinary dopamine, epinephrines, metanephrines and normetanephrines have all come back normal with slight elevation of norepinephrine at 141  ug ( reference 0-135 )   Aldo and renin are normal at 2.8 and 1.031 respectively.  Repeat 24-hr urine catecholamine and cortisol were all normal by 08/2020     THYROID HISTORY:   She has been diagnosed with hypothyroidism in the summer of 2021 , she was started On  levothyroxine at the time.    She was subsequently diagnosed with multinodular goiter in April 2023.  Thyroid ultrasound showed multiple thyroid nodules with 2 nodules meeting FNA criteria  She is s/p FNA of right inferior nodule  12/21/2021 with cytology report consistent of follicular lesion of undetermined significance (Bethesda category III) , Afirma was benign  She is s/p FNA of the left inferior nodule 5/10/2023with cytology report consistent of follicular lesion of undetermined significance (Bethesda category III) , Afirma was SUSPICIOUS   Patient was referred for surgical intervention 01/2022   She is S/P total thyroidectomy 02/06/2023, with pathology report consistent with adenomatous nodules.  Diffuse lymphocytic thyroiditis (Hashimoto's)   SUBJECTIVE:    Today  (10/16/2023):  [name] is here for hypothyroidism and MNG   She is s/p total thyroidectomy 01/2023 She continues to follow with Cardiology for pulmonary HTN  Has noted hoarseness since sx  She denies any cramps but had noted tingling of hands and peri-oral, took a few calcium tabs , symptoms resolved  Denies palpitations  Denies tremors  Denies constipation or diarrhea     Levothyroxine 175 mcg daily     HISTORY:  Past Medical History:  Past Medical History:  Diagnosis Date   Anxiety    a.) on BZO (alprazolam) PRN   CHF (congestive heart failure) (HCC) 06/18/2020   a.) TTE 06/18/2020: EF 50-55%, sev LA dil with bowing of IAS towards LA, mod-sev TR, triv MR, G2DD; b.) TTE 10/05/2020: EF 60-65%, mod red RVSF (EF 36%), mod RVE; c.) TTE 10/05/2021: EF 55-60%, mild RVE; d.) TTE 11/23/2022: EF 55-60%, mild RVE, triv MR   Depression    Dyspnea    GERD (gastroesophageal reflux disease)    HCV antibody positive    was reactive in 2021 and then rechecked and was not reactive   History of 2019 novel coronavirus disease (COVID-19) 06/09/2019   History of heroin use    History of narcotic addiction (HCC)    Hypothyroidism (acquired)    IVDU (intravenous drug user)    Left posterior fascicular block (LPFB)    Left renal artery stenosis (HCC) 06/19/2020   a.) renal US 06/19/2020: 1-59% stenosis LEFT renal artery   Migraines    Multinodular goiter    Pulmonary HTN (HCC) 06/18/2020   a.) Tx'd with ambrisentan + selexipag + sotatercept +  tadalafil; b.) TTE 06/18/20: EF 50-55, PA 4 cm, RVSP 108.7; c.) RHC 06/21/20: mRA 6, mPA 74, mPCWP 12, PA sat 54, AO sat 98, CO 2.64, CI 1.41, PVR 23; d.)TTE 10/05/20: EF 60-65, PASP 53.4; e.)TTE 10/05/21: EF 55-60, PASP 31.5; f.)TTE 11/23/22: EF 55-60, PASP 38.8; g.)RHC 12/08/22: mRA 2, mPA 32, mPCWP 6, PA sat 70, AO sat 97, CO 4.66, CI 2.5, PVR 5.6   Tobacco use    Past Surgical History:  Past Surgical History:  Procedure Laterality Date   CESAREAN SECTION      RIGHT HEART CATH N/A 06/21/2020   Procedure: RIGHT HEART CATH;  Surgeon: Laurey Morale, MD;  [facility] INVASIVE CV LAB;  Service: Cardiovascular;  Laterality: N/A;   RIGHT HEART CATH N/A 12/08/2022   Procedure: RIGHT HEART CATH;  Surgeon: Laurey Morale, MD;  Location: [facility] INVASIVE CV LAB;  Service: Cardiovascular;  Laterality: N/A;   THYROIDECTOMY Bilateral 02/06/2023   Procedure: TOTAL THYROIDECTOMY;  Surgeon: Linus Salmons, MD;  Location: [facility] ORS;  Service: ENT;  Laterality: Bilateral;   WISDOM TOOTH EXTRACTION     Social History:  reports that she has been smoking cigarettes. She has a 7.5 pack-year smoking history. She has never used smokeless tobacco. She reports current alcohol use. She reports current drug use. Family History:  Family History  Problem Relation Age of Onset   Hypertension Paternal Grandfather    Hypertension Mother      HOME MEDICATIONS: Allergies as of 10/16/2023       Reactions   Almond (diagnostic) Hives   Cherry Hives   Diltiazem Other (See Comments)   Swollen face   Losartan Swelling   Peach [prunus Persica] Hives   Metoprolol Rash        Medication List        Accurate as of October 16, 2023  6:45 AM. If you have any questions, ask your nurse or doctor.          ALPRAZolam 1 MG tablet Commonly known as: XANAX Take 1 mg by mouth 3 (three) times daily as needed for anxiety or sleep.   ambrisentan 10 MG tablet Commonly known as: LETAIRIS Take 1 tablet (10 mg total) by mouth daily.   amLODipine 5 MG tablet Commonly known as: NORVASC TAKE 1 TABLET (5 MG TOTAL) BY MOUTH DAILY.   amphetamine-dextroamphetamine 15 MG tablet Commonly known as: ADDERALL Take 1 tablet by mouth daily.   Aspirin-Acetaminophen-Caffeine 500-325-65 MG Pack Take 1 Package by mouth daily as needed (Headache).   buPROPion 150 MG 12 hr tablet Commonly known as: Wellbutrin SR Take 1 tablet (150 mg total) by mouth 2 (two) times daily.   cetirizine 10 MG  tablet Commonly known as: ZYRTEC Take 10 mg by mouth at bedtime.   cyclobenzaprine 10 MG tablet Commonly known as: FLEXERIL Take 10 mg by mouth daily as needed for muscle spasms.   famotidine 40 MG tablet Commonly known as: PEPCID Take 40 mg by mouth every morning.   furosemide 40 MG tablet Commonly known as: LASIX Take 0.5 tablets (20 mg total) by mouth daily.   HYDROcodone-acetaminophen 5-325 MG tablet Commonly known as: NORCO/VICODIN Take 1-2 tablets by mouth every 6 (six) hours as needed.   ibuprofen 200 MG tablet Commonly known as: ADVIL Take 400 mg by mouth every 6 (six) hours as needed for headache.   levonorgestrel 20 MCG/24HR IUD Commonly known as: MIRENA 1 each by Intrauterine route once.   levothyroxine 175 MCG tablet Commonly known  as: SYNTHROID Take 1 tablet (175 mcg total) by mouth daily.   montelukast 10 MG tablet Commonly known as: SINGULAIR Take 10 mg by mouth at bedtime.   Oyster Shell Calcium w/D 500-5 MG-MCG Tabs Take 2 tablets by mouth 2 (two) times daily with a meal.   predniSONE 10 MG tablet Commonly known as: DELTASONE Take 10 mg by mouth 3 (three) times daily.   tadalafil (PAH) 20 MG tablet Commonly known as: ADCIRCA Take 2 tablets (40 mg total) by mouth daily.   Uptravi 1400 MCG Tabs Generic drug: Selexipag Take 1,400 mcg by mouth in the morning and at bedtime.   Winrevair subcutaneous injection Generic drug: sotatercept Inject into the skin.   Winrevair Kit subcutaneous injection Generic drug: sotatercept INJECT 1.1 ML SUBCUTANEOUSLY ONCE EVERY 3 WEEKS          OBJECTIVE:   PHYSICAL EXAM: VS: There were no vitals taken for this visit.   EXAM: General: Pt appears well and is in NAD  Neck: General: Supple without adenopathy. Thyroid: Thyroid size normal.  No goiter or nodules appreciated.   Lungs: Clear with good BS bilat   Heart: Auscultation: RRR.  Abdomen: soft, nontender  Extremities:  BL LE: No pretibial edema  normal ROM and strength.     DATA REVIEWED:    ASSESSMENT / PLAN / RECOMMENDATIONS:  Postoperative hypothyroidism :    -S/p total thyroidectomy 01/2023 with benign pathology -No local neck symptoms  -Repeat TSH is  elevated, will increase levothyroxine and recheck in 2 months   Medications :  Stop levothyroxine 150 mcg daily  Start levothyroxine 175 mcg daily     F/U in 6 months     Signed electronically by: Lyndle Herrlich, MD  [facility] Endocrinology  [facility] [address] [place] [place], [place] [address] Phone: [phone] FAX: [phone]      CC: Leilani Able, MD [address] [place] [address] Phone: [phone]  Fax: [phone]   Return to Endocrinology clinic as below: Future Appointments  Date Time Provider Department Center  10/16/2023  8:50 AM Deanie Jupiter, Konrad Dolores, MD [facility] None  12/04/2023 10:00 AM [facility] OP 1 [facility]  12/04/2023 11:00 AM Laurey Morale, MD [facility] None

## 2023-10-17 ENCOUNTER — Other Ambulatory Visit: Payer: Self-pay

## 2023-10-23 ENCOUNTER — Other Ambulatory Visit (HOSPITAL_COMMUNITY): Payer: Self-pay

## 2023-10-23 ENCOUNTER — Other Ambulatory Visit: Payer: Self-pay

## 2023-10-23 NOTE — Progress Notes (Signed)
 Specialty Pharmacy Refill Coordination Note  Zoe Roach is a 33 y.o. female contacted today regarding refills of specialty medication(s) Ambrisentan [name]   Patient requested [name] at [facility] [place] date: 10/25/23   Medication will be filled on 10/24/23.   Negative pregnancy test 10/19/23.   REMS Authorization Code [id number]. Expiration 10/30/23

## 2023-10-24 ENCOUNTER — Other Ambulatory Visit (HOSPITAL_COMMUNITY): Payer: Self-pay

## 2023-10-24 ENCOUNTER — Other Ambulatory Visit: Payer: Self-pay

## 2023-10-24 NOTE — Progress Notes (Signed)
 Per Tawanna Cooler ok to ship 3/12 to [address], [place]

## 2023-10-26 ENCOUNTER — Ambulatory Visit: Admitting: Internal Medicine

## 2023-10-26 NOTE — Progress Notes (Deleted)
 Name: Zoe Roach  MRN/ DOB: [id number], 1991-04-04    Age/ Sex: 33 y.o., female     PCP: Leilani Able, MD   Reason for Endocrinology Evaluation: Hypothyroidism     Initial Endocrinology Clinic Visit: 08/25/2020    PATIENT IDENTIFIER: [name] is a 33 y.o., female with a past medical history of HTN , IV drug abuse, Pulmonary HTN and RV failure. She has followed with [facility] since 08/25/2020 for consultative assistance with management of her Elevated catecholamines   HISTORICAL SUMMARY:   During evaluation for HTN/ Pulmonary hypertension and RV failure she was noted to have a slightly elevated (06/2020)  plasma  normetanephrine's 213.9 with normal plasma metanephrine.  Urinary dopamine, epinephrines, metanephrines and normetanephrines have all come back normal with slight elevation of norepinephrine at 141  ug ( reference 0-135 )   Aldo and renin are normal at 2.8 and 1.031 respectively.  Repeat 24-hr urine catecholamine and cortisol were all normal by 08/2020     THYROID HISTORY:   She has been diagnosed with hypothyroidism in the summer of 2021 , she was started On  levothyroxine at the time.    She was subsequently diagnosed with multinodular goiter in April 2023.  Thyroid ultrasound showed multiple thyroid nodules with 2 nodules meeting FNA criteria  She is s/p FNA of right inferior nodule  12/21/2021 with cytology report consistent of follicular lesion of undetermined significance (Bethesda category III) , Afirma was benign  She is s/p FNA of the left inferior nodule 5/10/2023with cytology report consistent of follicular lesion of undetermined significance (Bethesda category III) , Afirma was SUSPICIOUS   Patient was referred for surgical intervention 01/2022   She is S/P total thyroidectomy 02/06/2023, with pathology report consistent with adenomatous nodules.  Diffuse lymphocytic thyroiditis (Hashimoto's)   SUBJECTIVE:    Today  (10/26/2023):  Zoe Roach is here for hypothyroidism and MNG   She is s/p total thyroidectomy 01/2023 She continues to follow with Cardiology for pulmonary HTN  Has noted hoarseness since sx  She denies any cramps but had noted tingling of hands and peri-oral, took a few calcium tabs , symptoms resolved  Denies palpitations  Denies tremors  Denies constipation or diarrhea     Levothyroxine 175 mcg daily     HISTORY:  Past Medical History:  Past Medical History:  Diagnosis Date   Anxiety    a.) on BZO (alprazolam) PRN   CHF (congestive heart failure) (HCC) 06/18/2020   a.) TTE 06/18/2020: EF 50-55%, sev LA dil with bowing of IAS towards LA, mod-sev TR, triv MR, G2DD; b.) TTE 10/05/2020: EF 60-65%, mod red RVSF (EF 36%), mod RVE; c.) TTE 10/05/2021: EF 55-60%, mild RVE; d.) TTE 11/23/2022: EF 55-60%, mild RVE, triv MR   Depression    Dyspnea    GERD (gastroesophageal reflux disease)    HCV antibody positive    was reactive in 2021 and then rechecked and was not reactive   History of 2019 novel coronavirus disease (COVID-19) 06/09/2019   History of heroin use    History of narcotic addiction (HCC)    Hypothyroidism (acquired)    IVDU (intravenous drug user)    Left posterior fascicular block (LPFB)    Left renal artery stenosis (HCC) 06/19/2020   a.) renal US 06/19/2020: 1-59% stenosis LEFT renal artery   Migraines    Multinodular goiter    Pulmonary HTN (HCC) 06/18/2020   a.) Tx'd with ambrisentan + selexipag + sotatercept +  tadalafil; b.) TTE 06/18/20: EF 50-55, PA 4 cm, RVSP 108.7; c.) RHC 06/21/20: mRA 6, mPA 74, mPCWP 12, PA sat 54, AO sat 98, CO 2.64, CI 1.41, PVR 23; d.)TTE 10/05/20: EF 60-65, PASP 53.4; e.)TTE 10/05/21: EF 55-60, PASP 31.5; f.)TTE 11/23/22: EF 55-60, PASP 38.8; g.)RHC 12/08/22: mRA 2, mPA 32, mPCWP 6, PA sat 70, AO sat 97, CO 4.66, CI 2.5, PVR 5.6   Tobacco use    Past Surgical History:  Past Surgical History:  Procedure Laterality Date   CESAREAN SECTION      RIGHT HEART CATH N/A 06/21/2020   Procedure: RIGHT HEART CATH;  Surgeon: Laurey Morale, MD;  Location: [facility] INVASIVE CV LAB;  Service: Cardiovascular;  Laterality: N/A;   RIGHT HEART CATH N/A 12/08/2022   Procedure: RIGHT HEART CATH;  Surgeon: Laurey Morale, MD;  Location: [facility] INVASIVE CV LAB;  Service: Cardiovascular;  Laterality: N/A;   THYROIDECTOMY Bilateral 02/06/2023   Procedure: TOTAL THYROIDECTOMY;  Surgeon: Linus Salmons, MD;  Location: [facility] ORS;  Service: ENT;  Laterality: Bilateral;   WISDOM TOOTH EXTRACTION     Social History:  reports that she has been smoking cigarettes. She has a 7.5 pack-year smoking history. She has never used smokeless tobacco. She reports current alcohol use. She reports current drug use. Family History:  Family History  Problem Relation Age of Onset   Hypertension Paternal Grandfather    Hypertension Mother      HOME MEDICATIONS: Allergies as of 10/26/2023       Reactions   Almond (diagnostic) Hives   Cherry Hives   Diltiazem Other (See Comments)   Swollen face   Losartan Swelling   Peach [prunus Persica] Hives   Metoprolol Rash        Medication List        Accurate as of October 26, 2023  7:47 AM. If you have any questions, ask your nurse or doctor.          ALPRAZolam 1 MG tablet Commonly known as: XANAX Take 1 mg by mouth 3 (three) times daily as needed for anxiety or sleep.   ambrisentan 10 MG tablet Commonly known as: LETAIRIS Take 1 tablet (10 mg total) by mouth daily.   amLODipine 5 MG tablet Commonly known as: NORVASC TAKE 1 TABLET (5 MG TOTAL) BY MOUTH DAILY.   amphetamine-dextroamphetamine 15 MG tablet Commonly known as: ADDERALL Take 1 tablet by mouth daily.   Aspirin-Acetaminophen-Caffeine 500-325-65 MG Pack Take 1 Package by mouth daily as needed (Headache).   buPROPion 150 MG 12 hr tablet Commonly known as: Wellbutrin SR Take 1 tablet (150 mg total) by mouth 2 (two) times daily.   cetirizine 10  MG tablet Commonly known as: ZYRTEC Take 10 mg by mouth at bedtime.   cyclobenzaprine 10 MG tablet Commonly known as: FLEXERIL Take 10 mg by mouth daily as needed for muscle spasms.   famotidine 40 MG tablet Commonly known as: PEPCID Take 40 mg by mouth every morning.   furosemide 40 MG tablet Commonly known as: LASIX Take 0.5 tablets (20 mg total) by mouth daily.   GoodSense Pain Relief Extra St 500 MG tablet Generic drug: acetaminophen Take by mouth.   HYDROcodone-acetaminophen 5-325 MG tablet Commonly known as: NORCO/VICODIN Take 1-2 tablets by mouth every 6 (six) hours as needed.   ibuprofen 200 MG tablet Commonly known as: ADVIL Take 400 mg by mouth every 6 (six) hours as needed for headache.   lansoprazole 30 MG capsule Commonly known  as: PREVACID Take 30 mg by mouth daily.   levonorgestrel 20 MCG/24HR IUD Commonly known as: MIRENA 1 each by Intrauterine route once.   levothyroxine 175 MCG tablet Commonly known as: SYNTHROID Take 1 tablet (175 mcg total) by mouth daily.   meloxicam 7.5 MG tablet Commonly known as: MOBIC Take 7.5 mg by mouth daily.   montelukast 10 MG tablet Commonly known as: SINGULAIR Take 10 mg by mouth at bedtime.   Oyster Shell Calcium w/D 500-5 MG-MCG Tabs Take 2 tablets by mouth 2 (two) times daily with a meal.   predniSONE 10 MG tablet Commonly known as: DELTASONE Take 10 mg by mouth 3 (three) times daily.   tadalafil (PAH) 20 MG tablet Commonly known as: ADCIRCA Take 2 tablets (40 mg total) by mouth daily.   Uptravi 1400 MCG Tabs Generic drug: Selexipag Take 1,400 mcg by mouth in the morning and at bedtime.   Winrevair subcutaneous injection Generic drug: sotatercept Inject into the skin.   Winrevair Kit subcutaneous injection Generic drug: sotatercept INJECT 1.1 ML SUBCUTANEOUSLY ONCE EVERY 3 WEEKS          OBJECTIVE:   PHYSICAL EXAM: VS: There were no vitals taken for this visit.   EXAM: General: Pt  appears well and is in NAD  Neck: General: Supple without adenopathy. Thyroid: Thyroid size normal.  No goiter or nodules appreciated.   Lungs: Clear with good BS bilat   Heart: Auscultation: RRR.  Abdomen: soft, nontender  Extremities:  BL LE: No pretibial edema normal ROM and strength.     DATA REVIEWED:    ASSESSMENT / PLAN / RECOMMENDATIONS:  Postoperative hypothyroidism :    -S/p total thyroidectomy 01/2023 with benign pathology -No local neck symptoms  -Repeat TSH is  elevated, will increase levothyroxine and recheck in 2 months   Medications :   Start levothyroxine 175 mcg daily     F/U in 6 months     Signed electronically by: Lyndle Herrlich, MD  [facility] Endocrinology  [facility] [address] [place] [place], [place] [address] Phone: [phone] FAX: [phone]      CC: Leilani Able, MD [address] [place] [address] Phone: [phone]  Fax: [phone]   Return to Endocrinology clinic as below: Future Appointments  Date Time Provider Department Center  10/26/2023  2:00 PM Cesily Cuoco, Konrad Dolores, MD [facility] None  12/04/2023 10:00 AM [facility] OP 1 [facility]  12/04/2023 11:00 AM Laurey Morale, MD [facility] None

## 2023-11-05 ENCOUNTER — Other Ambulatory Visit (HOSPITAL_COMMUNITY): Payer: Medicaid Other

## 2023-11-05 ENCOUNTER — Other Ambulatory Visit: Payer: Self-pay

## 2023-11-05 ENCOUNTER — Encounter (HOSPITAL_COMMUNITY): Payer: Medicaid Other | Admitting: Cardiology

## 2023-11-08 NOTE — Telephone Encounter (Signed)
 Encounter opened in error

## 2023-11-12 ENCOUNTER — Other Ambulatory Visit: Payer: Self-pay

## 2023-11-14 ENCOUNTER — Other Ambulatory Visit: Payer: Self-pay

## 2023-11-15 ENCOUNTER — Other Ambulatory Visit: Payer: Self-pay

## 2023-11-16 ENCOUNTER — Other Ambulatory Visit: Payer: Self-pay

## 2023-11-19 ENCOUNTER — Other Ambulatory Visit (HOSPITAL_COMMUNITY): Payer: Self-pay

## 2023-11-21 ENCOUNTER — Other Ambulatory Visit: Payer: Self-pay

## 2023-11-22 ENCOUNTER — Other Ambulatory Visit: Payer: Self-pay

## 2023-11-23 ENCOUNTER — Other Ambulatory Visit: Payer: Self-pay

## 2023-11-23 NOTE — Progress Notes (Signed)
 Specialty Pharmacy Refill Coordination Note  Zoe Roach is a 33 y.o. female contacted today regarding refills of specialty medication(s) Ambrisentan [name]   Patient requested Delivery   Delivery date: 11/28/23   Verified address: [address]  [place] [address]   Medication will be filled on 11/27/23.

## 2023-11-24 ENCOUNTER — Other Ambulatory Visit (HOSPITAL_COMMUNITY): Payer: Self-pay

## 2023-11-27 ENCOUNTER — Other Ambulatory Visit: Payer: Self-pay

## 2023-11-27 ENCOUNTER — Other Ambulatory Visit (HOSPITAL_COMMUNITY): Payer: Self-pay

## 2023-12-03 ENCOUNTER — Telehealth (HOSPITAL_COMMUNITY): Payer: Self-pay | Admitting: Cardiology

## 2023-12-03 NOTE — Telephone Encounter (Signed)
 Called to confirm/remind patient of [facility]25.   Appointment:   [] Confirmed  [x] Left mess   [] No answer/No voice mail  [] VM Full/unable to leave message  [] Phone not in service  Patient reminded to bring all medications and/or complete list.  Confirmed patient has transportation. Gave directions, instructed to utilize valet parking.

## 2023-12-04 ENCOUNTER — Encounter (HOSPITAL_COMMUNITY): Payer: Self-pay | Admitting: Cardiology

## 2023-12-04 ENCOUNTER — Ambulatory Visit (HOSPITAL_COMMUNITY)
Admission: RE | Admit: 2023-12-04 | Discharge: 2023-12-04 | Disposition: A | Discharge status: Home / Self Care | Payer: Medicaid Other | Source: Ambulatory Visit | Attending: Cardiology | Admitting: Cardiology

## 2023-12-04 ENCOUNTER — Ambulatory Visit (HOSPITAL_BASED_OUTPATIENT_CLINIC_OR_DEPARTMENT_OTHER)
Admission: RE | Admit: 2023-12-04 | Discharge: 2023-12-04 | Disposition: A | Discharge status: Home / Self Care | Payer: Medicaid Other | Source: Ambulatory Visit | Attending: Cardiology | Admitting: Cardiology

## 2023-12-04 ENCOUNTER — Telehealth (HOSPITAL_COMMUNITY): Payer: Self-pay | Admitting: Pharmacist

## 2023-12-04 VITALS — BP 110/70 | HR 78 | Wt 182.6 lb

## 2023-12-04 DIAGNOSIS — Z79899 Other long term (current) drug therapy: Secondary | ICD-10-CM | POA: Insufficient documentation

## 2023-12-04 DIAGNOSIS — F1120 Opioid dependence, uncomplicated: Secondary | ICD-10-CM | POA: Diagnosis not present

## 2023-12-04 DIAGNOSIS — I2721 Secondary pulmonary arterial hypertension: Secondary | ICD-10-CM | POA: Insufficient documentation

## 2023-12-04 DIAGNOSIS — Z79890 Hormone replacement therapy (postmenopausal): Secondary | ICD-10-CM | POA: Insufficient documentation

## 2023-12-04 DIAGNOSIS — F17200 Nicotine dependence, unspecified, uncomplicated: Secondary | ICD-10-CM | POA: Insufficient documentation

## 2023-12-04 DIAGNOSIS — Z8616 Personal history of COVID-19: Secondary | ICD-10-CM | POA: Diagnosis not present

## 2023-12-04 DIAGNOSIS — R768 Other specified abnormal immunological findings in serum: Secondary | ICD-10-CM | POA: Insufficient documentation

## 2023-12-04 DIAGNOSIS — I509 Heart failure, unspecified: Secondary | ICD-10-CM | POA: Diagnosis not present

## 2023-12-04 DIAGNOSIS — I272 Pulmonary hypertension, unspecified: Secondary | ICD-10-CM | POA: Diagnosis not present

## 2023-12-04 DIAGNOSIS — I110 Hypertensive heart disease with heart failure: Secondary | ICD-10-CM | POA: Insufficient documentation

## 2023-12-04 DIAGNOSIS — B1920 Unspecified viral hepatitis C without hepatic coma: Secondary | ICD-10-CM | POA: Diagnosis not present

## 2023-12-04 DIAGNOSIS — G8929 Other chronic pain: Secondary | ICD-10-CM | POA: Diagnosis not present

## 2023-12-04 LAB — BASIC METABOLIC PANEL WITH GFR
Anion gap: 8 (ref 5–15)
BUN: 16 mg/dL (ref 6–20)
CO2: 24 mmol/L (ref 22–32)
Calcium: 9.3 mg/dL (ref 8.9–10.3)
Chloride: 106 mmol/L (ref 98–111)
Creatinine, Ser: 1.02 mg/dL — ABNORMAL HIGH (ref 0.44–1.00)
GFR, Estimated: >60 mL/min (ref >60)
Glucose, Bld: 98 mg/dL (ref 70–99)
Potassium: 4.6 mmol/L (ref 3.5–5.1)
Sodium: 138 mmol/L (ref 135–145)

## 2023-12-04 LAB — CBC
HCT: 47.4 % — ABNORMAL HIGH (ref 36.0–46.0)
Hemoglobin: 15.5 g/dL — ABNORMAL HIGH (ref 12.0–15.0)
MCH: 29.5 pg (ref 26.0–34.0)
MCHC: 32.7 g/dL (ref 30.0–36.0)
MCV: 90.3 fL (ref 80.0–100.0)
Platelets: 179 10*3/uL (ref 150–400)
RBC: 5.25 MIL/uL — ABNORMAL HIGH (ref 3.87–5.11)
RDW: 14.6 % (ref 11.5–15.5)
WBC: 5.7 10*3/uL (ref 4.0–10.5)
nRBC: 0 % (ref 0.0–0.2)

## 2023-12-04 LAB — ECHOCARDIOGRAM COMPLETE
Area-P 1/2: 3.54 cm2
S' Lateral: 2.7 cm

## 2023-12-04 LAB — BRAIN NATRIURETIC PEPTIDE: B Natriuretic Peptide: 15.2 pg/mL (ref 0.0–100.0)

## 2023-12-04 MED ORDER — WINREVAIR 45 MG ~~LOC~~ KIT: PACK | SUBCUTANEOUS | Status: AC

## 2023-12-04 NOTE — Telephone Encounter (Signed)
 Received message from Dr. Mitzie Anda that patient needs to restart Winrevair . Will have her start Winrevair  0.3 mg/kg [place] every 21 weeks, which corresponds to 0.5 mgL every 3 weeks. Will stay at this dose instead of increasing to the maintenance dose of 0.7 mg/kg since she previously had significant joint pain at the higher dose.   Of note, her baseline CBC today showed a HgB of 15.5. Per Dr. Mitzie Anda, ok to start Winrevair , but she will need to discontinue if repeat CBC increases above 17.0.   I called [facility] and provided them verbally with the new prescription. They will reach out to the patient for shipment. Also updated medication list.    Zoe Roach, PharmD, BCPS, BCCP, CPP [facility] Pharmacist [phone]

## 2023-12-04 NOTE — Progress Notes (Signed)
 PCP: Danella Dunn, MD Cardiology: Dr. Mitzie Anda  Chief complaint: pulmonary hypertension  33 y.o. with history of pulmonary hypertension and systemic hypertension presents for hospital followup after recent admission with RV failure. Patient also has prior history of IV drug use and COVID-19 infection roughly 1 year ago.  She was referred to general cardiology in 11/21 for evaluation of hypertension and abnormal EKG with findings consistent with right ventricular hypertrophy and RV strain.  She was initially placed on losartan  for systemic hypertension but this was discontinued due to facial swelling concerning for angioedema. She was tried on diltiazem  CD but also had trouble with this medication as well as Toprol  XL.  She also complained of persistent dyspnea that had been present over the last year, following her COVID-19 infection.  Dyspnea gradually worsened over time to the point where it was present with minimal exertion.  Echo was done at the [facility] office in 11/21, showing EF 50-55%, mild LVH, D-shaped septum, severe RV enlargement, moderately decreased RV systolic function, moderate-severe TR, PASP 109, severe RAE.  Based on these findings, she was admitted to [facility] for evaluation of RV failure.    She was volume overloaded on exam and was diuresed with IV Lasix  with significant improvement in her breathing.  RHC showed severe pulmonary arterial hypertension and low cardiac output.  V/Q scan and CTA chest were not suggestive of acute or chronic PE, PFTs were normal. Rheumatologic serologies and HIV were negative.  She was started on ambrisentan  and tadalafil  in the hospital.   In terms of her systemic HTN, were were concerned about secondary causes given her young age.  Renal artery dopplers did not show significant stenosis (worried about FMD), renin/aldosterone ratio was normal.  Plasma normetanephrine was elevated but 24 hour urine catecholamines were normal. She saw an endocrinologist and  is not thought to have pheochromocytoma.   HCV antibody was normal but RNA not detected.  Abdominal US  showed normal liver and LFTs were normal. She has had exposure to HCV in the past and used to use IV drugs.   She had an urticarial rash in the hospital of uncertain etiology (was present at admission), she got Solumedrol and this has resolved.   Echo 2/22 showed EF 60-65%, moderate RV dilation with moderately decreased systolic function, D-shaped septum, PASP 53 mmHg (down from 109 mmHg on last echo), normal IVC.   Echo in 2/23 showed EF 55-60%, mild RV enlargement with normal RV systolic function, PASP 31.5 mmHg, normal IVC.  RHC in 4/24 showed mild pulmonary hypertension but this was significantly improved from prior.    Echo was done today and reviewed, EF 55-60%, normal RV size and systolic function, IVC normal, PASP 34 mmHg.    Here today for follow up of pulmonary hypertension. She developed severe joint pain in her fingers on sotatercept 0.7 and stopped it. The pain resolved.  She is doing well symptomatically.  No significant exertional dyspnea.  She is tolerating current Uptravi  dose without headaches.  No chest pain.  No orthopnea/PND.  No lightheadedness.  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 Weight up 4 lbs.   ECG (personally reviewed): NSR, right axis deviation  6 minute walk (11/21): 427 meters 6 minute walk (12/21): 396.2 meters 6 minute walk (5/22): 518 meters 6 minute walk (11/22): 549 meters 6 minute walk (2/23): 457 meters (but she was in flip flops) 6 minute walk (6/23): 518 meters 6 minute walk (4/25): 640 meters  Labs (11/21): K 5, creatinine 1.16, RF mildly elevated 14, anti-CCP negative, HCV ab+ but RNA not detected, EBV IgG+ but IgM-, LFTs normal, TSH 22 with free T3 and free T4 normal, anti-SCL70 negative, anti-centromere negative, plasma normetanephrine elevated but 24 hour urine catecholeamines normal, PRA/PAC normal, ANA negative, HIV  negative, BNP 905.  Labs (12/21): K 3.7, creatinine 1.0, Digoxin  0.6, BNP 40, serum pregnancy test: negative Labs (4/22): K 3.6, creatinine 1.02 Labs (5/22): digoxin  0.3, BNP 32, K 3.6, creatinine 1.15 Labs (2/23): K 4.2, creatinine 0.98, BNP 12.9 Labs (6/23): K 4.3, creatinine 1.2, BNP 12.5  PMH: 1. HTN - Renal artery dopplers without hemodynamically significant stenosis.  - Serum normetanephrine elevated but 24 hour urine catecholamines normal => saw endocrinologist, not thought to have pheochromocytoma.  - PRA/PAC ration normal.  2. Hypothyroidism 3. Prior h/o IVDU 4. Active smoker 5. Pulmonary hypertension: Suspect primary pulmonary hypertension.  - RHC (11/21): mean RA 6, PA 104/56 mean 74, mean PCWP 12, CI 1.41, PVR 23 - Echo (11/21): EF 50-55%, mild LVH, D-shaped septum, severe RV enlargement, moderately decreased RV systolic function, moderate-severe TR, PASP 109, severe RAE.  - V/Q scan (11/21) negative - CTA chest (11/21): No PE or ILD.  - PFTs (11/21): normal - Rheumatologic serologic workup negative, LFTs normal, HIV negative.  - Echo (2/22): EF 60-65%, moderate RV dilation with moderately decreased systolic function (RV EF 36%), D-shaped septum, PASP 53 mmHg (down from 109 mmHg on last echo), normal IVC.  - Negative sleep study.  - Echo (2/23): EF 55-60%, mild RV enlargement with normal RV systolic function, PASP 31.5 mmHg, normal IVC. - RHC (4/24): mean RA 2, PA 47/22 mean 32, mean PCWP 6, PVR 5.6 WU, CI 2.5 - Echo (4/25): EF 55-60%, normal RV size and systolic function, IVC normal, PASP 34 mmHg.  6. HCV antibody positive: RNA not detected.  Abdominal US  showed normal liver.  7. COVID-19 infection 2020 8. Thyroid  nodule: s/p thyroidectomy  SH: Married, current smoker, prior IVDU. Occasional ETOH.   Family History  Problem Relation Age of Onset   Hypertension Paternal Grandfather    Hypertension Mother    ROS: All systems reviewed and negative except as per HPI.    Current Outpatient Medications  Medication Sig Dispense Refill   ALPRAZolam  (XANAX ) 1 MG tablet Take 1 mg by mouth 3 (three) times daily as needed for anxiety or sleep.     ambrisentan  (LETAIRIS ) 10 MG tablet Take 1 tablet (10 mg total) by mouth daily. 30 tablet 11   amLODipine  (NORVASC ) 5 MG tablet TAKE 1 TABLET (5 MG TOTAL) BY MOUTH DAILY. 90 tablet 2   amphetamine-dextroamphetamine (ADDERALL) 15 MG tablet Take 1 tablet by mouth daily.     Aspirin -Acetaminophen -Caffeine 500-325-65 MG PACK Take 1 Package by mouth daily as needed (Headache).     cetirizine (ZYRTEC) 10 MG tablet Take 10 mg by mouth at bedtime.     cyclobenzaprine (FLEXERIL) 10 MG tablet Take 10 mg by mouth daily as needed for muscle spasms.     famotidine (PEPCID) 40 MG tablet Take 40 mg by mouth every morning.     furosemide  (LASIX ) 40 MG tablet Take 0.5 tablets (20 mg total) by mouth daily. 45 tablet 7   GOODSENSE PAIN RELIEF EXTRA ST 500 MG tablet Take by mouth.     ibuprofen (ADVIL) 200 MG tablet Take 400 mg by mouth every 6 (six) hours as needed for headache.     lansoprazole (PREVACID) 30 MG capsule Take  30 mg by mouth daily.     levonorgestrel (MIRENA) 20 MCG/24HR IUD 1 each by Intrauterine route once.     levothyroxine  (SYNTHROID ) 175 MCG tablet Take 1 tablet (175 mcg total) by mouth daily. 90 tablet 3   montelukast (SINGULAIR) 10 MG tablet Take 10 mg by mouth at bedtime.     Selexipag  (UPTRAVI ) 1400 MCG TABS Take 1,400 mcg by mouth in the morning and at bedtime. 60 tablet 11   tadalafil , PAH, (ADCIRCA ) 20 MG tablet Take 2 tablets (40 mg total) by mouth daily. 180 tablet 3   sotatercept (WINREVAIR ) subcutaneous injection Inject 0.3 mg/kg (0.5 mL) into the skin every 21 ( twenty-one) days. Do not up titrate. Dosing weight 82.8 kg.     No current facility-administered medications for this encounter.    BP 110/70   Pulse 78   Wt 82.8 kg (182 lb 9.6 oz)   SpO2 93%   BMI 30.39 kg/m    Wt Readings from Last 3  Encounters:  12/04/23 82.8 kg (182 lb 9.6 oz)  05/14/23 82.6 kg (182 lb)  02/06/23 80 kg (176 lb 6.4 oz)  General: NAD Neck: No JVD, no thyromegaly or thyroid  nodule.  Lungs: Clear to auscultation bilaterally with normal respiratory effort. CV: Nondisplaced PMI.  Heart regular S1/S2, no S3/S4, no murmur.  No peripheral edema.  No carotid bruit.  Normal pedal pulses.  Abdomen: Soft, nontender, no hepatosplenomegaly, no distention.  Skin: Intact without lesions or rashes.  Neurologic: Alert and oriented x 3.  Psych: Normal affect. Extremities: No clubbing or cyanosis.  HEENT: Normal.   Assessment/Plan:  1. Pulmonary hypertension/RV failure: Echo 11/21 with normal LV size with mild LV hypertrophy, EF 50-55%,  D-shaped septum due to RV pressure/volume overload, severely dilated RV with moderately decreased RV systolic function, PASP 109 mmHg, dilated IVC. She had been symptomatic for months prior to this, suspect due to gradually worsening RV failure.  CTA chest did not show significant lung pathology and did not show PE and V/Q scan negative. PFTs normal. No diet/weight loss drugs.  HIV negative and rheumatologic serologies negative.  HCV antibody positive but liver normal on US  and LFTs normal.  RHC in 11/21 showed severe pulmonary arterial hypertension with low cardiac output and normal filling pressures.  She will need aggressive treatment of PAH and given low output, may need IV therapy eventually.  Echo in 2/22 showed moderate RV dilation with moderate RV dysfunction, but PASP down to 53 mmHg (109 mmHg on prior echo).  Echo in 2/23 with further improvement, EF 55-60%, mild RV enlargement with normal RV systolic function, PASP 31.5 mmHg, normal IVC.  RHC was repeated in 4/24 and showed significant improvement with mean PA 32 with PVR 5.6 WU and normal CO.  Echo today showed EF 55-60%, normal RV, PASP 34.  Suspect primary pulmonary hypertension. Sleep study negative. She is doing well, NYHA class I,  not volume overloaded on exam, excellent 6 minute walk today.  - Continue Lasix  20 mg daily, does not want to stop it due to occasional "swelling."  BMET/BNP today.      - Continue tadalafil  40 mg daily. - Continue ambrisentan  10 mg daily today.  She has IUD and gets monthly pregnancy tests.  - Selexipag  1400 mcg bid.  Unable to take higher dose due to headaches and diarrhea.   - She had joint pain in her hands with sotatercept 0.7 that resolved when it was stopped.  She is willing to retry this  at 0.3.  Given mild residual pulmonary hypertension on last RHC, I think this is reasonable. If joint pain recurs on lower dose sotatercept and she has to stop it, she would be willing to try to go up again on Uptravi .  Check CBC today and follow while on sotatercept.  2. Systemic HTN: Patient has had systemic HTN for some time.  She has a strong family history of this. She thinks losartan  and diltiazem  caused facial swelling so has stopped them.  She thinks metoprolol  caused a rash.  Concern for secondary HTN given young age (and family history).  Renal artery dopplers in 11/21 without evidence for hemodynamically significant stenosis.  Plasma renin and aldosterone ratio normal.  Serum normetanephrine elevated at 213 pg/mL with normal metanephrine.  This can be a false positive.  Since 24 hour urine catecholamines were negative, suspect not pheochromocytoma. BP now controlled.    - Continue amlodipine  5 mg daily. 3. Hypothyroidism: Continue Levoxyl , followed by endocrinology.   4. 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 She has quit.  5. Hep C: HCV ab reactive. H/o IVDU and has had prior exposure to HCV. Abdominal US  without cirrhosis and LFTs normal.  HCV RNA was negative => suspect infection was cleared.  6. Fatigue/sleepiness: Negative sleep study. 7. Active smoker: She is rarely smoking now.    Followup in 3 months  I spent 32 minutes reviewing records, interviewing/examining patient,  and managing orders.    Peder Bourdon 12/04/2023

## 2023-12-04 NOTE — Patient Instructions (Addendum)
 We are restarting Winvenair at the lower dose.  Labs done today, your results will be available in MyChart, we will contact you for abnormal readings.  Your physician recommends that you schedule a follow-up appointment in: 3 months (July) ** PLEASE CALL THE OFFICE IN MAY TO ARRANGE YOUR FOLLOW UP APPOINTMENT.**  If you have any questions or concerns before your next appointment please send us  a message through Craig or call our office at [phone].    TO LEAVE A MESSAGE FOR THE NURSE SELECT OPTION 2, PLEASE LEAVE A MESSAGE INCLUDING: YOUR NAME DATE OF BIRTH CALL BACK NUMBER REASON FOR CALL**this is important as we prioritize the call backs  YOU WILL RECEIVE A CALL BACK THE SAME DAY AS LONG AS YOU CALL BEFORE 4:00 PM  At the [facility], you and your health needs are our priority. As part of our continuing mission to provide you with exceptional heart care, we have created designated Provider Care Teams. These Care Teams include your primary Cardiologist (physician) and Advanced Practice Providers (APPs- Physician Assistants and Nurse Practitioners) who all work together to provide you with the care you need, when you need it.   You may see any of the following providers on your designated Care Team at your next follow up: Dr Jules Oar Dr Peder Bourdon Dr. Alwin Baars Dr. Arta Lark Amy Marijane Shoulders, NP Ruddy Corral, [place] [facility], [place] Dennise Fitz, NP [place] Lee, NP Shawnee Dellen, NP Luster Salters, PharmD Bevely Brush, PharmD   Please be sure to bring in all your medications bottles to every appointment.    Thank you for choosing [facility]

## 2023-12-04 NOTE — Progress Notes (Signed)
 6 Min Walk Test Completed  Pt ambulated 2100 ft (640.08 m) O2 Sat ranged 97%-91%  on room air HR ranged 83-125

## 2023-12-18 ENCOUNTER — Other Ambulatory Visit: Payer: Self-pay

## 2023-12-21 ENCOUNTER — Other Ambulatory Visit: Payer: Self-pay

## 2023-12-24 ENCOUNTER — Other Ambulatory Visit: Payer: Self-pay

## 2023-12-25 ENCOUNTER — Other Ambulatory Visit: Payer: Self-pay

## 2023-12-25 NOTE — Progress Notes (Signed)
 Specialty Pharmacy Refill Coordination Note  Zoe Roach is a 33 y.o. female contacted today regarding refills of specialty medication(s) Ambrisentan  (LETAIRIS )   Patient requested Delivery   Delivery date: 12/28/23   Verified address: [address]  [place] [address]   Medication will be filled on 12/27/23.

## 2023-12-25 NOTE — Progress Notes (Signed)
 Specialty Pharmacy Ongoing Clinical Assessment Note  Zoe Roach is a 33 y.o. female who is being followed by the specialty pharmacy service for RxSp Pulmonary Arterial Hypertension   Patient's specialty medication(s) reviewed today: Ambrisentan  (LETAIRIS )   Missed doses in the last 4 weeks: 1   Patient/Caregiver did not have any additional questions or concerns.   Therapeutic benefit summary: Patient is achieving benefit   Adverse events/side effects summary: No adverse events/side effects   Patient's therapy is appropriate to: Continue    Goals Addressed             This Visit's Progress    Maintain optimal adherence to therapy   On track    Patient is on track. Patient will maintain adherence      Maintain or improve exercise capacity (6 minute walk)   On track    Patient is on track. Patient will maintain adherence and be monitored by provider to determine if a change in treatment plan is warranted         Follow up: 6 months  Cicero Noy M Jerzee Jerome Specialty Pharmacist

## 2023-12-27 ENCOUNTER — Other Ambulatory Visit: Payer: Self-pay

## 2024-01-18 ENCOUNTER — Other Ambulatory Visit: Payer: Self-pay

## 2024-01-23 ENCOUNTER — Other Ambulatory Visit: Payer: Self-pay

## 2024-01-26 ENCOUNTER — Other Ambulatory Visit: Payer: Self-pay

## 2024-01-26 ENCOUNTER — Other Ambulatory Visit (HOSPITAL_COMMUNITY): Payer: Self-pay

## 2024-01-28 ENCOUNTER — Other Ambulatory Visit: Payer: Self-pay

## 2024-01-28 ENCOUNTER — Other Ambulatory Visit (HOSPITAL_COMMUNITY): Payer: Self-pay | Admitting: Cardiology

## 2024-02-05 ENCOUNTER — Telehealth (HOSPITAL_COMMUNITY): Payer: Self-pay | Admitting: Pharmacy Technician

## 2024-02-05 ENCOUNTER — Other Ambulatory Visit (HOSPITAL_COMMUNITY): Payer: Self-pay

## 2024-02-05 NOTE — Telephone Encounter (Signed)
 Advanced [facility] Failure Patient Advocate Encounter  Received notification from [facility] that the patient's enrollment in the [employer] access program will expire in July. Called [employer] to confirm if the patient will need to sign any new forms, since insurance is paying for the medication. The patient will not need to sign anything else and will continue to receive the medication (from [facility]), unless there is a financial barrier.  Test claim shows that patients [facility] Medicaid is active and paying for the medication. Co-pay is $4 per kit.  Almarie JULIANNA Pa, CPhT

## 2024-02-06 ENCOUNTER — Other Ambulatory Visit (HOSPITAL_COMMUNITY): Payer: Self-pay

## 2024-02-07 ENCOUNTER — Other Ambulatory Visit: Payer: Self-pay

## 2024-02-07 ENCOUNTER — Other Ambulatory Visit: Payer: Self-pay | Admitting: Pharmacy Technician

## 2024-02-07 NOTE — Progress Notes (Signed)
 Specialty Pharmacy Refill Coordination Note  Zoe Roach is a 33 y.o. female contacted today regarding refills of specialty medication(s) Ambrisentan  (LETAIRIS )   Patient requested Delivery   Delivery date: 02/08/24   Verified address: [address] [place]   Medication will be filled on 02/07/24.

## 2024-02-28 ENCOUNTER — Other Ambulatory Visit: Payer: Self-pay

## 2024-03-03 ENCOUNTER — Other Ambulatory Visit (HOSPITAL_COMMUNITY): Payer: Self-pay

## 2024-03-03 ENCOUNTER — Other Ambulatory Visit: Payer: Self-pay

## 2024-03-05 ENCOUNTER — Other Ambulatory Visit (HOSPITAL_COMMUNITY): Payer: Self-pay

## 2024-03-06 ENCOUNTER — Other Ambulatory Visit: Payer: Self-pay

## 2024-03-06 ENCOUNTER — Other Ambulatory Visit (HOSPITAL_COMMUNITY): Payer: Self-pay

## 2024-03-06 NOTE — Progress Notes (Signed)
 Specialty Pharmacy Refill Coordination Note  Zoe Roach is a 33 y.o. female contacted today regarding refills of specialty medication(s) Ambrisentan  (LETAIRIS )   Patient requested Delivery   Delivery date: 03/10/24   Verified address: [address] [place]   Medication will be filled on 03/07/24.

## 2024-03-28 ENCOUNTER — Other Ambulatory Visit: Payer: Self-pay

## 2024-03-28 NOTE — Progress Notes (Signed)
 Specialty Pharmacy Refill Coordination Note  Zoe Roach is a 33 y.o. female contacted today regarding refills of specialty medication(s) Ambrisentan JOE)   Patient requested Delivery   Delivery date: 04/01/24   Verified address: [address] [place]   Medication will be filled on 03/31/24.

## 2024-03-31 ENCOUNTER — Other Ambulatory Visit: Payer: Self-pay

## 2024-04-09 ENCOUNTER — Other Ambulatory Visit: Payer: Self-pay

## 2024-04-21 ENCOUNTER — Other Ambulatory Visit (HOSPITAL_COMMUNITY): Payer: Self-pay

## 2024-04-23 ENCOUNTER — Other Ambulatory Visit: Payer: Self-pay

## 2024-04-24 ENCOUNTER — Other Ambulatory Visit: Payer: Self-pay

## 2024-04-24 NOTE — Progress Notes (Signed)
 Specialty Pharmacy Refill Coordination Note  Zoe Roach is a 33 y.o. female contacted today regarding refills of specialty medication(s) Ambrisentan  (LETAIRIS )   Patient requested Delivery   Delivery date: 04/28/24   Verified address: [address] [place]   Medication will be filled on 04/25/24.

## 2024-04-25 ENCOUNTER — Other Ambulatory Visit (HOSPITAL_COMMUNITY): Payer: Self-pay

## 2024-04-25 ENCOUNTER — Other Ambulatory Visit: Payer: Self-pay

## 2024-04-28 ENCOUNTER — Other Ambulatory Visit (HOSPITAL_COMMUNITY): Payer: Self-pay

## 2024-04-29 ENCOUNTER — Telehealth (HOSPITAL_COMMUNITY): Payer: Self-pay

## 2024-04-29 ENCOUNTER — Other Ambulatory Visit (HOSPITAL_COMMUNITY): Payer: Self-pay

## 2024-04-29 NOTE — Telephone Encounter (Signed)
 Advanced [facility] Patient Advocate Encounter  Prior authorization for Uptravi  1400 mcg has been submitted and approved. Test billing returns refill too soon rejection. Unable to verify copay at this time.  [employer] MUST Effective: 04/29/2024 to 04/29/2025  Rachel DEL, CPhT Rx Patient Advocate Phone: [phone]

## 2024-05-08 ENCOUNTER — Other Ambulatory Visit: Payer: Self-pay | Admitting: Internal Medicine

## 2024-05-15 ENCOUNTER — Other Ambulatory Visit: Payer: Self-pay

## 2024-05-22 ENCOUNTER — Other Ambulatory Visit: Payer: Self-pay

## 2024-05-22 NOTE — Progress Notes (Signed)
 Specialty Pharmacy Refill Coordination Note  Zoe Roach is a 33 y.o. female contacted today regarding refills of specialty medication(s) Ambrisentan  (LETAIRIS )   Patient requested Delivery   Delivery date: 05/29/24   Verified address: [address] [name]   Medication will be filled on 05/28/24.

## 2024-05-28 ENCOUNTER — Other Ambulatory Visit: Payer: Self-pay

## 2024-06-01 ENCOUNTER — Other Ambulatory Visit: Payer: Self-pay | Admitting: Internal Medicine

## 2024-06-05 ENCOUNTER — Other Ambulatory Visit (HOSPITAL_COMMUNITY): Payer: Self-pay | Admitting: Cardiology

## 2024-06-20 ENCOUNTER — Other Ambulatory Visit (HOSPITAL_COMMUNITY): Payer: Self-pay

## 2024-06-20 ENCOUNTER — Other Ambulatory Visit: Payer: Self-pay

## 2024-06-20 ENCOUNTER — Other Ambulatory Visit (HOSPITAL_COMMUNITY): Payer: Self-pay | Admitting: Cardiology

## 2024-06-20 MED ORDER — AMBRISENTAN 10 MG PO TABS
10.0000 mg | ORAL_TABLET | Freq: Every day | ORAL | 2 refills | Status: DC
  Filled 2024-06-23: qty 30, 30d supply, fill #0
  Filled 2024-07-18: qty 30, 30d supply, fill #1
  Filled 2024-08-15: qty 30, 30d supply, fill #2

## 2024-06-20 NOTE — Progress Notes (Signed)
 Specialty Pharmacy Refill Coordination Note  Zoe Roach is a 33 y.o. female contacted today regarding refills of specialty medication(s) Ambrisentan  (LETAIRIS )   Patient requested Delivery   Delivery date: 06/27/24   Verified address: [address] [place]   Medication will be filled on: 06/26/24

## 2024-06-23 ENCOUNTER — Other Ambulatory Visit: Payer: Self-pay

## 2024-06-25 ENCOUNTER — Other Ambulatory Visit: Payer: Self-pay

## 2024-07-02 ENCOUNTER — Other Ambulatory Visit: Payer: Self-pay | Admitting: Internal Medicine

## 2024-07-18 ENCOUNTER — Other Ambulatory Visit (HOSPITAL_COMMUNITY): Payer: Self-pay

## 2024-07-21 ENCOUNTER — Other Ambulatory Visit (HOSPITAL_COMMUNITY): Payer: Self-pay

## 2024-07-22 ENCOUNTER — Other Ambulatory Visit (HOSPITAL_COMMUNITY): Payer: Self-pay

## 2024-07-22 ENCOUNTER — Other Ambulatory Visit: Payer: Self-pay

## 2024-07-22 NOTE — Progress Notes (Signed)
 Specialty Pharmacy Refill Coordination Note  Zoe Roach is a 33 y.o. female contacted today regarding refills of specialty medication(s) Ambrisentan  (LETAIRIS )   Patient requested Delivery   Delivery date: 07/25/24   Verified address: [address] [place]   Medication will be filled on: 07/24/24

## 2024-07-24 ENCOUNTER — Other Ambulatory Visit: Payer: Self-pay

## 2024-08-06 ENCOUNTER — Other Ambulatory Visit (HOSPITAL_COMMUNITY): Payer: Self-pay | Admitting: Cardiology

## 2024-08-15 ENCOUNTER — Other Ambulatory Visit: Payer: Self-pay

## 2024-08-19 ENCOUNTER — Other Ambulatory Visit: Payer: Self-pay

## 2024-08-19 NOTE — Progress Notes (Signed)
 Specialty Pharmacy Refill Coordination Note  Zoe Roach is a 34 y.o. female contacted today regarding refills of specialty medication(s) Ambrisentan  (LETAIRIS )   Patient requested Delivery   Delivery date: 08/22/24   Verified address: [address] [place], [address]   Medication will be filled on: 08/21/24

## 2024-08-21 ENCOUNTER — Other Ambulatory Visit: Payer: Self-pay

## 2024-09-12 ENCOUNTER — Other Ambulatory Visit: Payer: Self-pay

## 2024-09-12 ENCOUNTER — Other Ambulatory Visit (HOSPITAL_COMMUNITY): Payer: Self-pay | Admitting: Cardiology

## 2024-09-12 MED ORDER — AMBRISENTAN 10 MG PO TABS
10.0000 mg | ORAL_TABLET | Freq: Every day | ORAL | 0 refills | Status: AC
  Filled 2024-09-12 – 2024-09-15 (×2): qty 30, 30d supply, fill #0

## 2024-09-15 ENCOUNTER — Other Ambulatory Visit (HOSPITAL_COMMUNITY): Payer: Self-pay

## 2024-09-15 ENCOUNTER — Other Ambulatory Visit: Payer: Self-pay

## 2024-09-17 ENCOUNTER — Other Ambulatory Visit (HOSPITAL_COMMUNITY): Payer: Self-pay

## 2024-09-18 ENCOUNTER — Other Ambulatory Visit: Payer: Self-pay

## 2024-09-18 NOTE — Progress Notes (Signed)
 Specialty Pharmacy Refill Coordination Note  Zoe Roach is a 34 y.o. female contacted today regarding refills of specialty medication(s) Ambrisentan  (LETAIRIS )   Patient requested Delivery   Delivery date: 09/26/24   Verified address: [address] [place], [address]   Medication will be filled on: 09/25/24
